# Patient Record
Sex: Female | Born: 1985
Health system: Southern US, Community
[De-identification: ages and names within clinical notes are randomized; demographics above are authoritative.]

## PROBLEM LIST (undated history)

## (undated) DIAGNOSIS — I10 Essential (primary) hypertension: Secondary | ICD-10-CM

## (undated) DIAGNOSIS — R7303 Prediabetes: Secondary | ICD-10-CM

## (undated) HISTORY — PX: NO PAST SURGERIES: SHX2092

## (undated) HISTORY — DX: Morbid (severe) obesity due to excess calories: E66.01

---
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
## 2012-06-05 ENCOUNTER — Ambulatory Visit (INDEPENDENT_AMBULATORY_CARE_PROVIDER_SITE_OTHER): Payer: BC Managed Care – HMO | Admitting: Physician Assistant

## 2012-06-05 VITALS — BP 148/97 | HR 128 | Temp 102.7°F | Resp 16 | Ht 68.0 in | Wt 262.0 lb

## 2012-06-05 DIAGNOSIS — R509 Fever, unspecified: Secondary | ICD-10-CM

## 2012-06-05 DIAGNOSIS — J029 Acute pharyngitis, unspecified: Secondary | ICD-10-CM

## 2012-06-05 DIAGNOSIS — R030 Elevated blood-pressure reading, without diagnosis of hypertension: Secondary | ICD-10-CM

## 2012-06-05 LAB — POCT CBC
HCT, POC: 39.9 % (ref 37.7–47.9)
Lymph, poc: 2.7 (ref 0.6–3.4)
MCH, POC: 26 pg — AB (ref 27–31.2)
MCHC: 30.6 g/dL — AB (ref 31.8–35.4)
MCV: 84.9 fL (ref 80–97)
POC Granulocyte: 20.4 — AB (ref 2–6.9)
POC LYMPH PERCENT: 11.1 %L (ref 10–50)
RDW, POC: 17.3 %
WBC: 24.3 10*3/uL — AB (ref 4.6–10.2)

## 2012-06-05 LAB — POCT INFLUENZA A/B
Influenza A, POC: NEGATIVE
Influenza B, POC: NEGATIVE

## 2012-06-05 MED ORDER — IBUPROFEN 200 MG PO TABS: 400.0000 mg | ORAL_TABLET | Freq: Once | ORAL | Status: DC

## 2012-06-05 MED ORDER — AMOXICILLIN 875 MG PO TABS: 875.0000 mg | ORAL_TABLET | Freq: Two times a day (BID) | ORAL | Status: DC

## 2012-06-05 NOTE — Progress Notes (Signed)
   [address], [place] [address]   Phone [phone]  Subjective:    Patient ID: Dana Allison, female    DOB: [birth year], 27 y.o.   MRN: [id number]  HPI  Pt presents to clinic with 2 day h/o severe sore throat with fever and no congestion or cough.  She has myalgias and slight nausea. She has used no OTC medications, she did have 2 left pills of antibiotic but does not know what they were.  She has not had a flu vaccine or exposure to anyone with the flu or strep that she knows of.  She is drinking a little today but has been unable to eat due to pain.  Review of Systems  Constitutional: Positive for fever and chills.  HENT: Positive for sore throat. Negative for congestion and rhinorrhea.   Respiratory: Negative for cough.   Musculoskeletal: Positive for myalgias.       Objective:   Physical Exam  Vitals reviewed. Constitutional: She appears well-developed and well-nourished.  HENT:  Head: Normocephalic and atraumatic.  Right Ear: Hearing, tympanic membrane, external ear and ear canal normal.  Left Ear: Hearing, tympanic membrane, external ear and ear canal normal.  Nose: Nose normal. No mucosal edema.  Mouth/Throat: Oropharyngeal exudate, posterior oropharyngeal edema and posterior oropharyngeal erythema present. No tonsillar abscesses.  Eyes: Conjunctivae normal are normal.  Cardiovascular: Normal rate, regular rhythm and normal heart sounds.   No murmur heard. Pulmonary/Chest: Effort normal and breath sounds normal.  Lymphadenopathy:    She has cervical adenopathy (AC enlarged and TTP).   Results for orders placed in visit on 06/05/12  POCT RAPID STREP A (OFFICE)      Component Value Range   Rapid Strep A Screen Negative  Negative  POCT CBC      Component Value Range   WBC 24.3 (*) 4.6 - 10.2 K/uL   Lymph, poc 2.7  0.6 - 3.4   POC LYMPH PERCENT 11.1  10 - 50 %L   MID (cbc) 1.2 (*) 0 - 0.9   POC MID % 5.1  0 - 12 %M   POC Granulocyte 20.4 (*) 2 - 6.9   Granulocyte percent 83.8 (*) 37 - 80 %G   RBC 4.70  4.04 - 5.48 M/uL   Hemoglobin 12.2  12.2 - 16.2 g/dL   HCT, POC 84.1  32.4 - 47.9 %   MCV 84.9  80 - 97 fL   MCH, POC 26.0 (*) 27 - 31.2 pg   MCHC 30.6 (*) 31.8 - 35.4 g/dL   RDW, POC 40.1     Platelet Count, POC 285  142 - 424 K/uL   MPV 8.5  0 - 99.8 fL       Assessment & Plan:   1. Acute pharyngitis  POCT rapid strep A, POCT Influenza A/B, amoxicillin (AMOXIL) 875 MG tablet  2. Fever, unspecified  POCT rapid strep A, POCT CBC, ibuprofen (ADVIL,MOTRIN) tablet 400 mg   I suspect that patient has strep throat and will treat accordingly due to exam and elevated white count with granulocytic shift.  D/w pt symptomatic treatment.  Gave note for work for tomorrow.  Pt must try to push fluids.  If she is unable to drink she must RTC of IV hydration.  Answered patient's questions.  Pt to monitor her BP - it is elevated tonight but probably because of her feeling poorly.

## 2012-06-08 ENCOUNTER — Emergency Department (HOSPITAL_COMMUNITY)
Admission: EM | Admit: 2012-06-08 | Discharge: 2012-06-09 | Disposition: A | Discharge status: Home / Self Care | Payer: BC Managed Care – PPO | Attending: Emergency Medicine | Admitting: Emergency Medicine

## 2012-06-08 DIAGNOSIS — Z792 Long term (current) use of antibiotics: Secondary | ICD-10-CM | POA: Insufficient documentation

## 2012-06-08 DIAGNOSIS — F17200 Nicotine dependence, unspecified, uncomplicated: Secondary | ICD-10-CM | POA: Insufficient documentation

## 2012-06-08 DIAGNOSIS — H109 Unspecified conjunctivitis: Secondary | ICD-10-CM

## 2012-06-08 DIAGNOSIS — H5789 Other specified disorders of eye and adnexa: Secondary | ICD-10-CM | POA: Insufficient documentation

## 2012-06-08 MED ORDER — POLYMYXIN B-TRIMETHOPRIM 10000-0.1 UNIT/ML-% OP SOLN: 1.0000 [drp] | OPHTHALMIC | Status: DC

## 2012-06-08 NOTE — ED Provider Notes (Signed)
History  This chart was scribed for non-physician practitioner working with Dana Smitty Cords, MD by Erskine Emery, ED Scribe. This patient was seen in room WTR5/WTR5 and the patient's care was started at 23:37.   CSN: [id number]  Arrival date & time 06/08/12  2238   First MD Initiated Contact with Patient 06/08/12 2337      Chief Complaint  Patient presents with  . Eye Problem    (Consider location/radiation/quality/duration/timing/severity/associated sxs/prior Treatment) Dana Allison is a 27 y.o. female who presents to the Emergency Department complaining of gradually worsening left eye drainage, erythema, and irritation for the past 2 days.  Patient is a 27 y.o. female presenting with eye problem. The history is provided by the patient. No language interpreter was used.  Eye Problem  This is a new problem. The current episode started 2 days ago. The problem occurs constantly. The problem has been gradually worsening. The left eye is affected.There was no injury mechanism. The pain is mild. There is no history of trauma to the eye. There is no known exposure to pink eye. She does not wear contacts. Associated symptoms include discharge and eye redness. Pertinent negatives include no blurred vision, no decreased vision, no double vision, no foreign body sensation, no photophobia, no nausea, no vomiting and no weakness. Treatments tried: Visine eye drops. The treatment provided no relief.  Pt denies any associated eye pain, foreign body in the eye, and blurred or double vision. She reports she has recently been taking amoxicillin for a cold and possible strep throat. She also reports using visine eye drops for her eye. Pt knows of no exposure to pink eye.  No past medical history on file.  No past surgical history on file.  Family History  Problem Relation Age of Onset  . Hypertension Mother     History  Substance Use Topics  . Smoking status: Current Every Day Smoker -- 0.3  packs/day for 2 years  . Smokeless tobacco: Not on file  . Alcohol Use: No    OB History    Grav Para Term Preterm Abortions TAB SAB Ect Mult Living                  Review of Systems  Constitutional: Negative for fever and chills.  HENT: Negative for neck pain.   Eyes: Positive for discharge and redness. Negative for blurred vision, double vision, photophobia, pain and visual disturbance.  Respiratory: Negative for shortness of breath.   Gastrointestinal: Negative for nausea and vomiting.  Neurological: Negative for weakness.    Allergies  Review of patient's allergies indicates no known allergies.  Home Medications   Current Outpatient Rx  Name  Route  Sig  Dispense  Refill  . ACETAMINOPHEN 500 MG PO TABS   Oral   Take 1,000 mg by mouth every 6 (six) hours as needed.         . AMOXICILLIN 875 MG PO TABS   Oral   Take 875 mg by mouth 2 (two) times daily.           Triage Vitals: BP 143/92  Pulse 119  Temp 98 F (36.7 C) (Oral)  Resp 18  SpO2 98%  LMP 06/04/2012  Physical Exam  Nursing note and vitals reviewed. Constitutional: She is oriented to person, place, and time. She appears well-developed and well-nourished. No distress.  HENT:  Head: Normocephalic and atraumatic.  Eyes: EOM are normal. Pupils are equal, round, and reactive to light.  No periorbital swelling or erythema of either eye. Sclera on left eye appears to be diffusely injected. Clear drainage and crusting of lateral edge of left eye. Right eye is clear. No pain with extraocular movements.  Neck: Neck supple. No tracheal deviation present.  Cardiovascular: Normal rate, regular rhythm and normal heart sounds.   Pulmonary/Chest: Effort normal and breath sounds normal. No respiratory distress. She has no wheezes.  Abdominal: Soft. She exhibits no distension.  Musculoskeletal: Normal range of motion. She exhibits no edema.  Neurological: She is alert and oriented to person, place, and  time.  Skin: Skin is warm and dry.  Psychiatric: She has a normal mood and affect.    ED Course  Procedures (including critical care time) DIAGNOSTIC STUDIES: Oxygen Saturation is 98% on room air, normal by my interpretation.    COORDINATION OF CARE: 23:42--I evaluated the patient and we discussed a treatment plan including eye drops to which the pt agreed.  I told the pt that she should look out for swelling or redness around her eye, vision changes, and pain upon moving her eye.  23: 54--I rechecked the pt and inspected her eye for any foreign bodies. There are no foreign bodies present. Discussed performing a Flurosein stain to check for corneal abrasions.  Patient states that she is not having pain and does not feel that there was a foreign body.    Labs Reviewed - No data to display No results found.   No diagnosis found.    MDM  Patient presenting with drainage and erythema of the left eye.  Patient does not have any periorbital erythema or edema.  Patient denies pain of her eye or foreign body sensation.  Normal visual acuity.  Appearance of the eye and discharge most consistent with Acute Bacterial Conjunctivitis.  Patient given prescription for antibiotic eye drops.  Return precautions discussed with the patient.    I personally performed the services described in this documentation, which was scribed in my presence. The recorded information has been reviewed and is accurate.      Pascal Lux [place], PA-C 06/09/12 1130

## 2012-06-08 NOTE — ED Notes (Signed)
Pt c/o L eye pain and drainage for 2 days. Pt states pain and drainage getting worse. Pt has redness to L eye. Pt reports that she is taking amoxicillin which she started on Thursday.

## 2012-06-11 ENCOUNTER — Telehealth: Payer: Self-pay

## 2012-06-11 ENCOUNTER — Emergency Department (HOSPITAL_COMMUNITY)
Admission: EM | Admit: 2012-06-11 | Discharge: 2012-06-11 | Disposition: A | Discharge status: Home / Self Care | Payer: BC Managed Care – PPO | Attending: Emergency Medicine | Admitting: Emergency Medicine

## 2012-06-11 ENCOUNTER — Encounter (HOSPITAL_COMMUNITY): Payer: Self-pay | Admitting: *Deleted

## 2012-06-11 DIAGNOSIS — F17200 Nicotine dependence, unspecified, uncomplicated: Secondary | ICD-10-CM | POA: Insufficient documentation

## 2012-06-11 DIAGNOSIS — Z792 Long term (current) use of antibiotics: Secondary | ICD-10-CM | POA: Insufficient documentation

## 2012-06-11 DIAGNOSIS — H109 Unspecified conjunctivitis: Secondary | ICD-10-CM | POA: Insufficient documentation

## 2012-06-11 DIAGNOSIS — I10 Essential (primary) hypertension: Secondary | ICD-10-CM | POA: Insufficient documentation

## 2012-06-11 NOTE — ED Provider Notes (Signed)
History  Scribed for Johnnette Gourd, PA-C/ Flint Melter, MD, the patient was seen in room WTR6/WTR6. This chart was scribed by Candelaria Stagers. The patient's care started at 9:28 PM   CSN: [id number]  Arrival date & time 06/11/12  1945   First MD Initiated Contact with Patient 06/11/12 2122      Chief Complaint  Patient presents with  . Eye Drainage  . Eye Problem     The history is provided by the patient. No language interpreter was used.   Dana Allison is a 27 y.o. female who presents to the Emergency Department complaining of redness and drainage from the right eye that started yesterday.  Pt was seen two days ago and diagnosed with conjunctivitis of the left eye and was given antibiotic drops, which she has used.  She reports the left eye has improved.  Pt denies fever or chills.  She has washed her pillow cases everyday.  She has applied warm compresses.  Pt reports she has eye drops left.  She needs a note for work.      History reviewed. No pertinent past medical history.  History reviewed. No pertinent past surgical history.  Family History  Problem Relation Age of Onset  . Hypertension Mother     History  Substance Use Topics  . Smoking status: Current Every Day Smoker -- 0.3 packs/day for 2 years  . Smokeless tobacco: Not on file  . Alcohol Use: Yes     Comment: occasionally    OB History    Grav Para Term Preterm Abortions TAB SAB Ect Mult Living                  Review of Systems  Constitutional: Negative for fever and chills.  Eyes: Positive for discharge (right eye drainage) and redness (right eye redness).  Respiratory: Negative for shortness of breath.   Gastrointestinal: Negative for nausea and vomiting.  Neurological: Negative for weakness.  All other systems reviewed and are negative.    Allergies  Review of patient's allergies indicates no known allergies.  Home Medications   Current Outpatient Rx  Name  Route  Sig  Dispense  Refill    . ACETAMINOPHEN 500 MG PO TABS   Oral   Take 1,000 mg by mouth every 6 (six) hours as needed. For pain.         Marland Kitchen AMOXICILLIN 875 MG PO TABS   Oral   Take 875 mg by mouth 2 (two) times daily.         Marland Kitchen POLYMYXIN B-TRIMETHOPRIM 10000-0.1 UNIT/ML-% OP SOLN   Left Eye   Place 1 drop into the left eye every 4 (four) hours.   10 mL   0     Use for seven days     BP 136/97  Pulse 96  Temp 98.6 F (37 C) (Oral)  Resp 16  SpO2 99%  LMP 06/04/2012  Physical Exam  Nursing note and vitals reviewed. Constitutional: She is oriented to person, place, and time. She appears well-developed and well-nourished. No distress.  HENT:  Head: Normocephalic and atraumatic.  Eyes: EOM are normal. Right eye exhibits discharge. Left eye exhibits no discharge. Right conjunctiva is injected. Left conjunctiva is not injected.          Neck: Neck supple. No tracheal deviation present.  Cardiovascular: Normal rate and regular rhythm.   Pulmonary/Chest: Effort normal. No respiratory distress. She has no wheezes. She has no rales.  Musculoskeletal: Normal range  of motion.  Neurological: She is alert and oriented to person, place, and time.  Skin: Skin is warm and dry.  Psychiatric: She has a normal mood and affect. Her behavior is normal.    ED Course  Procedures   DIAGNOSTIC STUDIES: Oxygen Saturation is 99% on room air, normal by my interpretation.    COORDINATION OF CARE:     Labs Reviewed - No data to display No results found.   1. Bacterial conjunctivitis       MDM  27 y/o female with bacterial conjunctivitis. She has almost a whole bottle of polytrim opth solution prescribed the other day for her other eye. Advised her to wash pillow case daily. Infection care and precautions discussed. Patient came in for work note. Return precautions discussed. Patient states understanding of plan and is agreeable.   I personally performed the services described in this documentation,  which was scribed in my presence. The recorded information has been reviewed and is accurate.         Trevor Mace, PA-C 06/11/12 2137

## 2012-06-11 NOTE — ED Provider Notes (Signed)
Medical screening examination/treatment/procedure(s) were performed by non-physician practitioner and as supervising physician I was immediately available for consultation/collaboration.   Flint Melter, MD 06/11/12 [phone]

## 2012-06-11 NOTE — Telephone Encounter (Signed)
PT STATES SHE STILL HAVE THE PINK EYE AND WOULD LIKE TO HAVE AN OOW NOTE FOR TODAY AND TOMORROW PLEASE CALL [phone]

## 2012-06-11 NOTE — ED Notes (Signed)
Pt reports being seen here x2 days ago for pink eye in left eye, pt given antibiotics eye drops to use. Pt presents tonight w/ redness and drainage from rt eye.

## 2012-06-11 NOTE — Telephone Encounter (Signed)
Was here on 06/05/12, if she needs work note for today and tomorrow she will need to be seen. She will come in today.

## 2012-06-12 NOTE — ED Provider Notes (Signed)
Medical screening examination/treatment/procedure(s) were performed by non-physician practitioner and as supervising physician I was immediately available for consultation/collaboration.  Mickell Birdwell K Nanna Ertle-Rasch, MD 06/12/12 2335 

## 2012-06-19 DIAGNOSIS — Z0271 Encounter for disability determination: Secondary | ICD-10-CM

## 2012-08-27 ENCOUNTER — Emergency Department (HOSPITAL_COMMUNITY)
Admission: EM | Admit: 2012-08-27 | Discharge: 2012-08-27 | Disposition: A | Discharge status: Home / Self Care | Payer: BC Managed Care – PPO | Attending: Emergency Medicine | Admitting: Emergency Medicine

## 2012-08-27 ENCOUNTER — Encounter (HOSPITAL_COMMUNITY): Payer: Self-pay | Admitting: Emergency Medicine

## 2012-08-27 ENCOUNTER — Emergency Department (HOSPITAL_COMMUNITY): Payer: BC Managed Care – PPO

## 2012-08-27 DIAGNOSIS — R112 Nausea with vomiting, unspecified: Secondary | ICD-10-CM

## 2012-08-27 DIAGNOSIS — E876 Hypokalemia: Secondary | ICD-10-CM | POA: Insufficient documentation

## 2012-08-27 DIAGNOSIS — R509 Fever, unspecified: Secondary | ICD-10-CM | POA: Insufficient documentation

## 2012-08-27 DIAGNOSIS — R1013 Epigastric pain: Secondary | ICD-10-CM | POA: Insufficient documentation

## 2012-08-27 DIAGNOSIS — R52 Pain, unspecified: Secondary | ICD-10-CM | POA: Insufficient documentation

## 2012-08-27 DIAGNOSIS — Z3202 Encounter for pregnancy test, result negative: Secondary | ICD-10-CM | POA: Insufficient documentation

## 2012-08-27 DIAGNOSIS — IMO0001 Reserved for inherently not codable concepts without codable children: Secondary | ICD-10-CM | POA: Insufficient documentation

## 2012-08-27 DIAGNOSIS — R197 Diarrhea, unspecified: Secondary | ICD-10-CM

## 2012-08-27 LAB — POCT PREGNANCY, URINE: Preg Test, Ur: NEGATIVE

## 2012-08-27 LAB — CBC WITH DIFFERENTIAL/PLATELET
Basophils Absolute: 0 10*3/uL (ref 0.0–0.1)
Eosinophils Absolute: 0 10*3/uL (ref 0.0–0.7)
Eosinophils Relative: 0 % (ref 0–5)
HCT: 35.6 % — ABNORMAL LOW (ref 36.0–46.0)
MCH: 26.1 pg (ref 26.0–34.0)
MCHC: 32.3 g/dL (ref 30.0–36.0)
MCV: 80.7 fL (ref 78.0–100.0)
Monocytes Absolute: 0.7 10*3/uL (ref 0.1–1.0)
Platelets: 270 10*3/uL (ref 150–400)
RDW: 16.3 % — ABNORMAL HIGH (ref 11.5–15.5)

## 2012-08-27 LAB — COMPREHENSIVE METABOLIC PANEL
ALT: 20 U/L (ref 0–35)
AST: 17 U/L (ref 0–37)
Calcium: 9.2 mg/dL (ref 8.4–10.5)
Creatinine, Ser: 0.71 mg/dL (ref 0.50–1.10)
GFR calc Af Amer: >90 mL/min (ref >90)
GFR calc non Af Amer: >90 mL/min (ref >90)
Sodium: 135 mEq/L (ref 135–145)
Total Protein: 7.9 g/dL (ref 6.0–8.3)

## 2012-08-27 MED ORDER — GI COCKTAIL ~~LOC~~
30.0000 mL | Freq: Once | ORAL | Status: AC
  Administered 2012-08-27: 30 mL via ORAL
  Filled 2012-08-27: qty 30

## 2012-08-27 MED ORDER — ONDANSETRON HCL 4 MG/2ML IJ SOLN
4.0000 mg | Freq: Once | INTRAMUSCULAR | Status: AC
  Administered 2012-08-27: 4 mg via INTRAVENOUS
  Filled 2012-08-27: qty 2

## 2012-08-27 MED ORDER — SODIUM CHLORIDE 0.9 % IV BOLUS (SEPSIS)
1000.0000 mL | Freq: Once | INTRAVENOUS | Status: AC
  Administered 2012-08-27: 1000 mL via INTRAVENOUS

## 2012-08-27 MED ORDER — POTASSIUM CHLORIDE CRYS ER 20 MEQ PO TBCR
40.0000 meq | EXTENDED_RELEASE_TABLET | Freq: Once | ORAL | Status: AC
  Administered 2012-08-27: 40 meq via ORAL
  Filled 2012-08-27: qty 2

## 2012-08-27 MED ORDER — METOCLOPRAMIDE HCL 10 MG PO TABS: 10.0000 mg | ORAL_TABLET | Freq: Four times a day (QID) | ORAL | Status: DC | PRN

## 2012-08-27 NOTE — ED Notes (Signed)
Pt c/o of generalized body aches, n/v x 1 day, started about 0400 that morning.

## 2012-08-27 NOTE — Progress Notes (Signed)
[facility] ED CM noted pt with coverage but no pcp listed [facility] ED CM spoke with pt on how to obtain an in network pcp with insurance coverage via the customer service number or web site   

## 2012-08-27 NOTE — ED Provider Notes (Signed)
History     CSN: [id number]  Arrival date & time 08/27/12  0912   First MD Initiated Contact with Patient 08/27/12 0915      Chief Complaint  Patient presents with  . Generalized Body Aches  . Nausea  . Emesis    (Consider location/radiation/quality/duration/timing/severity/associated sxs/prior treatment) Patient is a 27 y.o. female presenting with vomiting. The history is provided by the patient.  Emesis Severity:  Moderate Duration:  1 day Timing:  Constant Number of daily episodes:  1 Quality:  Unable to specify Progression:  Unchanged Chronicity:  New Recent urination:  Normal Context: not post-tussive and not self-induced   Relieved by:  Nothing Worsened by:  Nothing tried Ineffective treatments:  None tried Associated symptoms: abdominal pain, chills, diarrhea, fever and myalgias   Abdominal pain:    Location:  Epigastric   Quality:  Aching   Severity:  Severe   Onset quality:  Gradual   Duration:  1 day   Timing:  Constant   Progression:  Worsening   Chronicity:  New Diarrhea:    Quality:  Watery   Number of occurrences:  10   Severity:  Moderate   Duration:  1 day   Timing:  Constant   Progression:  Unchanged Fever:    Duration:  1 day   Timing:  Intermittent   Temp source:  Subjective   Progression:  Waxing and waning Myalgias:    Location:  Generalized   Quality:  Aching   Severity:  Moderate   Onset quality:  Gradual   Duration:  1 day   Timing:  Intermittent Risk factors: no alcohol use, no diabetes, not pregnant now, no prior abdominal surgery, no sick contacts, no suspect food intake and no travel to endemic areas     History reviewed. No pertinent past medical history.  History reviewed. No pertinent past surgical history.  No family history on file.  History  Substance Use Topics  . Smoking status: Never Smoker   . Smokeless tobacco: Not on file  . Alcohol Use: No    OB History   Grav Para Term Preterm Abortions TAB SAB Ect  Mult Living                  Review of Systems  Constitutional: Positive for chills.  Respiratory: Positive for cough and chest tightness. Negative for shortness of breath.   Cardiovascular: Positive for chest pain.  Gastrointestinal: Positive for vomiting, abdominal pain and diarrhea.  Endocrine: Negative for polyuria.  Genitourinary: Negative for dysuria and difficulty urinating.  Musculoskeletal: Positive for myalgias.  Neurological: Negative for syncope and weakness.    Allergies  Review of patient's allergies indicates no known allergies.  Home Medications  No current outpatient prescriptions on file.  BP 162/108  Pulse 107  Temp(Src) 98.8 F (37.1 C) (Oral)  Resp 16  SpO2 100%  LMP 06/17/2012  Physical Exam  Nursing note and vitals reviewed. Constitutional: She is oriented to person, place, and time. She appears well-developed and well-nourished. No distress.  HENT:  Head: Normocephalic and atraumatic.  Eyes: Conjunctivae and EOM are normal.  Cardiovascular: Normal rate and regular rhythm.   Pulmonary/Chest: Effort normal and breath sounds normal. No stridor. No respiratory distress.  Abdominal: She exhibits no distension. There is no hepatosplenomegaly. There is tenderness in the epigastric area. There is no rigidity, no rebound, no guarding and no CVA tenderness.  Musculoskeletal: She exhibits no edema.  Neurological: She is alert and oriented to person, place,  and time. No cranial nerve deficit.  Skin: Skin is warm and dry.  Psychiatric: She has a normal mood and affect.    ED Course  Procedures (including critical care time)  Labs Reviewed  CBC WITH DIFFERENTIAL  COMPREHENSIVE METABOLIC PANEL  LIPASE, BLOOD   No results found.    Date: 08/27/2012  Rate: 92  Rhythm: normal sinus rhythm  QRS Axis: normal  Intervals: normal  ST/T Wave abnormalities: normal  Conduction Disutrbances: none  Narrative Interpretation: unremarkable     No diagnosis  found.  11:26 AM Patient calm, drinking soda.    MDM  This young female presents with one day of generalized discomfort, nausea, vomiting, diarrhea.  On initial exam the patient is uncomfortable, mildly tachycardic, but in no distress.  Patient's soft abdomen with tenderness only in the epigastrium suggestive of GI etiology.  Absent fever, lower abdominal pain there is low suspicion for appendicitis, though this was discussed with the patient, including return precautions.  The patient improved while here, and her evaluation was largely unremarkable, including in unremarkable ECG, which coupled with the patient's low cardiac risk profile suggests no ongoing coronary ischemia.  The patient discharged in stable condition with antiemetics, dietary instructions.       Gerhard Munch, MD 08/27/12 1128

## 2012-08-27 NOTE — ED Notes (Signed)
MD Jeraldine Loots stated patient does not need to be on cardiac monitor (Patient's K+ 3.1)

## 2013-07-22 ENCOUNTER — Emergency Department (HOSPITAL_COMMUNITY): Payer: BC Managed Care – PPO

## 2013-07-22 ENCOUNTER — Encounter (HOSPITAL_COMMUNITY): Payer: Self-pay | Admitting: Emergency Medicine

## 2013-07-22 ENCOUNTER — Emergency Department (HOSPITAL_COMMUNITY)
Admission: EM | Admit: 2013-07-22 | Discharge: 2013-07-22 | Disposition: A | Discharge status: Home / Self Care | Payer: BC Managed Care – PPO | Attending: Emergency Medicine | Admitting: Emergency Medicine

## 2013-07-22 DIAGNOSIS — R1013 Epigastric pain: Secondary | ICD-10-CM | POA: Insufficient documentation

## 2013-07-22 DIAGNOSIS — E669 Obesity, unspecified: Secondary | ICD-10-CM | POA: Insufficient documentation

## 2013-07-22 DIAGNOSIS — R197 Diarrhea, unspecified: Secondary | ICD-10-CM | POA: Insufficient documentation

## 2013-07-22 DIAGNOSIS — R1011 Right upper quadrant pain: Secondary | ICD-10-CM | POA: Insufficient documentation

## 2013-07-22 DIAGNOSIS — Z3202 Encounter for pregnancy test, result negative: Secondary | ICD-10-CM | POA: Insufficient documentation

## 2013-07-22 DIAGNOSIS — R319 Hematuria, unspecified: Secondary | ICD-10-CM | POA: Insufficient documentation

## 2013-07-22 DIAGNOSIS — R109 Unspecified abdominal pain: Secondary | ICD-10-CM

## 2013-07-22 LAB — CBC WITH DIFFERENTIAL/PLATELET
BASOS PCT: 0 % (ref 0–1)
Basophils Absolute: 0 10*3/uL (ref 0.0–0.1)
EOS ABS: 0.1 10*3/uL (ref 0.0–0.7)
Eosinophils Relative: 1 % (ref 0–5)
HCT: 33.4 % — ABNORMAL LOW (ref 36.0–46.0)
HEMOGLOBIN: 10.9 g/dL — AB (ref 12.0–15.0)
Lymphocytes Relative: 24 % (ref 12–46)
Lymphs Abs: 2.6 10*3/uL (ref 0.7–4.0)
MCH: 28 pg (ref 26.0–34.0)
MCHC: 32.6 g/dL (ref 30.0–36.0)
MCV: 85.9 fL (ref 78.0–100.0)
MONO ABS: 0.8 10*3/uL (ref 0.1–1.0)
MONOS PCT: 7 % (ref 3–12)
NEUTROS PCT: 69 % (ref 43–77)
Neutro Abs: 7.6 10*3/uL (ref 1.7–7.7)
Platelets: 274 10*3/uL (ref 150–400)
RBC: 3.89 MIL/uL (ref 3.87–5.11)
RDW: 15.7 % — ABNORMAL HIGH (ref 11.5–15.5)
WBC: 11.1 10*3/uL — ABNORMAL HIGH (ref 4.0–10.5)

## 2013-07-22 LAB — URINE MICROSCOPIC-ADD ON

## 2013-07-22 LAB — URINALYSIS, ROUTINE W REFLEX MICROSCOPIC
Bilirubin Urine: NEGATIVE
GLUCOSE, UA: NEGATIVE mg/dL
Ketones, ur: NEGATIVE mg/dL
LEUKOCYTES UA: NEGATIVE
Nitrite: NEGATIVE
PH: 6.5 (ref 5.0–8.0)
Protein, ur: NEGATIVE mg/dL
Specific Gravity, Urine: 1.027 (ref 1.005–1.030)
Urobilinogen, UA: 1 mg/dL (ref 0.0–1.0)

## 2013-07-22 LAB — COMPREHENSIVE METABOLIC PANEL
ALBUMIN: 3.7 g/dL (ref 3.5–5.2)
ALT: 44 U/L — ABNORMAL HIGH (ref 0–35)
AST: 31 U/L (ref 0–37)
Alkaline Phosphatase: 75 U/L (ref 39–117)
BUN: 7 mg/dL (ref 6–23)
CALCIUM: 9.2 mg/dL (ref 8.4–10.5)
CO2: 25 mEq/L (ref 19–32)
CREATININE: 0.68 mg/dL (ref 0.50–1.10)
Chloride: 103 mEq/L (ref 96–112)
GFR calc Af Amer: >90 mL/min (ref >90)
GFR calc non Af Amer: >90 mL/min (ref >90)
Glucose, Bld: 95 mg/dL (ref 70–99)
POTASSIUM: 3.5 meq/L — AB (ref 3.7–5.3)
Sodium: 141 mEq/L (ref 137–147)
TOTAL PROTEIN: 8 g/dL (ref 6.0–8.3)
Total Bilirubin: <0.2 mg/dL — ABNORMAL LOW (ref 0.3–1.2)

## 2013-07-22 LAB — LIPASE, BLOOD: Lipase: 24 U/L (ref 11–59)

## 2013-07-22 LAB — POC URINE PREG, ED: PREG TEST UR: NEGATIVE

## 2013-07-22 MED ORDER — SODIUM CHLORIDE 0.9 % IV BOLUS (SEPSIS)
1000.0000 mL | Freq: Once | INTRAVENOUS | Status: AC
  Administered 2013-07-22: 1000 mL via INTRAVENOUS

## 2013-07-22 MED ORDER — HYDROMORPHONE HCL PF 1 MG/ML IJ SOLN
1.0000 mg | Freq: Once | INTRAMUSCULAR | Status: AC
  Administered 2013-07-22: 1 mg via INTRAVENOUS
  Filled 2013-07-22: qty 1

## 2013-07-22 NOTE — Progress Notes (Signed)
   CARE MANAGEMENT ED NOTE 07/22/2013  Patient:  Dana Allison,Dana Allison   Account Number:  [id number]  Date Initiated:  07/22/2013  Documentation initiated by:  Radford PaxFERRERO,Chandlar Staebell  Subjective/Objective Assessment:   Patient presents to Ed with abdominal pain     Subjective/Objective Assessment Detail:     Action/Plan:   Action/Plan Detail:   Anticipated DC Date:  07/22/2013     Status Recommendation to Physician:   Result of Recommendation:      DC Planning Services  Other  PCP issues    Choice offered to / List presented to:            Status of service:  Completed, signed off  ED Comments:   ED Comments Detail:  Patient confirmed she does not have a pcp.  EDCM instructedpatient to call the phone number on the back of her insurance card or go to insurance company website to help her find a physician who is close to her and within network.  EDCM explained importance of finding a pcp. Patient verbalized understanding.  No further EDCM needs at this time.

## 2013-07-22 NOTE — ED Provider Notes (Signed)
CSN: [id number]     Arrival date & time 07/22/13  1750 History   First MD Initiated Contact with Patient 07/22/13 1912     Chief Complaint  Patient presents with  . Abdominal Pain     (Consider location/radiation/quality/duration/timing/severity/associated sxs/prior Treatment) HPI Comments: 28 year old female presents with abdominal pain intermittently throughout the day. She states she woke up and had diarrhea for about 30 minutes with loose watery stools. Since then she's been having intermittent right upper quadrant and epigastric abdominal pain. She states this pain comes and goes. When last and last about 5 minutes. She is a hard time describing what feels like. No radiation to her back and no back pain. No nausea, vomiting. Has not tried to eat much and she is unsure food makes it worse. She describes the pain at this very moment as a 5/10 when it comes on worse at 8/10. She is not on her period is not having any vaginal discharge or abnormal vaginal bleeding. She states she did notice hematuria the most recent time she urinated. No dysuria.   History reviewed. No pertinent past medical history. History reviewed. No pertinent past surgical history. No family history on file. History  Substance Use Topics  . Smoking status: Never Smoker   . Smokeless tobacco: Not on file  . Alcohol Use: No   OB History   Grav Para Term Preterm Abortions TAB SAB Ect Mult Living                 Review of Systems  Constitutional: Negative for fever.  Respiratory: Negative for cough.   Gastrointestinal: Positive for abdominal pain and diarrhea. Negative for nausea and vomiting.  Genitourinary: Positive for hematuria. Negative for dysuria, vaginal bleeding, vaginal discharge and vaginal pain.  All other systems reviewed and are negative.      Allergies  Review of patient's allergies indicates no known allergies.  Home Medications  No current outpatient prescriptions on file. BP 146/105   Pulse 93  Temp(Src) 97.9 F (36.6 C) (Oral)  Resp 20  SpO2 100%  LMP 06/23/2013 Physical Exam  Vitals reviewed. Constitutional: She is oriented to person, place, and time. She appears well-developed and well-nourished. No distress.  Obese  HENT:  Head: Normocephalic and atraumatic.  Right Ear: External ear normal.  Left Ear: External ear normal.  Nose: Nose normal.  Eyes: Right eye exhibits no discharge. Left eye exhibits no discharge.  Cardiovascular: Normal rate, regular rhythm and normal heart sounds.   Pulmonary/Chest: Effort normal and breath sounds normal.  Abdominal: Soft. There is tenderness in the right upper quadrant. There is no CVA tenderness.  No RLQ tenderness  Neurological: She is alert and oriented to person, place, and time.  Skin: Skin is warm and dry.    ED Course  Procedures (including critical care time) Labs Review Labs Reviewed  CBC WITH DIFFERENTIAL - Abnormal; Notable for the following:    WBC 11.1 (*)    Hemoglobin 10.9 (*)    HCT 33.4 (*)    RDW 15.7 (*)    All other components within normal limits  COMPREHENSIVE METABOLIC PANEL - Abnormal; Notable for the following:    Potassium 3.5 (*)    ALT 44 (*)    Total Bilirubin <0.2 (*)    All other components within normal limits  URINALYSIS, ROUTINE W REFLEX MICROSCOPIC - Abnormal; Notable for the following:    APPearance CLOUDY (*)    Hgb urine dipstick LARGE (*)    All  other components within normal limits  URINE MICROSCOPIC-ADD ON - Abnormal; Notable for the following:    Squamous Epithelial / LPF FEW (*)    All other components within normal limits  LIPASE, BLOOD  POC URINE PREG, ED   Imaging Review No results found.  EKG Interpretation   None       MDM   Final diagnoses:  Abdominal pain  Hematuria    Patient with benign exam. Given her right upper quadrant colicky symptoms abdominal ultrasound was obtained and shows no gallstones or hydronephrosis. She did have an enteritis  with her diarrhea she had this morning. However at this time she has no vomiting, significant pain, or vital sign abnormality that would indicate need for further workup and/or admission. I feel she is stable for discharge and will followup with her PCP. As for her hematuria there is no obvious cause. She's not on her period. There is no sign of UTI. There is no back tenderness to suggest a kidney origin. We'll get her a followup with her PCP for a recheck of her urine.    Audree CamelScott T Lynnlee Revels, MD 07/22/13 [phone]

## 2013-07-22 NOTE — ED Notes (Signed)
Pt c/o abd pain off and on today, had little diarrhea this am but none since then. Denies n/v

## 2013-07-22 NOTE — Discharge Instructions (Signed)
Abdominal Pain, Women °Abdominal (stomach, pelvic, or belly) pain can be caused by many things. It is important to tell your doctor: °· The location of the pain. °· Does it come and go or is it present all the time? °· Are there things that start the pain (eating certain foods, exercise)? °· Are there other symptoms associated with the pain (fever, nausea, vomiting, diarrhea)? °All of this is helpful to know when trying to find the cause of the pain. °CAUSES  °· Stomach: virus or bacteria infection, or ulcer. °· Intestine: appendicitis (inflamed appendix), regional ileitis (Crohn's disease), ulcerative colitis (inflamed colon), irritable bowel syndrome, diverticulitis (inflamed diverticulum of the colon), or cancer of the stomach or intestine. °· Gallbladder disease or stones in the gallbladder. °· Kidney disease, kidney stones, or infection. °· Pancreas infection or cancer. °· Fibromyalgia (pain disorder). °· Diseases of the female organs: °· Uterus: fibroid (non-cancerous) tumors or infection. °· Fallopian tubes: infection or tubal pregnancy. °· Ovary: cysts or tumors. °· Pelvic adhesions (scar tissue). °· Endometriosis (uterus lining tissue growing in the pelvis and on the pelvic organs). °· Pelvic congestion syndrome (female organs filling up with blood just before the menstrual period). °· Pain with the menstrual period. °· Pain with ovulation (producing an egg). °· Pain with an IUD (intrauterine device, birth control) in the uterus. °· Cancer of the female organs. °· Functional pain (pain not caused by a disease, may improve without treatment). °· Psychological pain. °· Depression. °DIAGNOSIS  °Your doctor will decide the seriousness of your pain by doing an examination. °· Blood tests. °· X-rays. °· Ultrasound. °· CT scan (computed tomography, special type of X-ray). °· MRI (magnetic resonance imaging). °· Cultures, for infection. °· Barium enema (dye inserted in the large intestine, to better view it with  X-rays). °· Colonoscopy (looking in intestine with a lighted tube). °· Laparoscopy (minor surgery, looking in abdomen with a lighted tube). °· Major abdominal exploratory surgery (looking in abdomen with a large incision). °TREATMENT  °The treatment will depend on the cause of the pain.  °· Many cases can be observed and treated at home. °· Over-the-counter medicines recommended by your caregiver. °· Prescription medicine. °· Antibiotics, for infection. °· Birth control pills, for painful periods or for ovulation pain. °· Hormone treatment, for endometriosis. °· Nerve blocking injections. °· Physical therapy. °· Antidepressants. °· Counseling with a psychologist or psychiatrist. °· Minor or major surgery. °HOME CARE INSTRUCTIONS  °· Do not take laxatives, unless directed by your caregiver. °· Take over-the-counter pain medicine only if ordered by your caregiver. Do not take aspirin because it can cause an upset stomach or bleeding. °· Try a clear liquid diet (broth or water) as ordered by your caregiver. Slowly move to a bland diet, as tolerated, if the pain is related to the stomach or intestine. °· Have a thermometer and take your temperature several times a day, and record it. °· Bed rest and sleep, if it helps the pain. °· Avoid sexual intercourse, if it causes pain. °· Avoid stressful situations. °· Keep your follow-up appointments and tests, as your caregiver orders. °· If the pain does not go away with medicine or surgery, you may try: °· Acupuncture. °· Relaxation exercises (yoga, meditation). °· Group therapy. °· Counseling. °SEEK MEDICAL CARE IF:  °· You notice certain foods cause stomach pain. °· Your home care treatment is not helping your pain. °· You need stronger pain medicine. °· You want your IUD removed. °· You feel faint or   lightheaded. °· You develop nausea and vomiting. °· You develop a rash. °· You are having side effects or an allergy to your medicine. °SEEK IMMEDIATE MEDICAL CARE IF:  °· Your  pain does not go away or gets worse. °· You have a fever. °· Your pain is felt only in portions of the abdomen. The right side could possibly be appendicitis. The left lower portion of the abdomen could be colitis or diverticulitis. °· You are passing blood in your stools (bright red or black tarry stools, with or without vomiting). °· You have blood in your urine. °· You develop chills, with or without a fever. °· You pass out. °MAKE SURE YOU:  °· Understand these instructions. °· Will watch your condition. °· Will get help right away if you are not doing well or get worse. °Document Released: 03/12/2007 Document Revised: 08/07/2011 Document Reviewed: 04/01/2009 °[employer]® Patient Information ©2014 [employer]. ° °

## 2013-09-20 ENCOUNTER — Encounter (HOSPITAL_COMMUNITY): Payer: Self-pay | Admitting: Emergency Medicine

## 2013-09-20 ENCOUNTER — Emergency Department (HOSPITAL_COMMUNITY)
Admission: EM | Admit: 2013-09-20 | Discharge: 2013-09-20 | Disposition: A | Discharge status: Home / Self Care | Payer: BC Managed Care – PPO | Attending: Emergency Medicine | Admitting: Emergency Medicine

## 2013-09-20 DIAGNOSIS — H748X9 Other specified disorders of middle ear and mastoid, unspecified ear: Secondary | ICD-10-CM | POA: Insufficient documentation

## 2013-09-20 DIAGNOSIS — H9209 Otalgia, unspecified ear: Secondary | ICD-10-CM

## 2013-09-20 DIAGNOSIS — F17200 Nicotine dependence, unspecified, uncomplicated: Secondary | ICD-10-CM | POA: Insufficient documentation

## 2013-09-20 MED ORDER — IBUPROFEN 600 MG PO TABS: 600.0000 mg | ORAL_TABLET | Freq: Three times a day (TID) | ORAL | Status: DC | PRN

## 2013-09-20 MED ORDER — IBUPROFEN 200 MG PO TABS
600.0000 mg | ORAL_TABLET | Freq: Once | ORAL | Status: AC
  Administered 2013-09-20: 600 mg via ORAL
  Filled 2013-09-20: qty 3

## 2013-09-20 MED ORDER — ANTIPYRINE-BENZOCAINE 5.4-1.4 % OT SOLN
3.0000 [drp] | Freq: Once | OTIC | Status: AC
  Administered 2013-09-20: 4 [drp] via OTIC
  Filled 2013-09-20: qty 10

## 2013-09-20 NOTE — ED Notes (Signed)
Pt from c/o R ear pain x2 day. Pt denies other s/sx. Pt is A&O and in NAD

## 2013-09-20 NOTE — ED Provider Notes (Signed)
CSN: [id number]     Arrival date & time 09/20/13  0908 History   First MD Initiated Contact with Patient 09/20/13 [phone]     Chief Complaint  Patient presents with  . Otalgia     HPI Patient presents to the emergency department for increasing right ear pain over the past 2 days.  She's had no significant upper respiratory symptoms.  She has not tried any pain medication at home for her pain.  She does smoke cigarettes.  No sore throat.  No dental pain.  Pain is mild to moderate in severity.  No headache.   History reviewed. No pertinent past medical history. History reviewed. No pertinent past surgical history. Family History  Problem Relation Age of Onset  . Hypertension Mother    History  Substance Use Topics  . Smoking status: Current Every Day Smoker -- 0.30 packs/day for 2 years  . Smokeless tobacco: Not on file  . Alcohol Use: Yes     Comment: occasionally   OB History   Grav Para Term Preterm Abortions TAB SAB Ect Mult Living                 Review of Systems  All other systems reviewed and are negative.     Allergies  Review of patient's allergies indicates no known allergies.  Home Medications   Prior to Admission medications   Medication Sig Start Date End Date Taking? Authorizing Provider  ibuprofen (ADVIL,MOTRIN) 200 MG tablet Take 600 mg by mouth every 6 (six) hours as needed for moderate pain.   Yes Historical Provider, MD  ibuprofen (ADVIL,MOTRIN) 600 MG tablet Take 1 tablet (600 mg total) by mouth every 8 (eight) hours as needed. 09/20/13   Lyanne CoKevin M Seaira Byus, MD   BP 151/103  Pulse 96  Temp(Src) 98.1 F (36.7 C) (Oral)  Resp 20  SpO2 100%  LMP 09/06/2013 Physical Exam  Nursing note and vitals reviewed. Constitutional: She is oriented to person, place, and time. She appears well-developed and well-nourished.  HENT:  Head: Normocephalic.  Right Ear: Hearing and external ear normal. No foreign bodies. Tympanic membrane is not injected, not perforated  and not erythematous. A middle ear effusion is present.  Left Ear: Hearing and external ear normal. No foreign bodies. Tympanic membrane is not injected, not perforated and not erythematous. A middle ear effusion is present.  Nose: Nose normal.  Mouth/Throat: Uvula is midline, oropharynx is clear and moist and mucous membranes are normal. Normal dentition. No dental caries.  Eyes: EOM are normal.  Neck: Normal range of motion.  Pulmonary/Chest: Effort normal.  Abdominal: She exhibits no distension.  Musculoskeletal: Normal range of motion.  Neurological: She is alert and oriented to person, place, and time.  Psychiatric: She has a normal mood and affect.    ED Course  Procedures (including critical care time) Labs Review Labs Reviewed - No data to display  Imaging Review No results found.   EKG Interpretation None      MDM   Final diagnoses:  Otalgia        Lyanne CoKevin M Robyne Matar, MD 09/20/13 [phone]

## 2013-09-20 NOTE — ED Notes (Signed)
Bed: WTR5 Expected date:  Expected time:  Means of arrival:  Comments: EMS-boil on [place]

## 2013-09-20 NOTE — Discharge Instructions (Signed)
Otalgia °The most common reason for this in children is an infection of the middle ear. Pain from the middle ear is usually caused by a build-up of fluid and pressure behind the eardrum. Pain from an earache can be sharp, dull, or burning. The pain may be temporary or constant. The middle ear is connected to the nasal passages by a short narrow tube called the Eustachian tube. The Eustachian tube allows fluid to drain out of the middle ear, and helps keep the pressure in your ear equalized. °CAUSES  °A cold or allergy can block the Eustachian tube with inflammation and the build-up of secretions. This is especially likely in small children, because their Eustachian tube is shorter and more horizontal. When the Eustachian tube closes, the normal flow of fluid from the middle ear is stopped. Fluid can accumulate and cause stuffiness, pain, hearing loss, and an ear infection if germs start growing in this area. °SYMPTOMS  °The symptoms of an ear infection may include fever, ear pain, fussiness, increased crying, and irritability. Many children will have temporary and minor hearing loss during and right after an ear infection. Permanent hearing loss is rare, but the risk increases the more infections a child has. Other causes of ear pain include retained water in the outer ear canal from swimming and bathing. °Ear pain in adults is less likely to be from an ear infection. Ear pain may be referred from other locations. Referred pain may be from the joint between your jaw and the skull. It may also come from a tooth problem or problems in the neck. Other causes of ear pain include: °· A foreign body in the ear. °· Outer ear infection. °· Sinus infections. °· Impacted ear wax. °· Ear injury. °· Arthritis of the jaw or TMJ problems. °· Middle ear infection. °· Tooth infections. °· Sore throat with pain to the ears. °DIAGNOSIS  °Your caregiver can usually make the diagnosis by examining you. Sometimes other special studies,  including x-rays and lab work may be necessary. °TREATMENT  °· If antibiotics were prescribed, use them as directed and finish them even if you or your child's symptoms seem to be improved. °· Sometimes PE tubes are needed in children. These are little plastic tubes which are put into the eardrum during a simple surgical procedure. They allow fluid to drain easier and allow the pressure in the middle ear to equalize. This helps relieve the ear pain caused by pressure changes. °HOME CARE INSTRUCTIONS  °· Only take over-the-counter or prescription medicines for pain, discomfort, or fever as directed by your caregiver. DO NOT GIVE CHILDREN ASPIRIN because of the association of Reye's Syndrome in children taking aspirin. °· Use a cold pack applied to the outer ear for 15-20 minutes, 03-04 times per day or as needed may reduce pain. Do not apply ice directly to the skin. You may cause frost bite. °· Over-the-counter ear drops used as directed may be effective. Your caregiver may sometimes prescribe ear drops. °· Resting in an upright position may help reduce pressure in the middle ear and relieve pain. °· Ear pain caused by rapidly descending from high altitudes can be relieved by swallowing or chewing gum. Allowing infants to suck on a bottle during airplane travel can help. °· Do not smoke in the house or near children. If you are unable to quit smoking, smoke outside. °· Control allergies. °SEEK IMMEDIATE MEDICAL CARE IF:  °· You or your child are becoming sicker. °· Pain or fever   relief is not obtained with medicine. °· You or your child's symptoms (pain, fever, or irritability) do not improve within 24 to 48 hours or as instructed. °· Severe pain suddenly stops hurting. This may indicate a ruptured eardrum. °· You or your children develop new problems such as severe headaches, stiff neck, difficulty swallowing, or swelling of the face or around the ear. °Document Released: 12/31/2003 Document Revised: 08/07/2011  Document Reviewed: 05/06/2008 °[employer]® Patient Information ©2014 [employer]. ° °

## 2014-11-23 ENCOUNTER — Emergency Department (HOSPITAL_COMMUNITY)
Admission: EM | Admit: 2014-11-23 | Discharge: 2014-11-23 | Disposition: A | Discharge status: Home / Self Care | Payer: Self-pay | Attending: Emergency Medicine | Admitting: Emergency Medicine

## 2014-11-23 ENCOUNTER — Encounter (HOSPITAL_COMMUNITY): Payer: Self-pay | Admitting: Family Medicine

## 2014-11-23 DIAGNOSIS — R1013 Epigastric pain: Secondary | ICD-10-CM

## 2014-11-23 DIAGNOSIS — I10 Essential (primary) hypertension: Secondary | ICD-10-CM | POA: Insufficient documentation

## 2014-11-23 DIAGNOSIS — R110 Nausea: Secondary | ICD-10-CM | POA: Insufficient documentation

## 2014-11-23 DIAGNOSIS — Z202 Contact with and (suspected) exposure to infections with a predominantly sexual mode of transmission: Secondary | ICD-10-CM

## 2014-11-23 DIAGNOSIS — R197 Diarrhea, unspecified: Secondary | ICD-10-CM

## 2014-11-23 DIAGNOSIS — K529 Noninfective gastroenteritis and colitis, unspecified: Secondary | ICD-10-CM | POA: Insufficient documentation

## 2014-11-23 DIAGNOSIS — Z3202 Encounter for pregnancy test, result negative: Secondary | ICD-10-CM | POA: Insufficient documentation

## 2014-11-23 DIAGNOSIS — A5901 Trichomonal vulvovaginitis: Secondary | ICD-10-CM | POA: Insufficient documentation

## 2014-11-23 LAB — WET PREP, GENITAL: YEAST WET PREP: NONE SEEN

## 2014-11-23 LAB — CBC WITH DIFFERENTIAL/PLATELET
BASOS ABS: 0 10*3/uL (ref 0.0–0.1)
Basophils Relative: 0 % (ref 0–1)
EOS ABS: 0.1 10*3/uL (ref 0.0–0.7)
EOS PCT: 1 % (ref 0–5)
HCT: 32.9 % — ABNORMAL LOW (ref 36.0–46.0)
Hemoglobin: 10.4 g/dL — ABNORMAL LOW (ref 12.0–15.0)
LYMPHS ABS: 2.2 10*3/uL (ref 0.7–4.0)
LYMPHS PCT: 18 % (ref 12–46)
MCH: 25.3 pg — AB (ref 26.0–34.0)
MCHC: 31.6 g/dL (ref 30.0–36.0)
MCV: 80 fL (ref 78.0–100.0)
Monocytes Absolute: 0.7 10*3/uL (ref 0.1–1.0)
Monocytes Relative: 6 % (ref 3–12)
NEUTROS PCT: 75 % (ref 43–77)
Neutro Abs: 9 10*3/uL — ABNORMAL HIGH (ref 1.7–7.7)
Platelets: 315 10*3/uL (ref 150–400)
RBC: 4.11 MIL/uL (ref 3.87–5.11)
RDW: 16.9 % — ABNORMAL HIGH (ref 11.5–15.5)
WBC: 11.9 10*3/uL — AB (ref 4.0–10.5)

## 2014-11-23 LAB — COMPREHENSIVE METABOLIC PANEL
ALK PHOS: 73 U/L (ref 38–126)
ALT: 24 U/L (ref 14–54)
AST: 20 U/L (ref 15–41)
Albumin: 3.6 g/dL (ref 3.5–5.0)
Anion gap: 6 (ref 5–15)
BUN: 7 mg/dL (ref 6–20)
CALCIUM: 9.7 mg/dL (ref 8.9–10.3)
CO2: 27 mmol/L (ref 22–32)
Chloride: 105 mmol/L (ref 101–111)
Creatinine, Ser: 0.83 mg/dL (ref 0.44–1.00)
GFR calc Af Amer: >60 mL/min (ref >60)
GFR calc non Af Amer: >60 mL/min (ref >60)
Glucose, Bld: 99 mg/dL (ref 65–99)
POTASSIUM: 4.1 mmol/L (ref 3.5–5.1)
SODIUM: 138 mmol/L (ref 135–145)
TOTAL PROTEIN: 7.6 g/dL (ref 6.5–8.1)
Total Bilirubin: 0.2 mg/dL — ABNORMAL LOW (ref 0.3–1.2)

## 2014-11-23 LAB — URINALYSIS, ROUTINE W REFLEX MICROSCOPIC
BILIRUBIN URINE: NEGATIVE
Glucose, UA: NEGATIVE mg/dL
Hgb urine dipstick: NEGATIVE
KETONES UR: NEGATIVE mg/dL
LEUKOCYTES UA: NEGATIVE
Nitrite: NEGATIVE
PH: 6.5 (ref 5.0–8.0)
Protein, ur: NEGATIVE mg/dL
Specific Gravity, Urine: 1.023 (ref 1.005–1.030)
Urobilinogen, UA: 0.2 mg/dL (ref 0.0–1.0)

## 2014-11-23 LAB — LIPASE, BLOOD: Lipase: 17 U/L — ABNORMAL LOW (ref 22–51)

## 2014-11-23 LAB — POC URINE PREG, ED: PREG TEST UR: NEGATIVE

## 2014-11-23 MED ORDER — STERILE WATER FOR INJECTION IJ SOLN
1.5000 mL | Freq: Once | INTRAMUSCULAR | Status: AC
  Administered 2014-11-23: 1.5 mL via INTRAMUSCULAR

## 2014-11-23 MED ORDER — ONDANSETRON HCL 8 MG PO TABS: 8.0000 mg | ORAL_TABLET | Freq: Three times a day (TID) | ORAL | Status: DC | PRN

## 2014-11-23 MED ORDER — METRONIDAZOLE 500 MG PO TABS
2000.0000 mg | ORAL_TABLET | Freq: Once | ORAL | Status: AC
  Administered 2014-11-23: 2000 mg via ORAL
  Filled 2014-11-23: qty 4

## 2014-11-23 MED ORDER — FERROUS SULFATE 325 (65 FE) MG PO TABS: 325.0000 mg | ORAL_TABLET | Freq: Every day | ORAL | Status: DC

## 2014-11-23 MED ORDER — CEFTRIAXONE SODIUM 250 MG IJ SOLR
250.0000 mg | Freq: Once | INTRAMUSCULAR | Status: AC
  Administered 2014-11-23: 250 mg via INTRAMUSCULAR
  Filled 2014-11-23: qty 250

## 2014-11-23 MED ORDER — HYDROCODONE-ACETAMINOPHEN 5-325 MG PO TABS: 1.0000 | ORAL_TABLET | Freq: Four times a day (QID) | ORAL | Status: DC | PRN

## 2014-11-23 MED ORDER — ONDANSETRON 4 MG PO TBDP
8.0000 mg | ORAL_TABLET | Freq: Once | ORAL | Status: AC
  Administered 2014-11-23: 8 mg via ORAL
  Filled 2014-11-23: qty 2

## 2014-11-23 MED ORDER — AZITHROMYCIN 250 MG PO TABS
1000.0000 mg | ORAL_TABLET | Freq: Once | ORAL | Status: AC
  Administered 2014-11-23: 1000 mg via ORAL
  Filled 2014-11-23: qty 4

## 2014-11-23 MED ORDER — GI COCKTAIL ~~LOC~~
30.0000 mL | Freq: Once | ORAL | Status: AC
  Administered 2014-11-23: 30 mL via ORAL
  Filled 2014-11-23: qty 30

## 2014-11-23 NOTE — ED Notes (Signed)
Pelvic Cart at bedside 

## 2014-11-23 NOTE — ED Provider Notes (Addendum)
CSN: [id number]     Arrival date & time 11/23/14  1238 History   This chart was scribed for non-physician practitioner, Allen Derry, PA-C working with Benjiman Core, MD, by Abel Presto, ED Scribe. This patient was seen in room TR03C/TR03C and the patient's care was started at 1:25 PM.    Chief Complaint  Patient presents with  . Exposure to STD     Patient is a 29 y.o. female presenting with STD exposure and abdominal pain. The history is provided by the patient. No language interpreter was used.  Exposure to STD This is a new problem. The current episode started more than 1 week ago. The problem occurs rarely. The problem has not changed since onset.Associated symptoms include abdominal pain. Pertinent negatives include no chest pain and no shortness of breath. Nothing aggravates the symptoms. Nothing relieves the symptoms. She has tried nothing for the symptoms. The treatment provided no relief.  Abdominal Pain Pain location:  Epigastric Pain radiates to:  Does not radiate Pain severity:  Moderate Onset quality:  Sudden Duration:  9 hours Timing:  Constant Chronicity:  New Context: awakening from sleep and recent sexual activity   Context: not diet changes   Relieved by:  None tried Worsened by:  Nothing tried Ineffective treatments:  None tried Associated symptoms: diarrhea, flatus and nausea   Associated symptoms: no chest pain, no chills, no constipation, no dysuria, no fever, no hematochezia, no hematuria, no melena, no shortness of breath, no vaginal bleeding, no vaginal discharge and no vomiting   Diarrhea:    Quality:  Unable to specify   Number of occurrences:  7   Severity:  Moderate   Duration:  9 hours   Timing:  Intermittent   Progression:  Unchanged Nausea:    Severity:  Moderate   Onset quality:  Sudden   Duration:  9 hours   Timing:  Constant   Progression:  Unchanged   HPI Comments: Dana Allison is a 29 y.o. female with PMHx of HTN not  currently on medications, who presents to the Emergency Department complaining of 7/10 constant generalized abd pain with onset at 4 AM. Pt states the pain woke her from rest. She denies pain radiation. She denies any aggravating or alleviating factors. Pt notes associated loose diarrhea x 7 and nausea. Pt reports she is concerned that she has been exposed to an STD as a recent partner believes they have been infected. Pt is sexually active with two partners in last year, one of which was not protected. She denies suspicious foods, travel outside of the country, and recent sick contacts.  Pt denies fever, chills, chest pain, SOB, vomiting, constipation, melena, hematochezia, obstipation, vaginal bleeding, vaginal discharge, numbness, tingling, weakness, difficulty urinating, hematuria, or dysuria.   History reviewed. No pertinent past medical history. History reviewed. No pertinent past surgical history. History reviewed. No pertinent family history. History  Substance Use Topics  . Smoking status: Never Smoker   . Smokeless tobacco: Not on file  . Alcohol Use: No   OB History    No data available     Review of Systems  Constitutional: Negative for fever and chills.  Respiratory: Negative for shortness of breath.   Cardiovascular: Negative for chest pain.  Gastrointestinal: Positive for nausea, abdominal pain, diarrhea and flatus. Negative for vomiting, constipation, blood in stool, melena and hematochezia.  Genitourinary: Negative for dysuria, hematuria, vaginal bleeding, vaginal discharge and difficulty urinating.  Musculoskeletal: Negative for arthralgias and neck pain.  Skin: Negative  for color change.  Allergic/Immunologic: Negative for immunocompromised state.  Neurological: Negative for weakness and numbness.  Psychiatric/Behavioral: Negative for confusion.   10 Systems reviewed and are negative for acute change except as noted in the HPI.    Allergies  Review of patient's  allergies indicates no known allergies.  Home Medications   Prior to Admission medications   Not on File   BP 166/114 mmHg  Pulse 99  Temp(Src) 98.9 F (37.2 C) (Oral)  Resp 20  Ht  (1.727 m)  Wt 263 lb (119.296 kg)  BMI 40.00 kg/m2  SpO2 98%  LMP 10/12/2014 Physical Exam  Constitutional: She is oriented to person, place, and time. Vital signs are normal. She appears well-developed and well-nourished.  Non-toxic appearance. No distress.  Afebrile, nontoxic, NAD  HENT:  Head: Normocephalic and atraumatic.  Mouth/Throat: Oropharynx is clear and moist and mucous membranes are normal.  Eyes: Conjunctivae and EOM are normal. Right eye exhibits no discharge. Left eye exhibits no discharge.  Neck: Normal range of motion. Neck supple.  Cardiovascular: Normal rate, regular rhythm, normal heart sounds and intact distal pulses.  Exam reveals no gallop and no friction rub.   No murmur heard. Pulmonary/Chest: Effort normal and breath sounds normal. No respiratory distress. She has no decreased breath sounds. She has no wheezes. She has no rhonchi. She has no rales.  Abdominal: Soft. Normal appearance and bowel sounds are normal. She exhibits no distension. There is tenderness in the epigastric area. There is no rigidity, no rebound, no guarding, no CVA tenderness, no tenderness at McBurney's point and negative Murphy's sign.    Soft, nondistended, +BS throughout, with very mild epigastric tenderness, no r/g/r, neg murphy's, neg mcburney's, no CVA TTP   Genitourinary: Uterus normal. Pelvic exam was performed with patient supine. There is no rash, tenderness or lesion on the right labia. There is no rash, tenderness or lesion on the left labia. Cervix exhibits discharge and friability. Cervix exhibits no motion tenderness. Right adnexum displays no mass, no tenderness and no fullness. Left adnexum displays no mass, no tenderness and no fullness. No erythema or bleeding in the vagina. Vaginal  discharge found.  Chaperone present for exam. No rashes, lesions, or tenderness to external genitalia. No erythema, injury, or tenderness to vaginal mucosa. Yellowish vaginal discharge without bleeding within vaginal vault. No adnexal masses, tenderness, or fullness. No CMT, but some cervical friability and yellowish discharge from cervical os. Uterus non-deviated, mobile, nonTTP, and without enlargement.    Musculoskeletal: Normal range of motion.  Neurological: She is alert and oriented to person, place, and time. She has normal strength. No sensory deficit.  Skin: Skin is warm, dry and intact. No rash noted.  Psychiatric: She has a normal mood and affect.  Nursing note and vitals reviewed.   ED Course  Procedures (including critical care time) DIAGNOSTIC STUDIES: Oxygen Saturation is 98% on room air, normal by my interpretation.    COORDINATION OF CARE: 1:34 PM Discussed treatment plan with patient at beside, the patient agrees with the plan and has no further questions at this time.   Labs Review Labs Reviewed  WET PREP, GENITAL - Abnormal; Notable for the following:    Trich, Wet Prep FEW (*)    Clue Cells Wet Prep HPF POC FEW (*)    WBC, Wet Prep HPF POC FEW (*)    All other components within normal limits  CBC WITH DIFFERENTIAL/PLATELET - Abnormal; Notable for the following:    WBC 11.9 (*)  Hemoglobin 10.4 (*)    HCT 32.9 (*)    MCH 25.3 (*)    RDW 16.9 (*)    Neutro Abs 9.0 (*)    All other components within normal limits  LIPASE, BLOOD - Abnormal; Notable for the following:    Lipase 17 (*)    All other components within normal limits  COMPREHENSIVE METABOLIC PANEL - Abnormal; Notable for the following:    Total Bilirubin 0.2 (*)    All other components within normal limits  URINALYSIS, ROUTINE W REFLEX MICROSCOPIC (NOT AT [facility])  RPR  HIV ANTIBODY (ROUTINE TESTING)  POC URINE PREG, ED  GC/CHLAMYDIA PROBE AMP ([place]) NOT AT [facility]    Imaging Review No  results found.   EKG Interpretation None      MDM   Final diagnoses:  STD exposure  Trichomonas vaginalis (TV) infection  Nausea  Diarrhea  Epigastric pain  Gastroenteritis  HTN (hypertension), benign    29 y.o. female here with generalized abd pain and diarrhea onset today, some nausea without vomiting. Also here for STD check. Abd exam revealing mildly tender epigastrum, likely gastritis vs gastroenteritis. Will obtain labs, give zofran and GI cocktail, and reassess after pelvic exam. Of note, she has HTN here, stopped taking her meds a while ago, no symptoms today therefore doubt need for further work up but will need to have her f/up with [facility] to re-establish care.  4:01 PM No pelvic tenderness or CMT, doubt need for pelvic imaging. CBC with diff showing mildly elevated WBC c/w prior labs. Also shows anemia, c/w previous. Will start on iron supplementation. Lipase WNL, CMP WNL. Upreg neg. U/A clear. Wet prep revealed trichomonas, which we treated for here. Also showed few clue cells but question whether this is truly BV vs just from the trichomonas. Empirically treated for GC/CT, those labs are pending along with HIV/RPR. Discussed using protection and getting partners tested and treated. Discussed f/up with [facility] regarding her HTN. Pt feeling somewhat better although metronidazole is not helping her symptoms. She's tolerating PO now, eating apple sauce. Will send home with nausea meds and pain meds. I explained the diagnosis and have given explicit precautions to return to the ER including for any other new or worsening symptoms. The patient understands and accepts the medical plan as it's been dictated and I have answered their questions. Discharge instructions concerning home care and prescriptions have been given. The patient is STABLE and is discharged to home in good condition.    I personally performed the services described in this documentation, which was scribed in my presence.  The recorded information has been reviewed and is accurate.   BP 151/97 mmHg  Pulse 73  Temp(Src) 98.9 F (37.2 C) (Oral)  Resp 18  Ht 5\' 8"  (1.727 m)  Wt 263 lb (119.296 kg)  BMI 40.00 kg/m2  SpO2 100%  LMP 10/12/2014  Meds ordered this encounter  Medications  . gi cocktail (Maalox,Lidocaine,Donnatal)    Sig:   . ondansetron (ZOFRAN-ODT) disintegrating tablet 8 mg    Sig:   . azithromycin (ZITHROMAX) tablet 1,000 mg    Sig:    And  . cefTRIAXone (ROCEPHIN) injection 250 mg    Sig:     Order Specific Question:  Antibiotic Indication:    Answer:  STD  . sterile water (preservative free) injection 1.5 mL    Sig:   . metroNIDAZOLE (FLAGYL) tablet 2,000 mg    Sig:   . HYDROcodone-acetaminophen (NORCO) 5-325 MG per  tablet    Sig: Take 1 tablet by mouth every 6 (six) hours as needed for severe pain.    Dispense:  6 tablet    Refill:  0    Order Specific Question:  Supervising Provider    Answer:  MILLER, BRIAN [[id number]]  . ondansetron (ZOFRAN) 8 MG tablet    Sig: Take 1 tablet (8 mg total) by mouth every 8 (eight) hours as needed for nausea or vomiting.    Dispense:  10 tablet    Refill:  0    Order Specific Question:  Supervising Provider    Answer:  Hyacinth Meeker, BRIAN [[id number]]  . ferrous sulfate 325 (65 FE) MG tablet    Sig: Take 1 tablet (325 mg total) by mouth daily.    Dispense:  30 tablet    Refill:  0    Order Specific Question:  Supervising Provider    Answer:  Eber Hong [[id number]]     Laronda Lisby Camprubi-Soms, PA-C 11/23/14 1609  Benjiman Core, MD 11/23/14 [address] PA-C 11/23/14 1628  Benjiman Core, MD 11/25/14 2204

## 2014-11-23 NOTE — ED Notes (Signed)
Pt reports she stopped taking B/P meds because she felt bad. Pt did not follow up with MD.

## 2014-11-23 NOTE — ED Notes (Signed)
Pt here for STD check sts someone she had been with called her and told her they had been infected,

## 2014-11-23 NOTE — Discharge Instructions (Signed)
Use zofran as prescribed, as needed for nausea. Stay well hydrated with small sips of fluids throughout the day. Follow a BRAT (banana-rice-applesauce-toast) diet as described below for the next 24-48 hours. The 'BRAT' diet is suggested, then progress to diet as tolerated as symptoms abate. Call your doctor if bloody stools, persistent diarrhea, vomiting, fever or abdominal pain. Use ibuprofen or norco as needed for pain but don't drive or operate machinery while taking this medication. Start taking iron supplements as you were found to be anemic. Your vaginal swab showed trichomonas, which you were treated for but your partners must be tested and treated. Follow up with [facility] for future STD concerns or screenings. This is the recommendation by the CDC for people with multiple sexual partners or hx of STDs. You have been treated for gonorrhea and chlamydia in the ER but the hospital will call you if lab is positive. You were tested for HIV and Syphilis, and the hospital will call you if the lab is positive. Your blood pressure was high here, you need to follow up with [place] [facility] for ongoing management. Return to ER for changing or worsening of symptoms.  Food Choices to Help Relieve Diarrhea When you have diarrhea, the foods you eat and your eating habits are very important. Choosing the right foods and drinks can help relieve diarrhea. Also, because diarrhea can last up to 7 days, you need to replace lost fluids and electrolytes (such as sodium, potassium, and chloride) in order to help prevent dehydration.  WHAT GENERAL GUIDELINES DO I NEED TO FOLLOW?  Slowly drink 1 cup (8 oz) of fluid for each episode of diarrhea. If you are getting enough fluid, your urine will be clear or pale yellow.  Eat starchy foods. Some good choices include white rice, white toast, pasta, low-fiber cereal, baked potatoes (without the skin), saltine crackers, and  bagels.  Avoid large servings of any cooked vegetables.  Limit fruit to two servings per day. A serving is  cup or 1 small piece.  Choose foods with less than 2 g of fiber per serving.  Limit fats to less than 8 tsp (38 g) per day.  Avoid fried foods.  Eat foods that have probiotics in them. Probiotics can be found in certain dairy products.  Avoid foods and beverages that may increase the speed at which food moves through the stomach and intestines (gastrointestinal tract). Things to avoid include:  High-fiber foods, such as dried fruit, raw fruits and vegetables, nuts, seeds, and whole grain foods.  Spicy foods and high-fat foods.  Foods and beverages sweetened with high-fructose corn syrup, honey, or sugar alcohols such as xylitol, sorbitol, and mannitol. WHAT FOODS ARE RECOMMENDED? Grains White rice. White, [place], or pita breads (fresh or toasted), including plain rolls, buns, or bagels. White pasta. Saltine, soda, or graham crackers. Pretzels. Low-fiber cereal. Cooked cereals made with water (such as cornmeal, farina, or cream cereals). Plain muffins. Matzo. Melba toast. Zwieback.  Vegetables Potatoes (without the skin). Strained tomato and vegetable juices. Most well-cooked and canned vegetables without seeds. Tender lettuce. Fruits Cooked or canned applesauce, apricots, cherries, fruit cocktail, grapefruit, peaches, pears, or plums. Fresh bananas, apples without skin, cherries, grapes, cantaloupe, grapefruit, peaches, oranges, or plums.  Meat and Other Protein Products Baked or boiled chicken. Eggs. Tofu. Fish. Seafood. Smooth peanut butter. Ground or well-cooked tender beef, ham, veal, lamb, pork, or poultry.  Dairy Plain yogurt, kefir, and unsweetened liquid yogurt. Lactose-free milk, buttermilk,  or soy milk. Plain hard cheese. Beverages Sport drinks. Clear broths. Diluted fruit juices (except prune). Regular, caffeine-free sodas such as ginger ale. Water. Decaffeinated  teas. Oral rehydration solutions. Sugar-free beverages not sweetened with sugar alcohols. Other Bouillon, broth, or soups made from recommended foods.  The items listed above may not be a complete list of recommended foods or beverages. Contact your dietitian for more options. WHAT FOODS ARE NOT RECOMMENDED? Grains Whole grain, whole wheat, bran, or rye breads, rolls, pastas, crackers, and cereals. Wild or brown rice. Cereals that contain more than 2 g of fiber per serving. Corn tortillas or taco shells. Cooked or dry oatmeal. Granola. Popcorn. Vegetables Raw vegetables. Cabbage, broccoli, Brussels sprouts, artichokes, baked beans, beet greens, corn, kale, legumes, peas, sweet potatoes, and yams. Potato skins. Cooked spinach and cabbage. Fruits Dried fruit, including raisins and dates. Raw fruits. Stewed or dried prunes. Fresh apples with skin, apricots, mangoes, pears, raspberries, and strawberries.  Meat and Other Protein Products Chunky peanut butter. Nuts and seeds. Beans and lentils. Tomasa Blase.  Dairy High-fat cheeses. Milk, chocolate milk, and beverages made with milk, such as milk shakes. Cream. Ice cream. Sweets and Desserts Sweet rolls, doughnuts, and sweet breads. Pancakes and waffles. Fats and Oils Butter. Cream sauces. Margarine. Salad oils. Plain salad dressings. Olives. Avocados.  Beverages Caffeinated beverages (such as coffee, tea, soda, or energy drinks). Alcoholic beverages. Fruit juices with pulp. Prune juice. Soft drinks sweetened with high-fructose corn syrup or sugar alcohols. Other Coconut. Hot sauce. Chili powder. Mayonnaise. Gravy. Cream-based or milk-based soups.  The items listed above may not be a complete list of foods and beverages to avoid. Contact your dietitian for more information. WHAT SHOULD I DO IF I BECOME DEHYDRATED? Diarrhea can sometimes lead to dehydration. Signs of dehydration include dark urine and dry mouth and skin. If you think you are dehydrated,  you should rehydrate with an oral rehydration solution. These solutions can be purchased at pharmacies, retail stores, or online.  Drink -1 cup (120-240 mL) of oral rehydration solution each time you have an episode of diarrhea. If drinking this amount makes your diarrhea worse, try drinking smaller amounts more often. For example, drink 1-3 tsp (5-15 mL) every 5-10 minutes.  A general rule for staying hydrated is to drink 1-2 L of fluid per day. Talk to your health care provider about the specific amount you should be drinking each day. Drink enough fluids to keep your urine clear or pale yellow. Document Released: 08/05/2003 Document Revised: 05/20/2013 Document Reviewed: 04/07/2013 [facility] Patient Information 2015 [place], [place]. This information is not intended to replace advice given to you by your health care provider. Make sure you discuss any questions you have with your health care provider.    Abdominal Pain Many things can cause belly (abdominal) pain. Most times, the belly pain is not dangerous. Many cases of belly pain can be watched and treated at home. HOME CARE   Do not take medicines that help you go poop (laxatives) unless told to by your doctor.  Only take medicine as told by your doctor.  Eat or drink as told by your doctor. Your doctor will tell you if you should be on a special diet. GET HELP IF:  You do not know what is causing your belly pain.  You have belly pain while you are sick to your stomach (nauseous) or have runny poop (diarrhea).  You have pain while you pee or poop.  Your belly pain wakes you up at  night.  You have belly pain that gets worse or better when you eat.  You have belly pain that gets worse when you eat fatty foods.  You have a fever. GET HELP RIGHT AWAY IF:   The pain does not go away within 2 hours.  You keep throwing up (vomiting).  The pain changes and is only in the right or left part of the belly.  You have bloody or tarry  looking poop. MAKE SURE YOU:   Understand these instructions.  Will watch your condition.  Will get help right away if you are not doing well or get worse. Document Released: 11/01/2007 Document Revised: 05/20/2013 Document Reviewed: 01/22/2013 [facility] Patient Information 2015 [place], [place]. This information is not intended to replace advice given to you by your health care provider. Make sure you discuss any questions you have with your health care provider.  Diarrhea Diarrhea is watery poop (stool). It can make you feel weak, tired, thirsty, or give you a dry mouth (signs of dehydration). Watery poop is a sign of another problem, most often an infection. It often lasts 2-3 days. It can last longer if it is a sign of something serious. Take care of yourself as told by your doctor. HOME CARE   Drink 1 cup (8 ounces) of fluid each time you have watery poop.  Do not drink the following fluids:  Those that contain simple sugars (fructose, glucose, galactose, lactose, sucrose, maltose).  Sports drinks.  Fruit juices.  Whole milk products.  Sodas.  Drinks with caffeine (coffee, tea, soda) or alcohol.  Oral rehydration solution may be used if the doctor says it is okay. You may make your own solution. Follow this recipe:   - teaspoon table salt.   teaspoon baking soda.   teaspoon salt substitute containing potassium chloride.  1 tablespoons sugar.  1 liter (34 ounces) of water.  Avoid the following foods:  High fiber foods, such as raw fruits and vegetables.  Nuts, seeds, and whole grain breads and cereals.   Those that are sweetened with sugar alcohols (xylitol, sorbitol, mannitol).  Try eating the following foods:  Starchy foods, such as rice, toast, pasta, low-sugar cereal, oatmeal, baked potatoes, crackers, and bagels.  Bananas.  Applesauce.  Eat probiotic-rich foods, such as yogurt and milk products that are fermented.  Wash your hands well after each  time you have watery poop.  Only take medicine as told by your doctor.  Take a warm bath to help lessen burning or pain from having watery poop. GET HELP RIGHT AWAY IF:   You cannot drink fluids without throwing up (vomiting).  You keep throwing up.  You have blood in your poop, or your poop looks black and tarry.  You do not pee (urinate) in 6-8 hours, or there is only a small amount of very dark pee.  You have belly (abdominal) pain that gets worse or stays in the same spot (localizes).  You are weak, dizzy, confused, or light-headed.  You have a very bad headache.  Your watery poop gets worse or does not get better.  You have a fever or lasting symptoms for more than 2-3 days.  You have a fever and your symptoms suddenly get worse. MAKE SURE YOU:   Understand these instructions.  Will watch your condition.  Will get help right away if you are not doing well or get worse. Document Released: 11/01/2007 Document Revised: 09/29/2013 Document Reviewed: 01/21/2012 [facility] Patient Information 2015 [place], [place]. This information  is not intended to replace advice given to you by your health care provider. Make sure you discuss any questions you have with your health care provider.  Nausea, Adult Nausea means you feel sick to your stomach or need to throw up (vomit). It may be a sign of a more serious problem. If nausea gets worse, you may throw up. If you throw up a lot, you may lose too much body fluid (dehydration). HOME CARE   Get plenty of rest.  Ask your doctor how to replace body fluid losses (rehydrate).  Eat small amounts of food. Sip liquids more often.  Take all medicines as told by your doctor. GET HELP RIGHT AWAY IF:  You have a fever.  You pass out (faint).  You keep throwing up or have blood in your throw up.  You are very weak, have dry lips or a dry mouth, or you are very thirsty (dehydrated).  You have dark or bloody poop (stool).  You have  very bad chest or belly (abdominal) pain.  You do not get better after 2 days, or you get worse.  You have a headache. MAKE SURE YOU:  Understand these instructions.  Will watch your condition.  Will get help right away if you are not doing well or get worse. Document Released: 05/04/2011 Document Revised: 08/07/2011 Document Reviewed: 05/04/2011 [facility] Patient Information 2015 [place], [place]. This information is not intended to replace advice given to you by your health care provider. Make sure you discuss any questions you have with your health care provider.  Trichomoniasis Trichomoniasis is an infection caused by an organism called Trichomonas. The infection can affect both women and men. In women, the outer female genitalia and the vagina are affected. In men, the penis is mainly affected, but the prostate and other reproductive organs can also be involved. Trichomoniasis is a sexually transmitted infection (STI) and is most often passed to another person through sexual contact.  RISK FACTORS  Having unprotected sexual intercourse.  Having sexual intercourse with an infected partner. SIGNS AND SYMPTOMS  Symptoms of trichomoniasis in women include:  Abnormal gray-green frothy vaginal discharge.  Itching and irritation of the vagina.  Itching and irritation of the area outside the vagina. Symptoms of trichomoniasis in men include:   Penile discharge with or without pain.  Pain during urination. This results from inflammation of the urethra. DIAGNOSIS  Trichomoniasis may be found during a Pap test or physical exam. Your health care provider may use one of the following methods to help diagnose this infection:  Examining vaginal discharge under a microscope. For men, urethral discharge would be examined.  Testing the pH of the vagina with a test tape.  Using a vaginal swab test that checks for the Trichomonas organism. A test is available that provides results within a  few minutes.  Doing a culture test for the organism. This is not usually needed. TREATMENT   You may be given medicine to fight the infection. Women should inform their health care provider if they could be or are pregnant. Some medicines used to treat the infection should not be taken during pregnancy.  Your health care provider may recommend over-the-counter medicines or creams to decrease itching or irritation.  Your sexual partner will need to be treated if infected. HOME CARE INSTRUCTIONS   Take medicines only as directed by your health care provider.  Take over-the-counter medicine for itching or irritation as directed by your health care provider.  Do not have sexual  intercourse while you have the infection.  Women should not douche or wear tampons while they have the infection.  Discuss your infection with your partner. Your partner may have gotten the infection from you, or you may have gotten it from your partner.  Have your sex partner get examined and treated if necessary.  Practice safe, informed, and protected sex.  See your health care provider for other STI testing. SEEK MEDICAL CARE IF:   You still have symptoms after you finish your medicine.  You develop abdominal pain.  You have pain when you urinate.  You have bleeding after sexual intercourse.  You develop a rash.  Your medicine makes you sick or makes you throw up (vomit). MAKE SURE YOU:  Understand these instructions.  Will watch your condition.  Will get help right away if you are not doing well or get worse. Document Released: 11/08/2000 Document Revised: 09/29/2013 Document Reviewed: 02/24/2013 [facility] Patient Information 2015 [place]. This information is not intended to replace advice given to you by your health care provider. Make sure you discuss any questions you have with your health care provider.  Sexually Transmitted Disease A sexually transmitted disease (STD) is a  disease or infection often passed to another person during sex. However, STDs can be passed through nonsexual ways. An STD can be passed through:  Spit (saliva).  Semen.  Blood.  Mucus from the vagina.  Pee (urine). HOW CAN I LESSEN MY CHANCES OF GETTING AN STD?  Use:  Latex condoms.  Water-soluble lubricants with condoms. Do not use petroleum jelly or oils.  Dental dams. These are small pieces of latex that are used as a barrier during oral sex.  Avoid having more than one sex partner.  Do not have sex with someone who has other sex partners.  Do not have sex with anyone you do not know or who is at high risk for an STD.  Avoid risky sex that can break your skin.  Do not have sex if you have open sores on your mouth or skin.  Avoid drinking too much alcohol or taking illegal drugs. Alcohol and drugs can affect your good judgment.  Avoid oral and anal sex acts.  Get shots (vaccines) for HPV and hepatitis.  If you are at risk of being infected with HIV, it is advised that you take a certain medicine daily to prevent HIV infection. This is called pre-exposure prophylaxis (PrEP). You may be at risk if:  You are a man who has sex with other men (MSM).  You are attracted to the opposite sex (heterosexual) and are having sex with more than one partner.  You take drugs with a needle.  You have sex with someone who has HIV.  Talk with your doctor about if you are at high risk of being infected with HIV. If you begin to take PrEP, get tested for HIV first. Get tested every 3 months for as long as you are taking PrEP. WHAT SHOULD I DO IF I THINK I HAVE AN STD?  See your doctor.  Tell your sex partner(s) that you have an STD. They should be tested and treated.  Do not have sex until your doctor says it is okay. WHEN SHOULD I GET HELP? Get help right away if:  You have bad belly (abdominal) pain.  You are a man and have puffiness (swelling) or pain in your  testicles.  You are a woman and have puffiness in your vagina. Document Released: 06/22/2004  Document Revised: 05/20/2013 Document Reviewed: 11/08/2012 [facility] Patient Information 2015 [place], [place]. This information is not intended to replace advice given to you by your health care provider. Make sure you discuss any questions you have with your health care provider.  Hypertension Hypertension is another name for high blood pressure. High blood pressure forces your heart to work harder to pump blood. A blood pressure reading has two numbers, which includes a higher number over a lower number (example: 110/72). HOME CARE   Have your blood pressure rechecked by your doctor.  Only take medicine as told by your doctor. Follow the directions carefully. The medicine does not work as well if you skip doses. Skipping doses also puts you at risk for problems.  Do not smoke.  Monitor your blood pressure at home as told by your doctor. GET HELP IF:  You think you are having a reaction to the medicine you are taking.  You have repeat headaches or feel dizzy.  You have puffiness (swelling) in your ankles.  You have trouble with your vision. GET HELP RIGHT AWAY IF:   You get a very bad headache and are confused.  You feel weak, numb, or faint.  You get chest or belly (abdominal) pain.  You throw up (vomit).  You cannot breathe very well. MAKE SURE YOU:   Understand these instructions.  Will watch your condition.  Will get help right away if you are not doing well or get worse. Document Released: 11/01/2007 Document Revised: 05/20/2013 Document Reviewed: 03/07/2013 [facility] Patient Information 2015 [place], [place]. This information is not intended to replace advice given to you by your health care provider. Make sure you discuss any questions you have with your health care provider.

## 2014-11-24 LAB — HIV ANTIBODY (ROUTINE TESTING W REFLEX): HIV Screen 4th Generation wRfx: NONREACTIVE

## 2014-11-24 LAB — GC/CHLAMYDIA PROBE AMP (~~LOC~~) NOT AT ARMC
Chlamydia: NEGATIVE
Neisseria Gonorrhea: NEGATIVE

## 2014-11-24 LAB — RPR: RPR Ser Ql: NONREACTIVE

## 2014-12-22 DIAGNOSIS — Y9389 Activity, other specified: Secondary | ICD-10-CM | POA: Insufficient documentation

## 2014-12-22 DIAGNOSIS — Y9289 Other specified places as the place of occurrence of the external cause: Secondary | ICD-10-CM | POA: Insufficient documentation

## 2014-12-22 DIAGNOSIS — R55 Syncope and collapse: Secondary | ICD-10-CM | POA: Diagnosis not present

## 2014-12-22 DIAGNOSIS — S3992XA Unspecified injury of lower back, initial encounter: Secondary | ICD-10-CM | POA: Diagnosis not present

## 2014-12-22 DIAGNOSIS — S0990XA Unspecified injury of head, initial encounter: Secondary | ICD-10-CM | POA: Insufficient documentation

## 2014-12-22 DIAGNOSIS — S0181XA Laceration without foreign body of other part of head, initial encounter: Secondary | ICD-10-CM | POA: Diagnosis present

## 2014-12-22 DIAGNOSIS — Y990 Civilian activity done for income or pay: Secondary | ICD-10-CM | POA: Diagnosis not present

## 2014-12-22 DIAGNOSIS — Z79899 Other long term (current) drug therapy: Secondary | ICD-10-CM | POA: Diagnosis not present

## 2014-12-23 ENCOUNTER — Emergency Department (HOSPITAL_COMMUNITY): Payer: No Typology Code available for payment source

## 2014-12-23 ENCOUNTER — Encounter (HOSPITAL_COMMUNITY): Payer: Self-pay | Admitting: Emergency Medicine

## 2014-12-23 ENCOUNTER — Emergency Department (HOSPITAL_COMMUNITY)
Admission: EM | Admit: 2014-12-23 | Discharge: 2014-12-23 | Disposition: A | Discharge status: Home / Self Care | Payer: No Typology Code available for payment source | Attending: Emergency Medicine | Admitting: Emergency Medicine

## 2014-12-23 DIAGNOSIS — S0181XA Laceration without foreign body of other part of head, initial encounter: Secondary | ICD-10-CM

## 2014-12-23 DIAGNOSIS — S0990XA Unspecified injury of head, initial encounter: Secondary | ICD-10-CM

## 2014-12-23 DIAGNOSIS — M545 Low back pain: Secondary | ICD-10-CM

## 2014-12-23 MED ORDER — LIDOCAINE-EPINEPHRINE (PF) 2 %-1:200000 IJ SOLN
10.0000 mL | Freq: Once | INTRAMUSCULAR | Status: AC
  Administered 2014-12-23: 10 mL via INTRADERMAL
  Filled 2014-12-23: qty 20

## 2014-12-23 MED ORDER — OXYCODONE-ACETAMINOPHEN 5-325 MG PO TABS
2.0000 | ORAL_TABLET | Freq: Once | ORAL | Status: AC
  Administered 2014-12-23: 2 via ORAL
  Filled 2014-12-23: qty 2

## 2014-12-23 MED ORDER — OXYCODONE-ACETAMINOPHEN 5-325 MG PO TABS: 1.0000 | ORAL_TABLET | ORAL | Status: DC | PRN

## 2014-12-23 MED ORDER — ONDANSETRON 4 MG PO TBDP
4.0000 mg | ORAL_TABLET | Freq: Once | ORAL | Status: AC
  Administered 2014-12-23: 4 mg via ORAL
  Filled 2014-12-23: qty 1

## 2014-12-23 MED ORDER — IBUPROFEN 800 MG PO TABS: 800.0000 mg | ORAL_TABLET | Freq: Three times a day (TID) | ORAL | Status: DC | PRN

## 2014-12-23 NOTE — ED Notes (Signed)
[employer] officer speaking with pt. at this time .  
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
## 2014-12-23 NOTE — ED Notes (Signed)
Dr. Elesa Massed suturing wound at bedside.

## 2014-12-23 NOTE — Discharge Instructions (Signed)
Back Pain, Adult Low back pain is very common. About 1 in 5 people have back pain.The cause of low back pain is rarely dangerous. The pain often gets better over time.About half of people with a sudden onset of back pain feel better in just 2 weeks. About 8 in 10 people feel better by 6 weeks.  CAUSES Some common causes of back pain include:  Strain of the muscles or ligaments supporting the spine.  Wear and tear (degeneration) of the spinal discs.  Arthritis.  Direct injury to the back. DIAGNOSIS Most of the time, the direct cause of low back pain is not known.However, back pain can be treated effectively even when the exact cause of the pain is unknown.Answering your caregiver's questions about your overall health and symptoms is one of the most accurate ways to make sure the cause of your pain is not dangerous. If your caregiver needs more information, he or she may order lab work or imaging tests (X-rays or MRIs).However, even if imaging tests show changes in your back, this usually does not require surgery. HOME CARE INSTRUCTIONS For many people, back pain returns.Since low back pain is rarely dangerous, it is often a condition that people can learn to [facility] their own.   Remain active. It is stressful on the back to sit or stand in one place. Do not sit, drive, or stand in one place for more than 30 minutes at a time. Take short walks on level surfaces as soon as pain allows.Try to increase the length of time you walk each day.  Do not stay in bed.Resting more than 1 or 2 days can delay your recovery.  Do not avoid exercise or work.Your body is made to move.It is not dangerous to be active, even though your back may hurt.Your back will likely heal faster if you return to being active before your pain is gone.  Pay attention to your body when you bend and lift. Many people have less discomfortwhen lifting if they bend their knees, keep the load close to their bodies,and  avoid twisting. Often, the most comfortable positions are those that put less stress on your recovering back.  Find a comfortable position to sleep. Use a firm mattress and lie on your side with your knees slightly bent. If you lie on your back, put a pillow under your knees.  Only take over-the-counter or prescription medicines as directed by your caregiver. Over-the-counter medicines to reduce pain and inflammation are often the most helpful.Your caregiver may prescribe muscle relaxant drugs.These medicines help dull your pain so you can more quickly return to your normal activities and healthy exercise.  Put ice on the injured area.  Put ice in a plastic bag.  Place a towel between your skin and the bag.  Leave the ice on for 15-20 minutes, 03-04 times a day for the first 2 to 3 days. After that, ice and heat may be alternated to reduce pain and spasms.  Ask your caregiver about trying back exercises and gentle massage. This may be of some benefit.  Avoid feeling anxious or stressed.Stress increases muscle tension and can worsen back pain.It is important to recognize when you are anxious or stressed and learn ways to manage it.Exercise is a great option. SEEK MEDICAL CARE IF:  You have pain that is not relieved with rest or medicine.  You have pain that does not improve in 1 week.  You have new symptoms.  You are generally not feeling well. SEEK  IMMEDIATE MEDICAL CARE IF:   You have pain that radiates from your back into your legs.  You develop new bowel or bladder control problems.  You have unusual weakness or numbness in your arms or legs.  You develop nausea or vomiting.  You develop abdominal pain.  You feel faint. Document Released: 05/15/2005 Document Revised: 11/14/2011 Document Reviewed: 09/16/2013 [facility] Patient Information 2015 [place], [place]. This information is not intended to replace advice given to you by your health care provider. Make sure you  discuss any questions you have with your health care provider.  Head Injury You have received a head injury. It does not appear serious at this time. Headaches and vomiting are common following head injury. It should be easy to awaken from sleeping. Sometimes it is necessary for you to stay in the emergency department for a while for observation. Sometimes admission to the hospital may be needed. After injuries such as yours, most problems occur within the first 24 hours, but side effects may occur up to 7-10 days after the injury. It is important for you to carefully monitor your condition and contact your health care provider or seek immediate medical care if there is a change in your condition. WHAT ARE THE TYPES OF HEAD INJURIES? Head injuries can be as minor as a bump. Some head injuries can be more severe. More severe head injuries include:  A jarring injury to the brain (concussion).  A bruise of the brain (contusion). This mean there is bleeding in the brain that can cause swelling.  A cracked skull (skull fracture).  Bleeding in the brain that collects, clots, and forms a bump (hematoma). WHAT CAUSES A HEAD INJURY? A serious head injury is most likely to happen to someone who is in a car wreck and is not wearing a seat belt. Other causes of major head injuries include bicycle or motorcycle accidents, sports injuries, and falls. HOW ARE HEAD INJURIES DIAGNOSED? A complete history of the event leading to the injury and your current symptoms will be helpful in diagnosing head injuries. Many times, pictures of the brain, such as CT or MRI are needed to see the extent of the injury. Often, an overnight hospital stay is necessary for observation.  WHEN SHOULD I SEEK IMMEDIATE MEDICAL CARE?  You should get help right away if:  You have confusion or drowsiness.  You feel sick to your stomach (nauseous) or have continued, forceful vomiting.  You have dizziness or unsteadiness that is getting  worse.  You have severe, continued headaches not relieved by medicine. Only take over-the-counter or prescription medicines for pain, fever, or discomfort as directed by your health care provider.  You do not have normal function of the arms or legs or are unable to walk.  You notice changes in the black spots in the center of the colored part of your eye (pupil).  You have a clear or bloody fluid coming from your nose or ears.  You have a loss of vision. During the next 24 hours after the injury, you must stay with someone who can watch you for the warning signs. This person should contact local emergency services (911 in the [place] if you have seizures, you become unconscious, or you are unable to wake up. HOW CAN I PREVENT A HEAD INJURY IN THE FUTURE? The most important factor for preventing major head injuries is avoiding motor vehicle accidents. To minimize the potential for damage to your head, it is crucial to wear seat belts  while riding in motor vehicles. Wearing helmets while bike riding and playing collision sports (like football) is also helpful. Also, avoiding dangerous activities around the house will further help reduce your risk of head injury.  WHEN CAN I RETURN TO NORMAL ACTIVITIES AND ATHLETICS? You should be reevaluated by your health care provider before returning to these activities. If you have any of the following symptoms, you should not return to activities or contact sports until 1 week after the symptoms have stopped:  Persistent headache.  Dizziness or vertigo.  Poor attention and concentration.  Confusion.  Memory problems.  Nausea or vomiting.  Fatigue or tire easily.  Irritability.  Intolerant of bright lights or loud noises.  Anxiety or depression.  Disturbed sleep. MAKE SURE YOU:   Understand these instructions.  Will watch your condition.  Will get help right away if you are not doing well or get worse. Document Released: 05/15/2005  Document Revised: 05/20/2013 Document Reviewed: 01/20/2013 [facility] Patient Information 2015 [place], [place]. This information is not intended to replace advice given to you by your health care provider. Make sure you discuss any questions you have with your health care provider.  Laceration Care, Adult A laceration is a cut or lesion that goes through all layers of the skin and into the tissue just beneath the skin. TREATMENT  Some lacerations may not require closure. Some lacerations may not be able to be closed due to an increased risk of infection. It is important to see your caregiver as soon as possible after an injury to minimize the risk of infection and maximize the opportunity for successful closure. If closure is appropriate, pain medicines may be given, if needed. The wound will be cleaned to help prevent infection. Your caregiver will use stitches (sutures), staples, wound glue (adhesive), or skin adhesive strips to repair the laceration. These tools bring the skin edges together to allow for faster healing and a better cosmetic outcome. However, all wounds will heal with a scar. Once the wound has healed, scarring can be minimized by covering the wound with sunscreen during the day for 1 full year. HOME CARE INSTRUCTIONS  For sutures or staples:  Keep the wound clean and dry.  If you were given a bandage (dressing), you should change it at least once a day. Also, change the dressing if it becomes wet or dirty, or as directed by your caregiver.  Wash the wound with soap and water 2 times a day. Rinse the wound off with water to remove all soap. Pat the wound dry with a clean towel.  After cleaning, apply a thin layer of the antibiotic ointment as recommended by your caregiver. This will help prevent infection and keep the dressing from sticking.  You may shower as usual after the first 24 hours. Do not soak the wound in water until the sutures are removed.  Only take  over-the-counter or prescription medicines for pain, discomfort, or fever as directed by your caregiver.  Get your sutures or staples removed as directed by your caregiver. For skin adhesive strips:  Keep the wound clean and dry.  Do not get the skin adhesive strips wet. You may bathe carefully, using caution to keep the wound dry.  If the wound gets wet, pat it dry with a clean towel.  Skin adhesive strips will fall off on their own. You may trim the strips as the wound heals. Do not remove skin adhesive strips that are still stuck to the wound. They will fall  off in time. For wound adhesive:  You may briefly wet your wound in the shower or bath. Do not soak or scrub the wound. Do not swim. Avoid periods of heavy perspiration until the skin adhesive has fallen off on its own. After showering or bathing, gently pat the wound dry with a clean towel.  Do not apply liquid medicine, cream medicine, or ointment medicine to your wound while the skin adhesive is in place. This may loosen the film before your wound is healed.  If a dressing is placed over the wound, be careful not to apply tape directly over the skin adhesive. This may cause the adhesive to be pulled off before the wound is healed.  Avoid prolonged exposure to sunlight or tanning lamps while the skin adhesive is in place. Exposure to ultraviolet light in the first year will darken the scar.  The skin adhesive will usually remain in place for 5 to 10 days, then naturally fall off the skin. Do not pick at the adhesive film. You may need a tetanus shot if:  You cannot remember when you had your last tetanus shot.  You have never had a tetanus shot. If you get a tetanus shot, your arm may swell, get red, and feel warm to the touch. This is common and not a problem. If you need a tetanus shot and you choose not to have one, there is a rare chance of getting tetanus. Sickness from tetanus can be serious. SEEK MEDICAL CARE IF:   You  have redness, swelling, or increasing pain in the wound.  You see a red line that goes away from the wound.  You have yellowish-white fluid (pus) coming from the wound.  You have a fever.  You notice a bad smell coming from the wound or dressing.  Your wound breaks open before or after sutures have been removed.  You notice something coming out of the wound such as wood or glass.  Your wound is on your hand or foot and you cannot move a finger or toe. SEEK IMMEDIATE MEDICAL CARE IF:   Your pain is not controlled with prescribed medicine.  You have severe swelling around the wound causing pain and numbness or a change in color in your arm, hand, leg, or foot.  Your wound splits open and starts bleeding.  You have worsening numbness, weakness, or loss of function of any joint around or beyond the wound.  You develop painful lumps near the wound or on the skin anywhere on your body. MAKE SURE YOU:   Understand these instructions.  Will watch your condition.  Will get help right away if you are not doing well or get worse. Document Released: 05/15/2005 Document Revised: 08/07/2011 Document Reviewed: 11/08/2010 [facility] Patient Information 2015 [place], [place]. This information is not intended to replace advice given to you by your health care provider. Make sure you discuss any questions you have with your health care provider.

## 2014-12-23 NOTE — ED Notes (Signed)
Patient not in room - has been transported to xray. Will administer PO medications when patient returns.
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
## 2014-12-23 NOTE — ED Notes (Signed)
EDP updated on pt.'s presenting injuries , no order given at this time .

## 2014-12-30 ENCOUNTER — Encounter (HOSPITAL_COMMUNITY): Payer: Self-pay | Admitting: Emergency Medicine

## 2014-12-30 ENCOUNTER — Emergency Department (INDEPENDENT_AMBULATORY_CARE_PROVIDER_SITE_OTHER)
Admission: EM | Admit: 2014-12-30 | Discharge: 2014-12-30 | Disposition: A | Discharge status: Home / Self Care | Payer: Self-pay | Source: Home / Self Care | Attending: Family Medicine | Admitting: Family Medicine

## 2014-12-30 DIAGNOSIS — Z4802 Encounter for removal of sutures: Secondary | ICD-10-CM

## 2014-12-30 NOTE — ED Notes (Signed)
Here for suture removal of stitches on forehead Went to ER on 12/23/14 States forehead is still painful 7 stitches located

## 2014-12-30 NOTE — ED Provider Notes (Signed)
CSN: [id number]     Arrival date & time 12/30/14  1300 History   First MD Initiated Contact with Patient 12/30/14 1310     Chief Complaint  Patient presents with  . Suture / Staple Removal   (Consider location/radiation/quality/duration/timing/severity/associated sxs/prior Treatment) Patient is a 29 y.o. female presenting with suture removal. The history is provided by the patient.  Suture / Staple Removal This is a new problem. The current episode started more than 1 week ago (no problems from forehead lac.). The problem has not changed since onset.   History reviewed. No pertinent past medical history. History reviewed. No pertinent past surgical history. History reviewed. No pertinent family history. History  Substance Use Topics  . Smoking status: Never Smoker   . Smokeless tobacco: Not on file  . Alcohol Use: No   OB History    No data available     Review of Systems  Constitutional: Negative.   Skin: Positive for wound.    Allergies  Review of patient's allergies indicates no known allergies.  Home Medications   Prior to Admission medications   Medication Sig Start Date End Date Taking? Authorizing Provider  ferrous sulfate 325 (65 FE) MG tablet Take 1 tablet (325 mg total) by mouth daily. 11/23/14   Mercedes Camprubi-Soms, PA-C  ibuprofen (ADVIL,MOTRIN) 800 MG tablet Take 1 tablet (800 mg total) by mouth every 8 (eight) hours as needed for mild pain. 12/23/14   Kristen N Ward, DO  oxyCODONE-acetaminophen (PERCOCET/ROXICET) 5-325 MG per tablet Take 1 tablet by mouth every 4 (four) hours as needed. 12/23/14   Kristen N Ward, DO   BP 141/87 mmHg  Pulse 108  Temp(Src) 98.3 F (36.8 C) (Oral)  Resp 16  SpO2 99%  LMP 12/23/2014 Physical Exam  Constitutional: She is oriented to person, place, and time. She appears well-developed and well-nourished. No distress.  Neurological: She is alert and oriented to person, place, and time.  Skin: Skin is warm and dry.  Midline  forehead lac, 7 stitches removed. Well healed.  Nursing note and vitals reviewed.   ED Course  Procedures (including critical care time) Labs Review Labs Reviewed - No data to display  Imaging Review No results found.   MDM   1. Visit for suture removal        Linna Hoff, MD 01/04/15 2047

## 2015-06-07 ENCOUNTER — Encounter (HOSPITAL_COMMUNITY): Payer: Self-pay | Admitting: *Deleted

## 2015-06-07 ENCOUNTER — Emergency Department (HOSPITAL_COMMUNITY)
Admission: EM | Admit: 2015-06-07 | Discharge: 2015-06-07 | Disposition: A | Discharge status: Home / Self Care | Payer: Self-pay | Attending: Emergency Medicine | Admitting: Emergency Medicine

## 2015-06-07 DIAGNOSIS — R6883 Chills (without fever): Secondary | ICD-10-CM | POA: Insufficient documentation

## 2015-06-07 DIAGNOSIS — R197 Diarrhea, unspecified: Secondary | ICD-10-CM | POA: Insufficient documentation

## 2015-06-07 DIAGNOSIS — R109 Unspecified abdominal pain: Secondary | ICD-10-CM

## 2015-06-07 DIAGNOSIS — Z3202 Encounter for pregnancy test, result negative: Secondary | ICD-10-CM | POA: Insufficient documentation

## 2015-06-07 DIAGNOSIS — Z79899 Other long term (current) drug therapy: Secondary | ICD-10-CM | POA: Insufficient documentation

## 2015-06-07 DIAGNOSIS — R110 Nausea: Secondary | ICD-10-CM | POA: Insufficient documentation

## 2015-06-07 DIAGNOSIS — R1084 Generalized abdominal pain: Secondary | ICD-10-CM | POA: Insufficient documentation

## 2015-06-07 LAB — URINALYSIS, ROUTINE W REFLEX MICROSCOPIC
Bilirubin Urine: NEGATIVE
Glucose, UA: NEGATIVE mg/dL
Hgb urine dipstick: NEGATIVE
Ketones, ur: NEGATIVE mg/dL
NITRITE: NEGATIVE
PH: 8 (ref 5.0–8.0)
Protein, ur: NEGATIVE mg/dL
SPECIFIC GRAVITY, URINE: 1.019 (ref 1.005–1.030)

## 2015-06-07 LAB — CBC
HEMATOCRIT: 28.8 % — AB (ref 36.0–46.0)
HEMOGLOBIN: 8.4 g/dL — AB (ref 12.0–15.0)
MCH: 21.5 pg — ABNORMAL LOW (ref 26.0–34.0)
MCHC: 29.2 g/dL — ABNORMAL LOW (ref 30.0–36.0)
MCV: 73.8 fL — ABNORMAL LOW (ref 78.0–100.0)
Platelets: 360 10*3/uL (ref 150–400)
RBC: 3.9 MIL/uL (ref 3.87–5.11)
RDW: 18.4 % — ABNORMAL HIGH (ref 11.5–15.5)
WBC: 12.6 10*3/uL — AB (ref 4.0–10.5)

## 2015-06-07 LAB — URINE MICROSCOPIC-ADD ON

## 2015-06-07 LAB — COMPREHENSIVE METABOLIC PANEL
ALBUMIN: 3.6 g/dL (ref 3.5–5.0)
ALT: 39 U/L (ref 14–54)
ANION GAP: 9 (ref 5–15)
AST: 32 U/L (ref 15–41)
Alkaline Phosphatase: 72 U/L (ref 38–126)
BUN: <5 mg/dL — ABNORMAL LOW (ref 6–20)
CO2: 26 mmol/L (ref 22–32)
Calcium: 9.3 mg/dL (ref 8.9–10.3)
Chloride: 104 mmol/L (ref 101–111)
Creatinine, Ser: 0.83 mg/dL (ref 0.44–1.00)
GFR calc Af Amer: >60 mL/min (ref >60)
GLUCOSE: 156 mg/dL — AB (ref 65–99)
POTASSIUM: 3.4 mmol/L — AB (ref 3.5–5.1)
Sodium: 139 mmol/L (ref 135–145)
TOTAL PROTEIN: 7.9 g/dL (ref 6.5–8.1)
Total Bilirubin: 0.5 mg/dL (ref 0.3–1.2)

## 2015-06-07 LAB — I-STAT BETA HCG BLOOD, ED (MC, WL, AP ONLY): I-stat hCG, quantitative: <5 m[IU]/mL (ref <5)

## 2015-06-07 LAB — LIPASE, BLOOD: LIPASE: 37 U/L (ref 11–51)

## 2015-06-07 LAB — POC OCCULT BLOOD, ED: Fecal Occult Bld: NEGATIVE

## 2015-06-07 MED ORDER — OMEPRAZOLE 20 MG PO CPDR: 20.0000 mg | DELAYED_RELEASE_CAPSULE | Freq: Every day | ORAL | Status: DC

## 2015-06-07 MED ORDER — MORPHINE SULFATE (PF) 4 MG/ML IV SOLN
4.0000 mg | Freq: Once | INTRAVENOUS | Status: AC
  Administered 2015-06-07: 4 mg via INTRAVENOUS
  Filled 2015-06-07: qty 1

## 2015-06-07 MED ORDER — POTASSIUM CHLORIDE CRYS ER 20 MEQ PO TBCR
40.0000 meq | EXTENDED_RELEASE_TABLET | Freq: Once | ORAL | Status: AC
  Administered 2015-06-07: 40 meq via ORAL
  Filled 2015-06-07: qty 2

## 2015-06-07 MED ORDER — FERROUS SULFATE 325 (65 FE) MG PO TABS: 325.0000 mg | ORAL_TABLET | Freq: Every day | ORAL | Status: DC

## 2015-06-07 MED ORDER — SODIUM CHLORIDE 0.9 % IV BOLUS (SEPSIS)
1000.0000 mL | Freq: Once | INTRAVENOUS | Status: AC
  Administered 2015-06-07: 1000 mL via INTRAVENOUS

## 2015-06-07 MED ORDER — GI COCKTAIL ~~LOC~~
30.0000 mL | Freq: Once | ORAL | Status: AC
  Administered 2015-06-07: 30 mL via ORAL
  Filled 2015-06-07: qty 30

## 2015-06-07 MED ORDER — SUCRALFATE 1 G PO TABS: 1.0000 g | ORAL_TABLET | Freq: Three times a day (TID) | ORAL | Status: DC

## 2015-06-07 NOTE — ED Provider Notes (Signed)
CSN: [id number]     Arrival date & time 06/07/15  1233 History   First MD Initiated Contact with Patient 06/07/15 1655     Chief Complaint  Patient presents with  . Abdominal Pain    HPI   Dana Allison is a 30 y.o. female with no pertinent PMH who presents to the ED with generalized abdominal pain x 1 week. She reports her pain is intermittent. She states eating exacerbates her pain. She has not tried anything for symptom relief. She reports associated chills, nausea, and diarrhea. She denies fever, vomiting, hematochezia, melena, dysuria, urgency, frequency.    History reviewed. No pertinent past medical history. History reviewed. No pertinent past surgical history. History reviewed. No pertinent family history. Social History  Substance Use Topics  . Smoking status: Never Smoker   . Smokeless tobacco: None  . Alcohol Use: No   OB History    No data available      Review of Systems  Constitutional: Positive for chills. Negative for fever.  Eyes: Negative for visual disturbance.  Cardiovascular: Negative for chest pain.  Gastrointestinal: Positive for nausea, abdominal pain and diarrhea. Negative for vomiting, constipation and blood in stool.  Genitourinary: Negative for dysuria, urgency and frequency.  Neurological: Negative for dizziness, light-headedness and headaches.  All other systems reviewed and are negative.     Allergies  Review of patient's allergies indicates no known allergies.  Home Medications   Prior to Admission medications   Medication Sig Start Date End Date Taking? Authorizing Provider  ferrous sulfate 325 (65 FE) MG tablet Take 1 tablet (325 mg total) by mouth daily. 11/23/14   Mercedes Camprubi-Soms, PA-C  ibuprofen (ADVIL,MOTRIN) 800 MG tablet Take 1 tablet (800 mg total) by mouth every 8 (eight) hours as needed for mild pain. 12/23/14   Kristen N Ward, DO  oxyCODONE-acetaminophen (PERCOCET/ROXICET) 5-325 MG per tablet Take 1 tablet by mouth every 4  (four) hours as needed. 12/23/14   Kristen N Ward, DO    BP 184/107 mmHg  Pulse 95  Temp(Src) 98.2 F (36.8 C) (Oral)  Resp 18  SpO2 100%  LMP 05/07/2015 Physical Exam  Constitutional: She is oriented to person, place, and time. She appears well-developed and well-nourished. No distress.  HENT:  Head: Normocephalic and atraumatic.  Right Ear: External ear normal.  Left Ear: External ear normal.  Nose: Nose normal.  Mouth/Throat: Uvula is midline, oropharynx is clear and moist and mucous membranes are normal.  Eyes: Conjunctivae, EOM and lids are normal. Pupils are equal, round, and reactive to light. Right eye exhibits no discharge. Left eye exhibits no discharge. No scleral icterus.  Neck: Normal range of motion. Neck supple.  Cardiovascular: Normal rate, regular rhythm, normal heart sounds, intact distal pulses and normal pulses.   Pulmonary/Chest: Effort normal and breath sounds normal. No respiratory distress. She has no wheezes. She has no rales.  Abdominal: Soft. Normal appearance and bowel sounds are normal. She exhibits no distension and no mass. There is tenderness. There is no rigidity, no rebound and no guarding.  Diffuse TT of abdomen, worse in epigastrium. No rebound, guarding, or masses.  Musculoskeletal: Normal range of motion. She exhibits no edema or tenderness.  Neurological: She is alert and oriented to person, place, and time. She has normal strength. No sensory deficit.  Skin: Skin is warm, dry and intact. No rash noted. She is not diaphoretic. No erythema. No pallor.  Psychiatric: She has a normal mood and affect. Her speech is normal  and behavior is normal.  Nursing note and vitals reviewed.   ED Course  Procedures (including critical care time)  Labs Review Labs Reviewed  COMPREHENSIVE METABOLIC PANEL - Abnormal; Notable for the following:    Potassium 3.4 (*)    Glucose, Bld 156 (*)    BUN <5 (*)    All other components within normal limits  CBC -  Abnormal; Notable for the following:    WBC 12.6 (*)    Hemoglobin 8.4 (*)    HCT 28.8 (*)    MCV 73.8 (*)    MCH 21.5 (*)    MCHC 29.2 (*)    RDW 18.4 (*)    All other components within normal limits  URINALYSIS, ROUTINE W REFLEX MICROSCOPIC (NOT AT [facility]) - Abnormal; Notable for the following:    APPearance CLOUDY (*)    Leukocytes, UA SMALL (*)    All other components within normal limits  URINE MICROSCOPIC-ADD ON - Abnormal; Notable for the following:    Squamous Epithelial / LPF 0-5 (*)    Bacteria, UA RARE (*)    All other components within normal limits  LIPASE, BLOOD  I-STAT BETA HCG BLOOD, ED (MC, WL, AP ONLY)  POC OCCULT BLOOD, ED    Imaging Review No results found.   I have personally reviewed and evaluated these lab results as part of my medical decision-making.   EKG Interpretation None      MDM   Final diagnoses:  Abdominal pain, unspecified abdominal location  Diarrhea, unspecified type  Nausea    30 year old female presents with abdominal pain x 1 week. Reports associated chills, nausea, and diarrhea. Denies fever, vomiting, hematochezia, melena, dysuria, urgency, frequency.  Patient is afebrile. Initially tachycardic to 110s. Heart regular rhythm. Lungs clear to auscultation bilaterally. Abdomen soft, non-distended, with mild diffuse TTP, worse in epigastrium. No rebound, guarding, or masses.  CBC remarkable for leukocytosis of 12.6, hemoglobin 8.4 (patient states she has heavy periods and just finished her menstrual cycle yesterday). CMP remarkable for potassium 3.4, repleted in the ED. Lipase within normal limits. Beta hCG negative. Hemoccult negative. UA small leukocytes, 0-5 WBC, rare bacteria.   Patient given fluids and pain medication for symptoms. Will give GI cocktail, as patient reports persistent epigastric pain. On repeat abdominal exam, patient has mild TTP in epigastric region.   Prior to discharge, BP 147/118, HR 98 (with previous  recording, patient's BP cuff was too small, reading likely inaccurate). Per patient, she used to take medication for HTN, however has not taken anything in several years. No signs of hypertensive urgency or emergency in the ED. Patient denies headache, lightheadedness, dizziness, vision changes.  Patient to follow-up with PCP regarding management of HTN.   Patient reports improvement in abdominal pain s/p GI cocktail.  Patient is non-toxic and well-appearing, feel she is stable for discharge at this time.  Will give iron for anemia - advised patient to have hemoglobin rechecked by PCP. Will give prilosec and carafate for abdominal pain. Return precautions discussed at length. Patient verbalizes her understanding and is in agreement with plan.       Mady Gemmalizabeth C Westfall, PA-C 06/08/15 [id number]  Pricilla LovelessScott Goldston, MD 06/08/15 [phone]

## 2015-06-07 NOTE — ED Notes (Signed)
Pt reports onset of generalized abd pain one week ago. Having lack of appetite, no vomiting or diarrhea.

## 2015-06-07 NOTE — Discharge Instructions (Signed)
1. Medications: prilosec, carafate, iron, usual home medications 2. Treatment: rest, drink plenty of fluids 3. Follow Up: please followup with your primary doctor for discussion of your diagnoses and further evaluation after today's visit (and for recheck of hemoglobin and blood pressure management); if you do not have a primary care doctor use the resource guide provided to find one; please return to the ER for severe abdominal pain, persistent vomiting, new or worsening symptoms   Abdominal Pain, Adult Many things can cause abdominal pain. Usually, abdominal pain is not caused by a disease and will improve without treatment. It can often be observed and treated at home. Your health care provider will do a physical exam and possibly order blood tests and X-rays to help determine the seriousness of your pain. However, in many cases, more time must pass before a clear cause of the pain can be found. Before that point, your health care provider may not know if you need more testing or further treatment. HOME CARE INSTRUCTIONS Monitor your abdominal pain for any changes. The following actions may help to alleviate any discomfort you are experiencing:  Only take over-the-counter or prescription medicines as directed by your health care provider.  Do not take laxatives unless directed to do so by your health care provider.  Try a clear liquid diet (broth, tea, or water) as directed by your health care provider. Slowly move to a bland diet as tolerated. SEEK MEDICAL CARE IF:  You have unexplained abdominal pain.  You have abdominal pain associated with nausea or diarrhea.  You have pain when you urinate or have a bowel movement.  You experience abdominal pain that wakes you in the night.  You have abdominal pain that is worsened or improved by eating food.  You have abdominal pain that is worsened with eating fatty foods.  You have a fever. SEEK IMMEDIATE MEDICAL CARE IF:  Your pain does  not go away within 2 hours.  You keep throwing up (vomiting).  Your pain is felt only in portions of the abdomen, such as the right side or the left lower portion of the abdomen.  You pass bloody or black tarry stools. MAKE SURE YOU:  Understand these instructions.  Will watch your condition.  Will get help right away if you are not doing well or get worse.   This information is not intended to replace advice given to you by your health care provider. Make sure you discuss any questions you have with your health care provider.   Document Released: 02/22/2005 Document Revised: 02/03/2015 Document Reviewed: 01/22/2013 Elsevier Interactive Patient Education 2016 [employer].   Emergency Department Resource Guide 1) Find a Doctor and Pay Out of Pocket Although you won't have to find out who is covered by your insurance plan, it is a good idea to ask around and get recommendations. You will then need to call the office and see if the doctor you have chosen will accept you as a new patient and what types of options they offer for patients who are self-pay. Some doctors offer discounts or will set up payment plans for their patients who do not have insurance, but you will need to ask so you aren't surprised when you get to your appointment.  2) Contact Your Local Health Department Not all health departments have doctors that can see patients for sick visits, but many do, so it is worth a call to see if yours does. If you don't know where your local health  department is, you can check in your phone book. The CDC also has a tool to help you locate your state's health department, and many state websites also have listings of all of their local health departments.  3) Find a Walk-in Clinic If your illness is not likely to be very severe or complicated, you may want to try a walk in clinic. These are popping up all over the country in pharmacies, drugstores, and shopping centers. They're usually  staffed by nurse practitioners or physician assistants that have been trained to treat common illnesses and complaints. They're usually fairly quick and inexpensive. However, if you have serious medical issues or chronic medical problems, these are probably not your best option.  No Primary Care Doctor: - Call Health Connect at  [phone] - they can help you locate a primary care doctor that  accepts your insurance, provides certain services, etc. - Physician Referral Service- [phone]  Chronic Pain Problems: Organization         Address  Phone   Notes  [facility]  [phone]) [phone] Patients need to be referred by their primary care doctor.   Medication Assistance: Organization         Address  Phone   Notes  [facility] [address] [place], [place] [address] [phone] --Must be a resident of [place] -- Must have NO insurance coverage whatsoever (no Medicaid/ Medicare, etc.) -- The pt. MUST have a primary care doctor that directs their care regularly and follows them in the community   MedAssist  [phone]   [employer]  [phone]    Agencies that provide inexpensive medical care: Organization         Address  Phone   Notes  [facility]  [phone]   [facility]    [phone]) [phone]   [facility] [address] [place], [place] [address] [phone]) [phone]   [facility] [address], [place] [phone]   [facility]anned Parenthood    [phone](336) [phone]   [facility]    [phone](336) [phone]   [facility]  [address], [place] Phone:  [phone], Fax:  [phone]) [phone] Hours of Operation:  9 am - 6 pm, M-F.  Also accepts Medicaid/Medicare and self-pay.  [facility]  [address], [place] Phone: [phone], Fax: [phone]) [phone]. Hours of  Operation:  8:30 am - 5:30 pm, M-F.  Also accepts Medicaid and self-pay.  [facility] [place] [address], [place] Phone: [phone](336) [phone]   [facility] [address], [place], [place] [phone], Ext. [phone] Mondays & Thursdays: 7-9 AM.  First 15 patients are seen on a first come, first serve basis.    Medicaid-accepting [facility] Providers:  Organization         Address  Phone   Notes  [facility] [address], [place] [phone]) [phone] Also accepts self-pay patients.  [facility] [address], [place]  [phone]) [phone]   [facility] [address], [place] [phone]) [phone]   [facility] [address] [place] [address], [place] [phone]) [phone]   Renaye RakersVeita Bland [address], [place]   [phone] Only accepts [place] Access [place] patients after they have  their name applied to their card.   Self-Pay (no insurance) in [facility]:  Organization         Address  Phone   Notes  Sickle Cell Patients, [facility] [address] [place], [place] [phone]   [facility] [address] [place], [place] [phone]   [facility] [place]  [address], [place] [phone]   [facility]/Dr. Osei-Bonsu  [address] [place] or [address], [place] [phone] Phone number for both [place] and [place] locations is the same.  [facility] [address], [place] [phone]   [facility] [address] [place] or [address] [phone]   [facility] [address] [place] [phone], phone; [phone], fax Sees patients 1st and 3rd Saturday of every month.  Must not qualify for public or private insurance (i.e. Medicaid, Medicare,  [place] Health Choice, Veterans' Benefits)  Household income should be no more than 200% of the poverty level The clinic cannot treat you if you are pregnant or think you are pregnant  Sexually transmitted diseases are not treated at the clinic.    Dental Care: Organization         Address  Phone  Notes  [facility] [address] [place], [place] [phone] Accepts children up to age 70 who are enrolled in [place] or [place] Health Choice; pregnant women with a Medicaid card; and children who have applied for Medicaid or [place] Health Choice, but were declined, whose parents can pay a reduced fee at time of service.  [facility]  [address], [place] [phone] Accepts children up to age 42 who are enrolled in [place] or [place] Health Choice; pregnant women with a Medicaid card; and children who have applied for Medicaid or  Health Choice, but were declined, whose parents can pay a reduced fee at time of service.  [facility]  [address] [place], [place] [phone] Patients are seen by appointment only. Walk-ins are not accepted. [facility] will see patients 50 years of age and older. Monday - Tuesday (8am-5pm) Most Wednesdays (8:30-5pm) $30 per visit, cash only  [facility] Access [facility]  [address], [facility] [phone] Patients are seen by appointment only. Walk-ins are not accepted. [facility] will see patients 89 years of age and older. One Wednesday Evening (Monthly: Volunteer Based).  $30 per visit, cash only  [employer]  [phone] for adults; Children under age 81, call Graduate Pediatric Dentistry at [phone]. Children aged 102-14, please call [phone] to request a pediatric application.  Dental services are provided in all areas of dental care including fillings, crowns and bridges,  complete and partial dentures, implants, gum treatment, root canals, and extractions. Preventive care is also provided. Treatment is provided to both adults and children. Patients are selected via a lottery and there is often a waiting list.   [facility] [address] [place]  [phone] [email or web address]   [facility] [address] [place], [place] [phone], Ext. [phone] Second and Fourth Thursday of each month, opens at 6:30 AM; Clinic ends at 9 AM.  Patients are seen on  a [employer]	[place] basis, and a limited number are seen during each clinic.   [facility]  [address] Ether [address]ins [place], [place] [phone]   Eligibility Requirements You must have lived in [place], [place], or [place] counties for at least the last three months.   You cannot be eligible for state or federal sponsored [place], including [employer], [place], or [employer].   You generally cannot be eligible for healthcare insurance through your employer.    How to apply: Eligibility screenings are held every Tuesday and Wednesday afternoon from 1:00 pm until 4:00 pm. You do not need an appointment for the interview!  [facility] [address], [place], [place] [phone]   [facility] Health Department  [phone]   [facility] Health Department  [phone]   [facility] Health Department  [phone]    Behavioral Health Resources in the Community: Intensive Outpatient Programs Organization         Address  Phone  Notes  [facility] [address] [place], [place] [phone]   [facility]patient [address] [place], [place] [phone]   [facility]: [facility] [address] [place], [place]  [phone]   [facility] Mental Health [address]  [place], [place] [phone] or [phone]   Substance Abuse Resources Organization          Address  Phone  Notes  [facility] Drug Services  [phone]   [facility]  [phone]   [facility] [place]  [phone]   Floydene Flock  [phone]   Residential & Outpatient Substance Abuse Program  [phone]   Psychological Services Organization         Address  Phone  Notes  [facility]  [phone]   [facility]  [phone]   [facility] [address] [place] [phone] or [phone]    Mobile Crisis Teams Organization         Address  Phone  Notes  [facility], Mobile Crisis Care Unit  [phone]   [facility]  [address] [place], [place] [phone]   Doristine Locks [address] [place] [phone]    Self-Help/Support Groups Organization         Address  Phone             Notes  [facility] [place] - variety of support groups  [phone]- I7437963 Call for more information  Narcotics Anonymous (NA), [facility] [address], [employer] [place]  2 meetings at this location   Statistician         Address  Phone  Notes  [facility] [address],    [place]  [phone]   [facility]  [address], [place] [address], [place], [place] [phone]   [facility] [address] [place], [place] [phone] Admissions: 8am-3pm M-F  [facility] [address],    [place], [place] [phone]   [facility] [address] [place], [place] [phone]   [facility] [address]  [place], [place] [phone]   [facility] - Intensive Outpatient [address], [place], [place] [phone]   [facility] ([facility] [address] [place]  [place], [place] [phone] or [phone]   [facility] ([facility]) [address] [place], [place]  [place] Medicaid  Fellowship [address]  [place] [phone] Substance Abuse/Addiction Treatment   [facility] Organization         Address  Phone  Notes  [facility]  [phone]   Domenic Schwab, PhD [address] [place] [place], [place]   [phone] or [phone]   [place], [place] [phone]   [address], [place], [place] [phone] Insurance/Medicaid/sponsorship through [facility] and Families [address] [place]                                    [place], [place] [phone] [place] [address], [place] [phone]    Dr. Adele Schilder  [phone]   [facility] Dept. 1) [address] [place] [address], [place] 2) West 3)  [address], [place] [phone]  [phone]   [place] [phone] or [phone] (After Hours)

## 2015-07-27 ENCOUNTER — Emergency Department (HOSPITAL_COMMUNITY)
Admission: EM | Admit: 2015-07-27 | Discharge: 2015-07-28 | Disposition: A | Discharge status: Home / Self Care | Payer: Self-pay | Attending: Emergency Medicine | Admitting: Emergency Medicine

## 2015-07-27 ENCOUNTER — Encounter (HOSPITAL_COMMUNITY): Payer: Self-pay | Admitting: *Deleted

## 2015-07-27 DIAGNOSIS — R1110 Vomiting, unspecified: Secondary | ICD-10-CM

## 2015-07-27 DIAGNOSIS — R112 Nausea with vomiting, unspecified: Secondary | ICD-10-CM | POA: Insufficient documentation

## 2015-07-27 DIAGNOSIS — R109 Unspecified abdominal pain: Secondary | ICD-10-CM | POA: Insufficient documentation

## 2015-07-27 DIAGNOSIS — Z3202 Encounter for pregnancy test, result negative: Secondary | ICD-10-CM | POA: Insufficient documentation

## 2015-07-27 DIAGNOSIS — Z79899 Other long term (current) drug therapy: Secondary | ICD-10-CM | POA: Insufficient documentation

## 2015-07-27 DIAGNOSIS — R630 Anorexia: Secondary | ICD-10-CM | POA: Insufficient documentation

## 2015-07-27 DIAGNOSIS — R5383 Other fatigue: Secondary | ICD-10-CM | POA: Insufficient documentation

## 2015-07-27 DIAGNOSIS — R197 Diarrhea, unspecified: Secondary | ICD-10-CM | POA: Insufficient documentation

## 2015-07-27 DIAGNOSIS — M791 Myalgia: Secondary | ICD-10-CM | POA: Insufficient documentation

## 2015-07-27 LAB — COMPREHENSIVE METABOLIC PANEL
ALT: 42 U/L (ref 14–54)
AST: 36 U/L (ref 15–41)
Albumin: 4 g/dL (ref 3.5–5.0)
Alkaline Phosphatase: 71 U/L (ref 38–126)
Anion gap: 12 (ref 5–15)
BUN: 9 mg/dL (ref 6–20)
CHLORIDE: 107 mmol/L (ref 101–111)
CO2: 23 mmol/L (ref 22–32)
CREATININE: 0.76 mg/dL (ref 0.44–1.00)
Calcium: 9.5 mg/dL (ref 8.9–10.3)
Glucose, Bld: 87 mg/dL (ref 65–99)
Potassium: 3.5 mmol/L (ref 3.5–5.1)
Sodium: 142 mmol/L (ref 135–145)
Total Bilirubin: 0.5 mg/dL (ref 0.3–1.2)
Total Protein: 8.1 g/dL (ref 6.5–8.1)

## 2015-07-27 LAB — CBC
HCT: 31.5 % — ABNORMAL LOW (ref 36.0–46.0)
Hemoglobin: 9.4 g/dL — ABNORMAL LOW (ref 12.0–15.0)
MCH: 22 pg — ABNORMAL LOW (ref 26.0–34.0)
MCHC: 29.8 g/dL — ABNORMAL LOW (ref 30.0–36.0)
MCV: 73.8 fL — AB (ref 78.0–100.0)
PLATELETS: 408 10*3/uL — AB (ref 150–400)
RBC: 4.27 MIL/uL (ref 3.87–5.11)
RDW: 18 % — ABNORMAL HIGH (ref 11.5–15.5)
WBC: 15.8 10*3/uL — AB (ref 4.0–10.5)

## 2015-07-27 LAB — LIPASE, BLOOD: LIPASE: 22 U/L (ref 11–51)

## 2015-07-27 LAB — URINE MICROSCOPIC-ADD ON

## 2015-07-27 LAB — URINALYSIS, ROUTINE W REFLEX MICROSCOPIC
Bilirubin Urine: NEGATIVE
Glucose, UA: NEGATIVE mg/dL
KETONES UR: NEGATIVE mg/dL
NITRITE: NEGATIVE
PROTEIN: 30 mg/dL — AB
Specific Gravity, Urine: 1.023 (ref 1.005–1.030)
pH: 5 (ref 5.0–8.0)

## 2015-07-27 LAB — PREGNANCY, URINE: Preg Test, Ur: NEGATIVE

## 2015-07-27 LAB — I-STAT CG4 LACTIC ACID, ED: Lactic Acid, Venous: 1.74 mmol/L (ref 0.5–2.0)

## 2015-07-27 MED ORDER — ACETAMINOPHEN 325 MG PO TABS
650.0000 mg | ORAL_TABLET | Freq: Once | ORAL | Status: AC
  Administered 2015-07-27: 650 mg via ORAL

## 2015-07-27 MED ORDER — ACETAMINOPHEN 325 MG PO TABS
ORAL_TABLET | ORAL | Status: AC
  Administered 2015-07-27: 650 mg via ORAL
  Filled 2015-07-27: qty 2

## 2015-07-27 MED ORDER — ONDANSETRON 4 MG PO TBDP
4.0000 mg | ORAL_TABLET | Freq: Once | ORAL | Status: AC
  Administered 2015-07-27: 4 mg via ORAL
  Filled 2015-07-27: qty 1

## 2015-07-27 MED ORDER — ONDANSETRON 4 MG PO TBDP
ORAL_TABLET | ORAL | Status: AC
  Filled 2015-07-27: qty 1

## 2015-07-27 MED ORDER — ONDANSETRON 4 MG PO TBDP
4.0000 mg | ORAL_TABLET | Freq: Once | ORAL | Status: AC | PRN
  Administered 2015-07-27: 4 mg via ORAL

## 2015-07-27 NOTE — ED Notes (Signed)
Pt in c/o abd pain, body aches, fever, cold chills, also n/v, no distress noted

## 2015-07-27 NOTE — ED Provider Notes (Signed)
CSN: [id number]     Arrival date & time 07/27/15  1932 History  By signing my name below, I, Dana Allison, attest that this documentation has been prepared under the direction and in the presence of Dana Rhine, MD.   Electronically Signed: [name], ED Scribe. 07/27/2015. 12:34 AM      Chief Complaint  Patient presents with  . Generalized Body Aches  . Abdominal Pain     Patient is a 30 y.o. female presenting with abdominal pain. The history is provided by the patient. No language interpreter was used.  Abdominal Pain Pain location:  Generalized Pain quality: cramping   Pain radiates to:  Does not radiate Pain severity:  Moderate Onset quality:  Gradual Duration:  1 day Timing:  Constant Progression:  Unable to specify Chronicity:  Chronic Context: not recent travel   Ineffective treatments:  None tried Associated symptoms: diarrhea, fatigue, nausea and vomiting   Associated symptoms: no dysuria and no hematuria   Diarrhea:    Quality:  Unable to specify   Severity:  Unable to specify   Duration:  1 day   Timing:  Unable to specify Fatigue:    Severity:  Mild   Duration:  7 days   Timing:  Constant   Progression:  Unable to specify Nausea:    Severity:  Moderate   Onset quality:  Gradual   Timing:  Constant   Progression:  Unable to specify Risk factors: not elderly     HPI Comments: Dana Allison is a 30 y.o. female who presents to the Emergency Department complaining of gradual onset, diarrhea, onset earlier today. Pt also reports painful bowel movements, abdominal pain, nausea vomiting, loss of appetite, and fatigue. No worsening or alleviating factors noted. Pt denies dysuria, hematochezia, hematemesis, or any other pertinent symptoms.   PMH - none Social History  Substance Use Topics  . Smoking status: Never Smoker   . Smokeless tobacco: None  . Alcohol Use: No   OB History    No data available     Review of Systems  Constitutional:  Positive for appetite change and fatigue.  Gastrointestinal: Positive for nausea, vomiting, abdominal pain and diarrhea. Negative for blood in stool.  Genitourinary: Negative for dysuria and hematuria.  All other systems reviewed and are negative.     Allergies  Review of patient's allergies indicates no known allergies.  Home Medications   Prior to Admission medications   Medication Sig Start Date End Date Taking? Authorizing Provider  ferrous sulfate 325 (65 FE) MG tablet Take 1 tablet (325 mg total) by mouth daily. 06/07/15  Yes Mady Gemma, PA-C  ibuprofen (ADVIL,MOTRIN) 800 MG tablet Take 1 tablet (800 mg total) by mouth every 8 (eight) hours as needed for mild pain. Patient not taking: Reported on 07/27/2015 12/23/14   Dana Maw Ward, DO  omeprazole (PRILOSEC) 20 MG capsule Take 1 capsule (20 mg total) by mouth daily. Patient not taking: Reported on 07/27/2015 06/07/15   Mady Gemma, PA-C  oxyCODONE-acetaminophen (PERCOCET/ROXICET) 5-325 MG per tablet Take 1 tablet by mouth every 4 (four) hours as needed. Patient not taking: Reported on 07/27/2015 12/23/14   Dana N Ward, DO  sucralfate (CARAFATE) 1 g tablet Take 1 tablet (1 g total) by mouth 4 (four) times daily -  with meals and at bedtime. Patient not taking: Reported on 07/27/2015 06/07/15   Mady Gemma, PA-C   BP 154/95 mmHg  Pulse 110  Temp(Src) 99 F (37.2 C) (Oral)  Resp 20  Ht  (1.727 m)  Wt 234 lb (106.142 kg)  BMI 35.59 kg/m2  SpO2 100%  LMP 07/20/2015 Physical Exam CONSTITUTIONAL: Well developed/well nourished HEAD: Normocephalic/atraumatic EYES: EOMI/PERRL, no icterus ENMT: Mucous membranes dry NECK: supple no meningeal signs SPINE/BACK:entire spine nontender CV: S1/S2 noted, no murmurs/rubs/gallops noted LUNGS: Lungs are clear to auscultation bilaterally, no apparent distress ABDOMEN: soft, nontender, no rebound or guarding, bowel sounds noted throughout abdomen GU:no cva  tenderness NEURO: Pt is awake/alert/appropriate, moves all extremitiesx4.  No facial droop.   EXTREMITIES: pulses normal/equal, full ROM SKIN: warm, color normal PSYCH: no abnormalities of mood noted, alert and oriented to situation  ED Course  Procedures   Medications  ondansetron (ZOFRAN-ODT) disintegrating tablet 4 mg (4 mg Oral Given 07/27/15 1947)  acetaminophen (TYLENOL) tablet 650 mg (650 mg Oral Given 07/27/15 1947)  ondansetron (ZOFRAN-ODT) disintegrating tablet 4 mg (4 mg Oral Given 07/27/15 2331)  sodium chloride 0.9 % bolus 1,000 mL (1,000 mLs Intravenous New Bag/Given 07/28/15 0042)  ketorolac (TORADOL) 30 MG/ML injection 30 mg (30 mg Intravenous Given 07/28/15 0104)    DIAGNOSTIC STUDIES: Oxygen Saturation is 100% on RA, normal by my interpretation.    COORDINATION OF CARE: 11:17 PM-Discussed treatment plan which includes urinalysis, CMP, lipase, Zofran, and tylenol with pt at bedside and pt agreed to plan.   1:45 AM Pt was tachycardic upon standing She was given IV fluids She is improved She is not vomiting She did not have significant abdominal findings on exam Since she had both vomiting and diarrhea, suspect viral illness She is appropriate for d/c home   Labs Review Labs Reviewed  CBC - Abnormal; Notable for the following:    WBC 15.8 (*)    Hemoglobin 9.4 (*)    HCT 31.5 (*)    MCV 73.8 (*)    MCH 22.0 (*)    MCHC 29.8 (*)    RDW 18.0 (*)    Platelets 408 (*)    All other components within normal limits  URINALYSIS, ROUTINE W REFLEX MICROSCOPIC (NOT AT [facility]) - Abnormal; Notable for the following:    Color, Urine AMBER (*)    APPearance CLOUDY (*)    Hgb urine dipstick LARGE (*)    Protein, ur 30 (*)    Leukocytes, UA SMALL (*)    All other components within normal limits  URINE MICROSCOPIC-ADD ON - Abnormal; Notable for the following:    Squamous Epithelial / LPF 0-5 (*)    Bacteria, UA MANY (*)    Casts HYALINE CASTS (*)    All other components  within normal limits  LIPASE, BLOOD  COMPREHENSIVE METABOLIC PANEL  PREGNANCY, URINE  I-STAT CG4 LACTIC ACID, ED   I have personally reviewed and evaluated these lab results as part of my medical decision-making.   MDM   Final diagnoses:  Vomiting and diarrhea  Abdominal pain, unspecified abdominal location   Nursing notes including past medical history and social history reviewed and considered in documentation Labs/vital reviewed myself and considered during evaluation   I personally performed the services described in this documentation, which was scribed in my presence. The recorded information has been reviewed and is accurate.      Dana Rhine, MD 07/28/15 [phone]

## 2015-07-28 MED ORDER — KETOROLAC TROMETHAMINE 30 MG/ML IJ SOLN
30.0000 mg | Freq: Once | INTRAMUSCULAR | Status: AC
  Administered 2015-07-28: 30 mg via INTRAVENOUS
  Filled 2015-07-28: qty 1

## 2015-07-28 MED ORDER — SODIUM CHLORIDE 0.9 % IV BOLUS (SEPSIS)
1000.0000 mL | Freq: Once | INTRAVENOUS | Status: AC
  Administered 2015-07-28: 1000 mL via INTRAVENOUS

## 2015-07-28 MED ORDER — ONDANSETRON 8 MG PO TBDP: ORAL_TABLET | ORAL | Status: DC

## 2015-07-28 NOTE — Discharge Instructions (Signed)

## 2015-12-02 ENCOUNTER — Encounter (HOSPITAL_COMMUNITY): Payer: Self-pay | Admitting: Emergency Medicine

## 2017-01-20 ENCOUNTER — Encounter (HOSPITAL_COMMUNITY): Payer: Self-pay

## 2017-01-20 ENCOUNTER — Inpatient Hospital Stay (HOSPITAL_COMMUNITY)
Admission: AD | Admit: 2017-01-20 | Discharge: 2017-01-20 | Disposition: A | Discharge status: Home / Self Care | Payer: Self-pay | Source: Ambulatory Visit | Attending: Obstetrics and Gynecology | Admitting: Obstetrics and Gynecology

## 2017-01-20 DIAGNOSIS — N926 Irregular menstruation, unspecified: Secondary | ICD-10-CM | POA: Insufficient documentation

## 2017-01-20 DIAGNOSIS — Z833 Family history of diabetes mellitus: Secondary | ICD-10-CM | POA: Insufficient documentation

## 2017-01-20 DIAGNOSIS — B9689 Other specified bacterial agents as the cause of diseases classified elsewhere: Secondary | ICD-10-CM | POA: Insufficient documentation

## 2017-01-20 DIAGNOSIS — Z8249 Family history of ischemic heart disease and other diseases of the circulatory system: Secondary | ICD-10-CM | POA: Insufficient documentation

## 2017-01-20 DIAGNOSIS — N760 Acute vaginitis: Secondary | ICD-10-CM | POA: Insufficient documentation

## 2017-01-20 DIAGNOSIS — Z79899 Other long term (current) drug therapy: Secondary | ICD-10-CM | POA: Insufficient documentation

## 2017-01-20 DIAGNOSIS — I10 Essential (primary) hypertension: Secondary | ICD-10-CM | POA: Insufficient documentation

## 2017-01-20 HISTORY — DX: Essential (primary) hypertension: I10

## 2017-01-20 LAB — URINALYSIS, ROUTINE W REFLEX MICROSCOPIC
Bilirubin Urine: NEGATIVE
GLUCOSE, UA: NEGATIVE mg/dL
Hgb urine dipstick: NEGATIVE
Ketones, ur: NEGATIVE mg/dL
NITRITE: NEGATIVE
Protein, ur: NEGATIVE mg/dL
Specific Gravity, Urine: 1.021 (ref 1.005–1.030)
pH: 5 (ref 5.0–8.0)

## 2017-01-20 LAB — WET PREP, GENITAL
Sperm: NONE SEEN
Trich, Wet Prep: NONE SEEN
Yeast Wet Prep HPF POC: NONE SEEN

## 2017-01-20 LAB — POCT PREGNANCY, URINE: Preg Test, Ur: NEGATIVE

## 2017-01-20 MED ORDER — METRONIDAZOLE 500 MG PO TABS: 500.0000 mg | ORAL_TABLET | Freq: Two times a day (BID) | ORAL | 0 refills | Status: DC

## 2017-01-20 NOTE — MAU Provider Note (Signed)
History     CSN: [id number]  Arrival date and time: 01/20/17 4098   First Provider Initiated Contact with Patient 01/20/17 [phone]      Chief Complaint  Patient presents with  . Vaginal Pain  . Amenorrhea   HPI  Ms. Dana Allison is a 31 y.o. who presents to MAU today with complaint of vaginal pain and amenorrhea. The patient states that she usually has regular cycles, but hasn't had a period since 11/08/16. She had negative HPT recently. She is sexually active and uses condoms sometimes. She states that she has been having discomfort and itching vaginally. She has also had a brownish discharge. She denies bleeding or fever today. She states that she noted burning with urination once after intercourse ~ 2 weeks ago and has not had intercourse since then. She denies UTI symptoms.   OB History    Gravida Para Term Preterm AB Living   0 0 0 0 0     SAB TAB Ectopic Multiple Live Births   0 0 0          Past Medical History:  Diagnosis Date  . Hypertension     Past Surgical History:  Procedure Laterality Date  . NO PAST SURGERIES      Family History  Problem Relation Age of Onset  . Hypertension Mother   . Diabetes Mother   . Diabetes Father     Social History  Substance Use Topics  . Smoking status: Never Smoker  . Smokeless tobacco: Never Used  . Alcohol use Yes     Comment: occasionally    Allergies: No Known Allergies  Prescriptions Prior to Admission  Medication Sig Dispense Refill Last Dose  . ibuprofen (ADVIL,MOTRIN) 200 MG tablet Take 400 mg by mouth every 6 (six) hours as needed for mild pain or moderate pain.    Past Month at Unknown time    Review of Systems  Constitutional: Negative for fever.  Gastrointestinal: Positive for nausea. Negative for abdominal pain, constipation, diarrhea and vomiting.  Genitourinary: Positive for vaginal discharge and vaginal pain. Negative for dysuria, frequency, urgency and vaginal bleeding.   Physical Exam   Blood  pressure (!) 159/104, pulse 91, temperature 98.2 F (36.8 C), temperature source Oral, resp. rate 18, height 5\' 8"  (1.727 m), weight 265 lb (120.2 kg), last menstrual period 11/08/2016.  Physical Exam  Nursing note and vitals reviewed. Constitutional: She is oriented to person, place, and time. She appears well-developed and well-nourished. No distress.  HENT:  Head: Normocephalic and atraumatic.  + hirsutism   Cardiovascular: Normal rate.   Respiratory: Effort normal.  GI: Soft. She exhibits no distension and no mass. There is no tenderness. There is no rebound and no guarding.  Genitourinary: Uterus is not enlarged and not tender. Cervix exhibits no motion tenderness, no discharge and no friability. Right adnexum displays no mass and no tenderness. Left adnexum displays no mass and no tenderness. No bleeding in the vagina. Vaginal discharge (moderate thin, white discharget with odor) found.  Neurological: She is alert and oriented to person, place, and time.  Skin: Skin is warm and dry. No erythema.  Psychiatric: She has a normal mood and affect.    Results for orders placed or performed during the hospital encounter of 01/20/17 (from the past 24 hour(s))  Urinalysis, Routine w reflex microscopic     Status: Abnormal   Collection Time: 01/20/17  9:30 AM  Result Value Ref Range   Color, Urine YELLOW YELLOW  APPearance HAZY (A) CLEAR   Specific Gravity, Urine 1.021 1.005 - 1.030   pH 5.0 5.0 - 8.0   Glucose, UA NEGATIVE NEGATIVE mg/dL   Hgb urine dipstick NEGATIVE NEGATIVE   Bilirubin Urine NEGATIVE NEGATIVE   Ketones, ur NEGATIVE NEGATIVE mg/dL   Protein, ur NEGATIVE NEGATIVE mg/dL   Nitrite NEGATIVE NEGATIVE   Leukocytes, UA MODERATE (A) NEGATIVE   RBC / HPF 6-30 0 - 5 RBC/hpf   WBC, UA 6-30 0 - 5 WBC/hpf   Bacteria, UA RARE (A) NONE SEEN   Squamous Epithelial / LPF 0-5 (A) NONE SEEN   Mucus PRESENT   Pregnancy, urine POC     Status: None   Collection Time: 01/20/17  9:35 AM   Result Value Ref Range   Preg Test, Ur NEGATIVE NEGATIVE  Wet prep, genital     Status: Abnormal   Collection Time: 01/20/17 10:04 AM  Result Value Ref Range   Yeast Wet Prep HPF POC NONE SEEN NONE SEEN   Trich, Wet Prep NONE SEEN NONE SEEN   Clue Cells Wet Prep HPF POC FEW (A) NONE SEEN   WBC, Wet Prep HPF POC FEW (A) NONE SEEN   Sperm NONE SEEN     MAU Course  Procedures None  MDM UPT - negative UA, wet prep, GC/Chlamydia, HIV and RPR today  Discussed with Dr. Vergie Living. Since patient is asymptomatic, ok to refer to [facility] for management and advised [facility] for warning signs/symptoms  Assessment and Plan  A: Irregular menses, possible PCOS Bacterial vaginosis  CHTN, uncontrolled  P: Discharge home Rx for Flagyl given to patient  Warning signs for worsening condition discussed Patient advised to follow-up with [facility] on Monday for HTN and [facility] if periods remain irregular Warning signs for worsening HTN discussed Patient may return to MAU as needed or if her condition were to change or worsen   Vonzella Nipple, PA-C 01/20/2017, 10:36 AM

## 2017-01-20 NOTE — MAU Note (Signed)
Reports vaginal pain for a couple of weeks, thought it was a UTI but it has been longer then 10 days. Increased fluid intake. Reports 2 weeks ago she had intercourse and then used the bathroom after and noticed some burning. Reports no burning currently and occasional increased fequency and urgency. Reports having periods every month but it has been 2 months since her last period. UPT at home 6 weeks ago was negative. No vaginal bleeding.

## 2017-01-20 NOTE — Discharge Instructions (Signed)
Bacterial Vaginosis °Bacterial vaginosis is an infection of the vagina. It happens when too many germs (bacteria) grow in the vagina. This infection puts you at risk for infections from sex (STIs). Treating this infection can lower your risk for some STIs. You should also treat this if you are pregnant. It can cause your baby to be born early. °Follow these instructions at home: °Medicines °· Take over-the-counter and prescription medicines only as told by your doctor. °· Take or use your antibiotic medicine as told by your doctor. Do not stop taking or using it even if you start to feel better. °General instructions °· If you your sexual partner is a woman, tell her that you have this infection. She needs to get treatment if she has symptoms. If you have a female partner, he does not need to be treated. °· During treatment: °? Avoid sex. °? Do not douche. °? Avoid alcohol as told. °? Avoid breastfeeding as told. °· Drink enough fluid to keep your pee (urine) clear or pale yellow. °· Keep your vagina and butt (rectum) clean. °? Wash the area with warm water every day. °? Wipe from front to back after you use the toilet. °· Keep all follow-up visits as told by your doctor. This is important. °Preventing this condition °· Do not douche. °· Use only warm water to wash around your vagina. °· Use protection when you have sex. This includes: °? Latex condoms. °? Dental dams. °· Limit how many people you have sex with. It is best to only have sex with the same person (be monogamous). °· Get tested for STIs. Have your partner get tested. °· Wear underwear that is cotton or lined with cotton. °· Avoid tight pants and pantyhose. This is most important in summer. °· Do not use any products that have nicotine or tobacco in them. These include cigarettes and e-cigarettes. If you need help quitting, ask your doctor. °· Do not use illegal drugs. °· Limit how much alcohol you drink. °Contact a doctor if: °· Your symptoms do not get  better, even after you are treated. °· You have more discharge or pain when you pee (urinate). °· You have a fever. °· You have pain in your belly (abdomen). °· You have pain with sex. °· Your bleed from your vagina between periods. °Summary °· This infection happens when too many germs (bacteria) grow in the vagina. °· Treating this condition can lower your risk for some infections from sex (STIs). °· You should also treat this if you are pregnant. It can cause early (premature) birth. °· Do not stop taking or using your antibiotic medicine even if you start to feel better. °This information is not intended to replace advice given to you by your health care provider. Make sure you discuss any questions you have with your health care provider. °Document Released: 02/22/2008 Document Revised: 01/29/2016 Document Reviewed: 01/29/2016 °Elsevier Interactive Patient Education © 2017 [employer] ° ° °Abnormal Uterine Bleeding °Abnormal uterine bleeding means bleeding more than usual from your uterus. It can include: °· Bleeding between periods. °· Bleeding after sex. °· Bleeding that is heavier than normal. °· Periods that last longer than usual. °· Bleeding after you have stopped having your period (menopause). ° °There are many problems that may cause this. You should see a doctor for any kind of bleeding that is not normal. Treatment depends on the cause of the bleeding. °Follow these instructions at home: °· Watch your condition for any changes. °·   Do not use tampons, douche, or have sex, if your doctor tells you not to. °· Change your pads often. °· Get regular well-woman exams. Make sure they include a pelvic exam and cervical cancer screening. °· Keep all follow-up visits as told by your doctor. This is important. °Contact a doctor if: °· The bleeding lasts more than one week. °· You feel dizzy at times. °· You feel like you are going to throw up (nauseous). °· You throw up. °Get help right away if: °· You pass  out. °· You have to change pads every hour. °· You have belly (abdominal) pain. °· You have a fever. °· You get sweaty. °· You get weak. °· You passing large blood clots from your vagina. °Summary °· Abnormal uterine bleeding means bleeding more than usual from your uterus. °· There are many problems that may cause this. You should see a doctor for any kind of bleeding that is not normal. °· Treatment depends on the cause of the bleeding. °This information is not intended to replace advice given to you by your health care provider. Make sure you discuss any questions you have with your health care provider. °Document Released: 03/12/2009 Document Revised: 05/09/2016 Document Reviewed: 05/09/2016 °Elsevier Interactive Patient Education © 2017 [employer] ° °

## 2017-01-21 LAB — RPR: RPR Ser Ql: NONREACTIVE

## 2017-01-21 LAB — HIV ANTIBODY (ROUTINE TESTING W REFLEX): HIV SCREEN 4TH GENERATION: NONREACTIVE

## 2017-01-22 LAB — GC/CHLAMYDIA PROBE AMP (~~LOC~~) NOT AT ARMC
Chlamydia: NEGATIVE
NEISSERIA GONORRHEA: NEGATIVE

## 2017-10-25 ENCOUNTER — Ambulatory Visit (HOSPITAL_COMMUNITY)
Admission: EM | Admit: 2017-10-25 | Discharge: 2017-10-25 | Disposition: A | Discharge status: Home / Self Care | Payer: Self-pay | Attending: Physician Assistant | Admitting: Physician Assistant

## 2017-10-25 ENCOUNTER — Other Ambulatory Visit: Payer: Self-pay

## 2017-10-25 ENCOUNTER — Encounter (HOSPITAL_COMMUNITY): Payer: Self-pay | Admitting: Emergency Medicine

## 2017-10-25 DIAGNOSIS — H66001 Acute suppurative otitis media without spontaneous rupture of ear drum, right ear: Secondary | ICD-10-CM

## 2017-10-25 DIAGNOSIS — Z3202 Encounter for pregnancy test, result negative: Secondary | ICD-10-CM

## 2017-10-25 DIAGNOSIS — I10 Essential (primary) hypertension: Secondary | ICD-10-CM

## 2017-10-25 DIAGNOSIS — N912 Amenorrhea, unspecified: Secondary | ICD-10-CM

## 2017-10-25 LAB — POCT PREGNANCY, URINE: Preg Test, Ur: NEGATIVE

## 2017-10-25 MED ORDER — AMOXICILLIN 500 MG PO CAPS: 1000.0000 mg | ORAL_CAPSULE | Freq: Two times a day (BID) | ORAL | 0 refills | Status: DC

## 2017-10-25 MED ORDER — LABETALOL HCL 100 MG PO TABS: 100.0000 mg | ORAL_TABLET | Freq: Two times a day (BID) | ORAL | 1 refills | Status: DC

## 2017-10-25 MED FILL — AMOXICILLIN 500 MG CAPSULE: 500 | 10 days supply | Qty: 40 | Fill #0

## 2017-10-25 MED FILL — LABETALOL HCL 100 MG TABLET: 100 | 30 days supply | Qty: 60 | Fill #0

## 2017-10-25 NOTE — ED Provider Notes (Addendum)
10/25/2017 11:06 AM   DOB: 1985-07-14 / MRN: [id number]  SUBJECTIVE:  Dana Allison is a 32 y.o. female presenting for right-sided ear pain and change of hearing.  Patient tells me she was sick roughly a week ago with a cold.  The ear pain started about 3 days ago.  She has a history of hypertension has been off of her medication for some time now.  She denies chest pain, shortness of breath, DOE, leg swelling, vision changes, headache.  She tells me that she has not had a full period for about 3 months.  She is not using protection and actually desires pregnancy.  She is a self-pay patient and would like to avoid lab calls if at all possible.  She has No Known Allergies.   She  has a past medical history of Hypertension.    She  reports that she has never smoked. She has never used smokeless tobacco. She reports that she drinks alcohol. She reports that she does not use drugs. She  reports that she currently engages in sexual activity. She reports using the following method of birth control/protection: None. The patient  has a past surgical history that includes No past surgeries.  Her family history includes Diabetes in her father and mother; Hypertension in her mother.  ROS per HPI  OBJECTIVE:  BP (!) 175/135 (BP Location: Left Wrist)   Pulse (!) 114   Temp 98.4 F (36.9 C) (Oral)   LMP 05/27/2017   SpO2 98%      Wt Readings from Last 3 Encounters:  01/20/17 265 lb (120.2 kg)  07/27/15 234 lb (106.1 kg)  11/23/14 263 lb (119.3 kg)   Temp Readings from Last 3 Encounters:  10/25/17 98.4 F (36.9 C) (Oral)  01/20/17 98.2 F (36.8 C) (Oral)  07/27/15 99 F (37.2 C) (Oral)   BP Readings from Last 3 Encounters:  10/25/17 (!) 175/135  01/20/17 (!) 148/104  07/28/15 122/74   Pulse Readings from Last 3 Encounters:  10/25/17 (!) 114  01/20/17 81  07/28/15 98    Physical Exam  Constitutional: She is oriented to person, place, and time. She appears well-nourished. No  distress.  HENT:  Right Ear: No tenderness. Tympanic membrane is injected, erythematous and bulging. Tympanic membrane is not perforated. Decreased hearing is noted.  Left Ear: No tenderness. Tympanic membrane is not injected, not perforated, not erythematous and not bulging. No decreased hearing is noted.  Eyes: Pupils are equal, round, and reactive to light. EOM are normal.  Cardiovascular: Normal rate, regular rhythm, S1 normal, S2 normal, normal heart sounds and intact distal pulses. Exam reveals no gallop, no friction rub and no decreased pulses.  No murmur heard. Pulmonary/Chest: Effort normal. No stridor. No respiratory distress. She has no wheezes. She has no rales.  Abdominal: She exhibits no distension and no mass. There is no tenderness. There is no rebound and no guarding. No hernia.  Musculoskeletal: She exhibits no edema, tenderness or deformity.  Neurological: She is alert and oriented to person, place, and time. She has normal strength. She displays normal reflexes. No cranial nerve deficit or sensory deficit. She exhibits normal muscle tone. She displays a negative Romberg sign. Coordination and gait normal. GCS eye subscore is 4. GCS verbal subscore is 5. GCS motor subscore is 6.  Skin: Skin is dry. She is not diaphoretic.  Psychiatric: She has a normal mood and affect. Her behavior is normal. Judgment and thought content normal.  Vitals reviewed.   Results  for orders placed or performed during the hospital encounter of 10/25/17 (from the past 72 hour(s))  Pregnancy, urine POC     Status: None   Collection Time: 10/25/17 10:38 AM  Result Value Ref Range   Preg Test, Ur NEGATIVE NEGATIVE    Comment:        THE SENSITIVITY OF THIS METHODOLOGY IS >24 mIU/mL     No results found.  ASSESSMENT AND PLAN:   Asymptomatic hypertension  Non-recurrent acute suppurative otitis media of right ear without spontaneous rupture of tympanic membrane  Obesity, morbid  (HCC)  Amenorrhea    Discharge Instructions     If at any point you begin having difficulty with speech, walking, strength or dexterity, blurred vision, dizziness please call 911 and go directly to the emergency room.  I am writing an antibiotic for your ear.  I am starting you on a blood pressure medicine that is safe in pregnancy.  I think is very important that you follow-up with a primary care provider so they can manage your blood pressure.        The patient is advised to call or return to clinic if she does not see an improvement in symptoms, or to seek the care of the closest emergency department if she worsens with the above plan.   Deliah Boston, MHS, PA-C 10/25/2017 11:06 AM   Ofilia Neas, PA-C 10/25/17 1109    Ofilia Neas, PA-C 10/25/17 [phone]

## 2017-10-25 NOTE — Discharge Instructions (Addendum)
If at any point you begin having difficulty with speech, walking, strength or dexterity, blurred vision, dizziness please call 911 and go directly to the emergency room.  I am writing an antibiotic for your ear.  I am starting you on a blood pressure medicine that is safe in pregnancy.  I think is very important that you follow-up with a primary care provider so they can manage your blood pressure.

## 2017-10-25 NOTE — ED Triage Notes (Signed)
Pt has had a "muffled" right ear for 2-3 days.  She has had some nasal congestion and a little chest congestion.

## 2018-04-29 ENCOUNTER — Inpatient Hospital Stay (HOSPITAL_COMMUNITY)
Admission: AD | Admit: 2018-04-29 | Discharge: 2018-04-29 | Disposition: A | Discharge status: Home / Self Care | Payer: Self-pay | Source: Ambulatory Visit | Attending: Obstetrics and Gynecology | Admitting: Obstetrics and Gynecology

## 2018-04-29 ENCOUNTER — Encounter (HOSPITAL_COMMUNITY): Payer: Self-pay | Admitting: *Deleted

## 2018-04-29 DIAGNOSIS — N939 Abnormal uterine and vaginal bleeding, unspecified: Secondary | ICD-10-CM | POA: Insufficient documentation

## 2018-04-29 DIAGNOSIS — Z3202 Encounter for pregnancy test, result negative: Secondary | ICD-10-CM | POA: Insufficient documentation

## 2018-04-29 LAB — URINALYSIS, ROUTINE W REFLEX MICROSCOPIC
BACTERIA UA: NONE SEEN
Bilirubin Urine: NEGATIVE
Glucose, UA: NEGATIVE mg/dL
Ketones, ur: NEGATIVE mg/dL
Nitrite: NEGATIVE
PROTEIN: NEGATIVE mg/dL
Specific Gravity, Urine: 1.02 (ref 1.005–1.030)
pH: 7 (ref 5.0–8.0)

## 2018-04-29 LAB — CBC
HEMATOCRIT: 37.4 % (ref 36.0–46.0)
Hemoglobin: 12.3 g/dL (ref 12.0–15.0)
MCH: 29.9 pg (ref 26.0–34.0)
MCHC: 32.9 g/dL (ref 30.0–36.0)
MCV: 90.8 fL (ref 80.0–100.0)
NRBC: 0 % (ref 0.0–0.2)
Platelets: 256 10*3/uL (ref 150–400)
RBC: 4.12 MIL/uL (ref 3.87–5.11)
RDW: 13.9 % (ref 11.5–15.5)
WBC: 10.8 10*3/uL — ABNORMAL HIGH (ref 4.0–10.5)

## 2018-04-29 LAB — WET PREP, GENITAL
Sperm: NONE SEEN
TRICH WET PREP: NONE SEEN
Yeast Wet Prep HPF POC: NONE SEEN

## 2018-04-29 LAB — POCT PREGNANCY, URINE: PREG TEST UR: NEGATIVE

## 2018-04-29 MED ORDER — MEGESTROL ACETATE 40 MG PO TABS: 40.0000 mg | ORAL_TABLET | Freq: Two times a day (BID) | ORAL | 1 refills | Status: DC

## 2018-04-29 MED FILL — LABETALOL HCL 100 MG TABS: 100 | 30 days supply | Qty: 60 | Fill #1

## 2018-04-29 MED FILL — MEGESTROL 40 MG TABLET: 40 | 30 days supply | Qty: 60 | Fill #0

## 2018-04-29 NOTE — MAU Note (Signed)
Pt stated her period started last month 11/1 ans has not stopped. Has slowed down at times but then it starts back up heavy again. Not using birth control. Denies any pain or cramping with the bleeding.

## 2018-04-29 NOTE — MAU Provider Note (Signed)
Chief Complaint: Vaginal Bleeding   First Provider Initiated Contact with Patient 04/29/18 1138     SUBJECTIVE HPI: Dana Allison is a 32 y.o. non pregnant female who presents to Maternity Admissions reporting vaginal bleeding. Has history of irregular menses. Reports daily vaginal bleeding since the beginning of November. States her periods are sporadic and when they come they are heavy.  Has had heavy bleeding this month but reports decrease in bleeding today. Has to change her pad 3 times per day normally.  Denies abdominal pain, headaches, dizziness, or palpitations. Does not have PCP d/t self pay status.   Past Medical History:  Diagnosis Date  . Hypertension    OB History  Gravida Para Term Preterm AB Living  0 0 0 0 0 0  SAB TAB Ectopic Multiple Live Births  0 0 0 0 0   Past Surgical History:  Procedure Laterality Date  . NO PAST SURGERIES     Social History   Socioeconomic History  . Marital status: Single    Spouse name: Not on file  . Number of children: Not on file  . Years of education: Not on file  . Highest education level: Not on file  Occupational History  . Not on file  Social Needs  . Financial resource strain: Not on file  . Food insecurity:    Worry: Not on file    Inability: Not on file  . Transportation needs:    Medical: Not on file    Non-medical: Not on file  Tobacco Use  . Smoking status: Never Smoker  . Smokeless tobacco: Never Used  Substance and Sexual Activity  . Alcohol use: Yes    Comment: occasionally  . Drug use: No  . Sexual activity: Yes    Birth control/protection: None  Lifestyle  . Physical activity:    Days per week: Not on file    Minutes per session: Not on file  . Stress: Not on file  Relationships  . Social connections:    Talks on phone: Not on file    Gets together: Not on file    Attends religious service: Not on file    Active member of club or organization: Not on file    Attends meetings of clubs or  organizations: Not on file    Relationship status: Not on file  . Intimate partner violence:    Fear of current or ex partner: Not on file    Emotionally abused: Not on file    Physically abused: Not on file    Forced sexual activity: Not on file  Other Topics Concern  . Not on file  Social History Narrative   ** Merged History Encounter **       Family History  Problem Relation Age of Onset  . Hypertension Mother   . Diabetes Mother   . Diabetes Father    No current facility-administered medications on file prior to encounter.    Current Outpatient Medications on File Prior to Encounter  Medication Sig Dispense Refill  . labetalol (NORMODYNE) 100 MG tablet Take 1 tablet (100 mg total) by mouth 2 (two) times daily. 60 tablet 1   No Known Allergies  I have reviewed patient's Past Medical Hx, Surgical Hx, Family Hx, Social Hx, medications and allergies.   Review of Systems  Constitutional: Negative.   Respiratory: Negative.   Cardiovascular: Negative.   Gastrointestinal: Negative.   Genitourinary: Positive for menstrual problem and vaginal bleeding.  Neurological: Negative for dizziness, syncope  and headaches.    OBJECTIVE Patient Vitals for the past 24 hrs:  BP Temp Pulse Resp Height Weight  04/29/18 1254 (!) 153/110 - 80 18 - -  04/29/18 1104 (!) 156/101 97.6 F (36.4 C) 85 18 5\' 8"  (1.727 m) 131.1 kg   Constitutional: Well-developed, well-nourished female in no acute distress.  Cardiovascular: normal rate & rhythm, no murmur Respiratory: normal rate and effort. Lung sounds clear throughout GI: Abd soft, non-tender, Pos BS x 4. No guarding or rebound tenderness MS: Extremities nontender, no edema, normal ROM Neurologic: Alert and oriented x 4.  GU:     SPECULUM EXAM: NEFG, minimal amount of dark red blood. Cervix pink & smooth  BIMANUAL: No CMT. Unable to palpate uterus or adnexa d/t body habitus  LAB RESULTS Results for orders placed or performed during the  hospital encounter of 04/29/18 (from the past 24 hour(s))  Urinalysis, Routine w reflex microscopic     Status: Abnormal   Collection Time: 04/29/18 11:20 AM  Result Value Ref Range   Color, Urine YELLOW YELLOW   APPearance CLEAR CLEAR   Specific Gravity, Urine 1.020 1.005 - 1.030   pH 7.0 5.0 - 8.0   Glucose, UA NEGATIVE NEGATIVE mg/dL   Hgb urine dipstick LARGE (A) NEGATIVE   Bilirubin Urine NEGATIVE NEGATIVE   Ketones, ur NEGATIVE NEGATIVE mg/dL   Protein, ur NEGATIVE NEGATIVE mg/dL   Nitrite NEGATIVE NEGATIVE   Leukocytes, UA TRACE (A) NEGATIVE   RBC / HPF 6-10 0 - 5 RBC/hpf   WBC, UA 6-10 0 - 5 WBC/hpf   Bacteria, UA NONE SEEN NONE SEEN   Squamous Epithelial / LPF 0-5 0 - 5   Mucus PRESENT   Pregnancy, urine POC     Status: None   Collection Time: 04/29/18 11:23 AM  Result Value Ref Range   Preg Test, Ur NEGATIVE NEGATIVE  Wet prep, genital     Status: Abnormal   Collection Time: 04/29/18 11:51 AM  Result Value Ref Range   Yeast Wet Prep HPF POC NONE SEEN NONE SEEN   Trich, Wet Prep NONE SEEN NONE SEEN   Clue Cells Wet Prep HPF POC PRESENT (A) NONE SEEN   WBC, Wet Prep HPF POC FEW (A) NONE SEEN   Sperm NONE SEEN   CBC     Status: Abnormal   Collection Time: 04/29/18 11:58 AM  Result Value Ref Range   WBC 10.8 (H) 4.0 - 10.5 K/uL   RBC 4.12 3.87 - 5.11 MIL/uL   Hemoglobin 12.3 12.0 - 15.0 g/dL   HCT 09.837.4 11.936.0 - 14.746.0 %   MCV 90.8 80.0 - 100.0 fL   MCH 29.9 26.0 - 34.0 pg   MCHC 32.9 30.0 - 36.0 g/dL   RDW 82.913.9 56.211.5 - 13.015.5 %   Platelets 256 150 - 400 K/uL   nRBC 0.0 0.0 - 0.2 %    IMAGING No results found.  MAU COURSE Orders Placed This Encounter  Procedures  . Wet prep, genital  . Urinalysis, Routine w reflex microscopic  . RPR  . HIV Antibody (routine testing w rflx)  . CBC  . Pregnancy, urine POC  . Discharge patient   Meds ordered this encounter  Medications  . megestrol (MEGACE) 40 MG tablet    Sig: Take 1 tablet (40 mg total) by mouth 2 (two)  times daily. Can increase to two tablets twice a day in the event of heavy bleeding    Dispense:  60 tablet  Refill:  1    Order Specific Question:   Supervising Provider    Answer:   Conan Bowens [[id number]]    MDM UPT negative Elevated BP, previously diagnosed, no meds now. Pt asymptomatic GC/CT & wet prep collected CBC done, hemoglobin normal Will give list of resources. Pt will call to establish primary care for HTN. At that point her AUB can be addressed or she can be referred if necessary.   ASSESSMENT 1. Abnormal uterine bleeding (AUB)     PLAN Discharge home in stable condition. Bleeding precautions HTN precautions Start with PCP asap Rx megace  Allergies as of 04/29/2018   No Known Allergies     Medication List    STOP taking these medications   amoxicillin 500 MG capsule Commonly known as:  AMOXIL     TAKE these medications   labetalol 100 MG tablet Commonly known as:  NORMODYNE Take 1 tablet (100 mg total) by mouth 2 (two) times daily.   megestrol 40 MG tablet Commonly known as:  MEGACE Take 1 tablet (40 mg total) by mouth 2 (two) times daily. Can increase to two tablets twice a day in the event of heavy bleeding        Judeth Horn, NP 04/29/2018  3:49 PM

## 2018-04-29 NOTE — Discharge Instructions (Signed)
Insufficient Money for Medicine:           United Way: call "211"    MAP Program at [facility] Department - [place] [phone] or [place] [phone]            No Primary Care Doctor:  To locate a primary care doctor that accepts your insurance or provides certain services:           [facility]: [phone]           Physician Referral Service: [phone] ask for My [place]  If no insurance, you need to see if you qualify for [facility] orange card, call to set      up appointment for eligibility/enrollment at [phone] or [phone] or visit [facility]. of Health and [employer] ([address], [place] and [address] -[place]) to meet with a [facility] enrollment specialist.  Agencies that provide inexpensive (sliding fee scale) medical care:       [facility] - Family Medicine at [place] - [phone]     [facility]  -  [facility] [phone]     [facility] Internal Medicine - [phone]     [facility] - [phone]     [facility] [phone]     [facility] Family [facility] - [phone]  [facility] - [facility] Child Health @ [place] - [phone]-     [phone]  [facility] - [facility] Health @ [place] - [phone]  [facility] Family Practice: [phone]   Women's Clinic: [phone]   Planned Parenthood: [phone]   [facility] of the [place] [phone]   Self Pay (no insurance) in [facility]:           Sickle Cell Patients:   [address] [place], [phone] [facility] Internal Medicine:  [address] [place] [phone]       [facility] and Wellness  [address], [place] [phone]  [facility] Family Practice:  [address] [phone]          [facility] Urgent Care           [address] [place], [phone] [facility]  [address] [place], [phone]           [facility]gent [facility] [place]           [address], [place] [phone]        [facility] - [address]           [phone], Mon-Fri 9am-7pm, [place] 9am-1pm          [facility] - Family Medicine @ [facility]          [address] [place], [phone]          [facility] - [facility]           [address], [phone] [facility] - [facility]  [address], [place] [phone]          [facility]           [address] [phone]  [facility] -  [facility]   [address] [place], [phone] [facility] - [facility]  [address], [phone]  Dr. Julio Sicks           [address], [place], [phone]          [facility] Urgent Care           [address] [phone]          [facility]             [address] [phone]          [facility]           [address] [place], [phone], 1st & 3rd Saturday every month, 10am-1pm OTHERS:  [employer]  (Immigration [employer] Only)  [phone] (Thursday only) Strategies for finding a Primary Care Provider:  1) Find a Doctor and Pay Out of Pocket  Although you won't have to find out who is covered by your insurance plan, it is a good idea to ask around and get recommendations. You will then need to call the office and see if the doctor you have chosen will accept you as a new patient and what types of options they offer for patients who are self-pay. Some doctors offer discounts or will set up payment plans for their patients who do not have insurance, but you will need to ask so you aren't surprised when you get to your appointment.   2) Contact [employer] - To see if you qualify for orange card access to healthcare safety net providers.  Call for appointment for eligibility/enrollment at [phone] or [phone]. (Uninsured, 0-200% FPL, qualifying info)  Applicants for [facility] are first required to see if they are eligible to enroll in the [facility] Marketplace before enrolling in [facility] (and get an exemption if they are not).  GCCN Criteria for acceptance is:  ? Proof of [employer] Marketing exemption - form or documentation  ? Valid photo ID (driver's license, state identification card, passport, home country ID)  ? Proof of [facility] residency (e.g. drivers license, lease/landlord information, pay stubs with address, utility bill, bank statement, etc.)  ? Proof of income (1040, last year's tax return, W2, 4 current pay stubs, other income proof)  ? Proof of assets (current bank statement + 3 most recent, disability paperwork, life insurance info, tax value on autos, etc.)  3) Contact Your Local Health Department  Not all health departments have doctors that can see patients for sick visits, but many do, so it is worth a call to see if yours does. If you don't know where your local health department is, you can check in your phone book. The CDC also has a tool to help you locate your state's health department, and many state websites also have listings of all of their local health departments.  4) Find a Walk-in Clinic  If your illness is not likely to be very severe or complicated, you may want to try a walk in clinic. These are popping up all over the country in pharmacies, drugstores, and shopping centers. They're usually staffed by nurse practitioners or physician assistants that have been trained to treat common illnesses and complaints. They're usually fairly quick and inexpensive. However, if you have serious medical issues or chronic medical problems, these are probably not your best option          Abnormal  Uterine Bleeding Abnormal uterine bleeding can affect women at various stages in life, including teenagers, women in their reproductive years, pregnant women, and women who have reached menopause. Several kinds of uterine bleeding are considered abnormal, including:  Bleeding or spotting between periods.  Bleeding after sexual intercourse.  Bleeding that is heavier or more than normal.  Periods that last longer than usual.  Bleeding after menopause.  Many cases of abnormal uterine bleeding are minor and simple to treat, while others are more serious. Any type of abnormal bleeding should be evaluated by your health care provider. Treatment will depend on the cause of the bleeding. Follow these instructions at home: Monitor your condition for any changes. The following actions may help to alleviate any discomfort you are experiencing:  Avoid the use of tampons and douches as directed by your health care provider.  Change your pads frequently.  You should get regular pelvic exams and Pap tests. Keep all follow-up appointments for diagnostic tests as directed by your health care provider. Contact a health care provider if:  Your bleeding lasts more than 1 week.  You feel dizzy at times. Get help right away if:  You pass out.  You are changing pads every 15 to 30 minutes.  You have abdominal pain.  You have a fever.  You become sweaty or weak.  You are passing large blood clots from the vagina.  You start to feel nauseous and vomit. This information is not intended to replace advice given to you by your health care provider. Make sure you discuss any questions you have with your health care provider. Document Released: 05/15/2005 Document Revised: 10/27/2015 Document Reviewed: 12/12/2012 Elsevier Interactive Patient Education  2017 [employer]

## 2018-04-30 LAB — GC/CHLAMYDIA PROBE AMP (~~LOC~~) NOT AT ARMC
Chlamydia: NEGATIVE
Neisseria Gonorrhea: NEGATIVE

## 2018-04-30 LAB — RPR: RPR: NONREACTIVE

## 2018-04-30 LAB — HIV ANTIBODY (ROUTINE TESTING W REFLEX): HIV Screen 4th Generation wRfx: NONREACTIVE

## 2018-06-21 ENCOUNTER — Inpatient Hospital Stay (HOSPITAL_COMMUNITY)
Admission: AD | Admit: 2018-06-21 | Discharge: 2018-06-21 | Disposition: A | Discharge status: Home / Self Care | Payer: Self-pay | Attending: Obstetrics and Gynecology | Admitting: Obstetrics and Gynecology

## 2018-06-21 ENCOUNTER — Encounter (HOSPITAL_COMMUNITY): Payer: Self-pay | Admitting: *Deleted

## 2018-06-21 DIAGNOSIS — I10 Essential (primary) hypertension: Secondary | ICD-10-CM | POA: Insufficient documentation

## 2018-06-21 DIAGNOSIS — N939 Abnormal uterine and vaginal bleeding, unspecified: Secondary | ICD-10-CM | POA: Insufficient documentation

## 2018-06-21 DIAGNOSIS — Z3202 Encounter for pregnancy test, result negative: Secondary | ICD-10-CM | POA: Insufficient documentation

## 2018-06-21 LAB — URINALYSIS, ROUTINE W REFLEX MICROSCOPIC
BILIRUBIN URINE: NEGATIVE
Bacteria, UA: NONE SEEN
Glucose, UA: NEGATIVE mg/dL
KETONES UR: NEGATIVE mg/dL
Nitrite: NEGATIVE
PROTEIN: NEGATIVE mg/dL
SPECIFIC GRAVITY, URINE: 1.021 (ref 1.005–1.030)
pH: 5 (ref 5.0–8.0)

## 2018-06-21 LAB — WET PREP, GENITAL
Clue Cells Wet Prep HPF POC: NONE SEEN
Sperm: NONE SEEN
Trich, Wet Prep: NONE SEEN
YEAST WET PREP: NONE SEEN

## 2018-06-21 LAB — POCT PREGNANCY, URINE: PREG TEST UR: NEGATIVE

## 2018-06-21 MED ORDER — MEGESTROL ACETATE 40 MG PO TABS: 40.0000 mg | ORAL_TABLET | Freq: Two times a day (BID) | ORAL | 1 refills | Status: DC

## 2018-06-21 MED FILL — MEGESTROL 40 MG TABLET: 40 | 15 days supply | Qty: 60 | Fill #0

## 2018-06-21 NOTE — Discharge Instructions (Signed)
Abnormal Uterine Bleeding  Abnormal uterine bleeding means bleeding more than usual from your uterus. It can include:   Bleeding between periods.   Bleeding after sex.   Bleeding that is heavier than normal.   Periods that last longer than usual.   Bleeding after you have stopped having your period (menopause).  There are many problems that may cause this. You should see a doctor for any kind of bleeding that is not normal. Treatment depends on the cause of the bleeding.  Follow these instructions at home:   Watch your condition for any changes.   Do not use tampons, douche, or have sex, if your doctor tells you not to.   Change your pads often.   Get regular well-woman exams. Make sure they include a pelvic exam and cervical cancer screening.   Keep all follow-up visits as told by your doctor. This is important.  Contact a doctor if:   The bleeding lasts more than one week.   You feel dizzy at times.   You feel like you are going to throw up (nauseous).   You throw up.  Get help right away if:   You pass out.   You have to change pads every hour.   You have belly (abdominal) pain.   You have a fever.   You get sweaty.   You get weak.   You passing large blood clots from your vagina.  Summary   Abnormal uterine bleeding means bleeding more than usual from your uterus.   There are many problems that may cause this. You should see a doctor for any kind of bleeding that is not normal.   Treatment depends on the cause of the bleeding.  This information is not intended to replace advice given to you by your health care provider. Make sure you discuss any questions you have with your health care provider.  Document Released: 03/12/2009 Document Revised: 05/09/2016 Document Reviewed: 05/09/2016  Elsevier Interactive Patient Education  2019 Elsevier Inc.

## 2018-06-21 NOTE — MAU Note (Signed)
Pt reports bleeding x one month, was seen in Nov for same and prescribed meds but they don't seem to be working.

## 2018-06-21 NOTE — MAU Provider Note (Signed)
History     CSN: [id number]  Arrival date and time: 06/21/18 1238   First Provider Initiated Contact with Patient 06/21/18 1401      Chief Complaint  Patient presents with  . Vaginal Bleeding  . Vaginal Pain   HPI Dana Allison is a 33 y.o. G0P0000 non-pregnant patient who presents to MAU for irregular vaginal bleeding and prolonged menstrual periods. This is a recurring problem for which patient was given Megace during an MAU visit in December 2019. She endorses brief break from her vaginal bleeding for most of December 2019 but then experienced a recurrence of bleeding last week. She does not know if this is her period or cause for concern. She restarted Megace using her "leftover pills" last week.  She was not able to establish a PCP due to believing the Megace had "probably fixed everything"  Pertinent Gynecological History: Menses: usually lasting less than 6 days Bleeding: intermenstrual bleeding Contraception: none DES exposure: denies Blood transfusions: none Sexually transmitted diseases: currently at risk Last pap: normal Date: Remote but within past 5 years per patient   Past Medical History:  Diagnosis Date  . Hypertension     Past Surgical History:  Procedure Laterality Date  . NO PAST SURGERIES      Family History  Problem Relation Age of Onset  . Hypertension Mother   . Diabetes Mother   . Diabetes Father     Social History   Tobacco Use  . Smoking status: Never Smoker  . Smokeless tobacco: Never Used  Substance Use Topics  . Alcohol use: Yes    Comment: occasionally  . Drug use: No    Allergies: No Known Allergies  Medications Prior to Admission  Medication Sig Dispense Refill Last Dose  . labetalol (NORMODYNE) 100 MG tablet Take 1 tablet (100 mg total) by mouth 2 (two) times daily. 60 tablet 1   . megestrol (MEGACE) 40 MG tablet Take 1 tablet (40 mg total) by mouth 2 (two) times daily. Can increase to two tablets twice a day in the event of  heavy bleeding 60 tablet 1     Review of Systems  Constitutional: Negative for chills and fever.  Respiratory: Negative for shortness of breath.   Gastrointestinal: Negative for abdominal pain.  Genitourinary: Positive for vaginal bleeding. Negative for vaginal discharge and vaginal pain.  Musculoskeletal: Negative for back pain.  Neurological: Negative for dizziness and headaches.  All other systems reviewed and are negative.  Physical Exam   Blood pressure (!) 168/104, pulse 96, temperature 98.1 F (36.7 C), temperature source Oral, resp. rate 17, height 5\' 8"  (1.727 m), weight 130.6 kg, last menstrual period 05/29/2018, SpO2 99 %.  Physical Exam  Nursing note and vitals reviewed. Constitutional: She is oriented to person, place, and time. She appears well-developed and well-nourished.  Cardiovascular: Normal rate and normal pulses.  Respiratory: Breath sounds normal.  GI: Soft. Bowel sounds are normal. She exhibits no distension. There is no abdominal tenderness. There is no rebound, no guarding and no CVA tenderness.  Genitourinary:    Vagina and uterus normal.     Genitourinary Comments: Scant vaginal bleeding removed with fox swab x 1, no new bleeding   Neurological: She is alert and oriented to person, place, and time.  Skin: Skin is warm and dry.  Psychiatric: She has a normal mood and affect. Her behavior is normal. Judgment and thought content normal.    MAU Course/MDM  Procedures: sterile speculum exam  --Chronic hypertension, on  Labetalol --Emphasized to patient that Megace is meant to be a short-term solution for abnormal uterine bleeding while patient follows up or establishes care with PCP or GYN.  Discussed that AUB can be managed with IUDs once causative factors are determined.  Patient Vitals for the past 24 hrs:  BP Temp Temp src Pulse Resp SpO2 Height Weight  06/21/18 1454 (!) 154/99 - - - - - - -  06/21/18 1301 (!) 168/104 98.1 F (36.7 C) Oral 96 17 99 %  5\' 8"  (1.727 m) 130.6 kg    Results for orders placed or performed during the hospital encounter of 06/21/18 (from the past 24 hour(s))  Urinalysis, Routine w reflex microscopic     Status: Abnormal   Collection Time: 06/21/18  1:21 PM  Result Value Ref Range   Color, Urine YELLOW YELLOW   APPearance HAZY (A) CLEAR   Specific Gravity, Urine 1.021 1.005 - 1.030   pH 5.0 5.0 - 8.0   Glucose, UA NEGATIVE NEGATIVE mg/dL   Hgb urine dipstick LARGE (A) NEGATIVE   Bilirubin Urine NEGATIVE NEGATIVE   Ketones, ur NEGATIVE NEGATIVE mg/dL   Protein, ur NEGATIVE NEGATIVE mg/dL   Nitrite NEGATIVE NEGATIVE   Leukocytes, UA SMALL (A) NEGATIVE   RBC / HPF 6-10 0 - 5 RBC/hpf   WBC, UA 11-20 0 - 5 WBC/hpf   Bacteria, UA NONE SEEN NONE SEEN   Squamous Epithelial / LPF 0-5 0 - 5   Mucus PRESENT   Pregnancy, urine POC     Status: None   Collection Time: 06/21/18  1:24 PM  Result Value Ref Range   Preg Test, Ur NEGATIVE NEGATIVE  Wet prep, genital     Status: Abnormal   Collection Time: 06/21/18  2:13 PM  Result Value Ref Range   Yeast Wet Prep HPF POC NONE SEEN NONE SEEN   Trich, Wet Prep NONE SEEN NONE SEEN   Clue Cells Wet Prep HPF POC NONE SEEN NONE SEEN   WBC, Wet Prep HPF POC FEW (A) NONE SEEN   Sperm NONE SEEN     Meds ordered this encounter  Medications  . megestrol (MEGACE) 40 MG tablet    Sig: Take 1 tablet (40 mg total) by mouth 2 (two) times daily. Can increase to two tablets twice a day in the event of heavy bleeding    Dispense:  60 tablet    Refill:  1    Order Specific Question:   Supervising Provider    Answer:   Reva Bores [[id number]]    Assessment and Plan  --33 y.o. G0P0000 non-pregnant patient --Recurrent abnormal uterine bleeding, rx Megace --Chronic HTN, on Labetalol --Discharge home in stable condition  F/U: Patient to consider [facility] walk-in clinic if she cannot establish care before she finished prescription  Calvert Cantor, CNM 06/21/2018, 3:16 PM

## 2018-06-24 LAB — GC/CHLAMYDIA PROBE AMP (~~LOC~~) NOT AT ARMC
Chlamydia: NEGATIVE
Neisseria Gonorrhea: NEGATIVE

## 2018-07-19 MED FILL — MEGESTROL 40 MG TABLET: 40 | 15 days supply | Qty: 60 | Fill #1

## 2018-08-14 MED FILL — MEGESTROL 40 MG TABLET: 40 | 15 days supply | Qty: 60 | Fill #1

## 2018-09-13 ENCOUNTER — Telehealth: Payer: Self-pay | Admitting: Obstetrics & Gynecology

## 2018-09-13 NOTE — Telephone Encounter (Signed)
Called patient about having a virtual appointment. She will download the WebEx app.

## 2018-09-16 ENCOUNTER — Encounter: Payer: Self-pay | Admitting: *Deleted

## 2018-09-18 ENCOUNTER — Telehealth: Payer: Self-pay | Admitting: Obstetrics and Gynecology

## 2018-09-18 NOTE — Telephone Encounter (Signed)
Called the patient to inform of upcoming appointment as a virtual visit. Educated the patient of the [employer] webex app and how to download. The patient verbalized understanding.

## 2018-09-19 ENCOUNTER — Encounter: Payer: Self-pay | Admitting: Obstetrics & Gynecology

## 2018-09-19 ENCOUNTER — Other Ambulatory Visit: Payer: Self-pay

## 2018-09-19 ENCOUNTER — Telehealth: Payer: Self-pay | Admitting: Obstetrics & Gynecology

## 2018-09-19 ENCOUNTER — Ambulatory Visit (INDEPENDENT_AMBULATORY_CARE_PROVIDER_SITE_OTHER): Payer: Self-pay | Admitting: Obstetrics & Gynecology

## 2018-09-19 DIAGNOSIS — N938 Other specified abnormal uterine and vaginal bleeding: Secondary | ICD-10-CM

## 2018-09-19 DIAGNOSIS — I10 Essential (primary) hypertension: Secondary | ICD-10-CM

## 2018-09-19 MED ORDER — MEGESTROL ACETATE 40 MG PO TABS: 40.0000 mg | ORAL_TABLET | Freq: Two times a day (BID) | ORAL | 3 refills | Status: DC

## 2018-09-19 MED FILL — MEGESTROL 40 MG TABLET: 40 | 15 days supply | Qty: 60 | Fill #0

## 2018-09-19 NOTE — Telephone Encounter (Signed)
Spoke with patient to inform her that I have gotten her u/s and referral to [facility] scheduled for her. Appointments were given to the patient along with the addresses and phone numbers. Patient verbalized understanding.

## 2018-09-19 NOTE — Progress Notes (Signed)
Patient ID: Dana Allison, female   DOB: 11-15-1985, 33 y.o.   MRN: [id number]   TELEHEALTH VIRTUAL GYNECOLOGY VISIT ENCOUNTER NOTE  I connected with Iantha Fallen on 09/19/18 at  8:55 AM EDT by telephone at home and verified that I am speaking with the correct person using two identifiers.   I discussed the limitations, risks, security and privacy concerns of performing an evaluation and management service by telephone and the availability of in person appointments. I also discussed with the patient that there may be a patient responsible charge related to this service. The patient expressed understanding and agreed to proceed.   History:  Dana Allison is a 33 y.o. G0P0000 female being evaluated today for h/o irregular menses with more recent prolonged DUB currently controlled with Megace that was started 3 months ago. She needs a refill. She denies any abnormal vaginal discharge, bleeding, pelvic pain or other concerns.  She has HTN and has no PCP.     Past Medical History:  Diagnosis Date  . Hypertension    Past Surgical History:  Procedure Laterality Date  . NO PAST SURGERIES     The following portions of the patient's history were reviewed and updated as appropriate: allergies, current medications, past family history, past medical history, past social history, past surgical history and problem list.   Health Maintenance:  Normal pap and negative HRHPV on 06/2018 at [facility].   Review of Systems:  Pertinent items noted in HPI and remainder of comprehensive ROS otherwise negative.  Physical Exam:   General:  Alert, oriented and cooperative.   Mental Status: Normal mood and affect perceived. Normal judgment and thought content.  Physical exam deferred due to nature of the encounter  Labs and Imaging No results found for this or any previous visit (from the past 336 hour(s)). No results found.    Assessment and Plan:     1. DUB (dysfunctional uterine bleeding) Suspect chronic  anovulation - [facility] - TestT+TestF+SHBG - US PELVIC COMPLETE WITH TRANSVAGINAL; Future - megestrol (MEGACE) 40 MG tablet; Take 1 tablet (40 mg total) by mouth 2 (two) times daily. Can increase to two tablets twice a day in the event of heavy bleeding  Dispense: 60 tablet; Refill: 3 - CBC Continue megace for now. OCP would be better except for concern with HTN  2. Essential hypertension Refer to [facility] - Ambulatory referral to Internal Medicine       I discussed the assessment and treatment plan with the patient. The patient was provided an opportunity to ask questions and all were answered. The patient agreed with the plan and demonstrated an understanding of the instructions.   The patient was advised to call back or seek an in-person evaluation/go to the ED if the symptoms worsen or if the condition fails to improve as anticipated.  I provided 15 minutes of non-face-to-face time during this encounter.   Scheryl Darter, MD [facility], [facility]

## 2018-09-19 NOTE — Patient Instructions (Signed)
Abnormal Uterine Bleeding  Abnormal uterine bleeding means bleeding more than usual from your uterus. It can include:   Bleeding between periods.   Bleeding after sex.   Bleeding that is heavier than normal.   Periods that last longer than usual.   Bleeding after you have stopped having your period (menopause).  There are many problems that may cause this. You should see a doctor for any kind of bleeding that is not normal. Treatment depends on the cause of the bleeding.  Follow these instructions at home:   Watch your condition for any changes.   Do not use tampons, douche, or have sex, if your doctor tells you not to.   Change your pads often.   Get regular well-woman exams. Make sure they include a pelvic exam and cervical cancer screening.   Keep all follow-up visits as told by your doctor. This is important.  Contact a doctor if:   The bleeding lasts more than one week.   You feel dizzy at times.   You feel like you are going to throw up (nauseous).   You throw up.  Get help right away if:   You pass out.   You have to change pads every hour.   You have belly (abdominal) pain.   You have a fever.   You get sweaty.   You get weak.   You passing large blood clots from your vagina.  Summary   Abnormal uterine bleeding means bleeding more than usual from your uterus.   There are many problems that may cause this. You should see a doctor for any kind of bleeding that is not normal.   Treatment depends on the cause of the bleeding.  This information is not intended to replace advice given to you by your health care provider. Make sure you discuss any questions you have with your health care provider.  Document Released: 03/12/2009 Document Revised: 05/09/2016 Document Reviewed: 05/09/2016  Elsevier Interactive Patient Education  2019 Elsevier Inc.

## 2018-10-14 ENCOUNTER — Encounter: Payer: Self-pay | Admitting: Family Medicine

## 2018-10-14 ENCOUNTER — Other Ambulatory Visit: Payer: Self-pay

## 2018-10-14 ENCOUNTER — Ambulatory Visit: Payer: Self-pay | Attending: Family Medicine | Admitting: Family Medicine

## 2018-10-14 VITALS — BP 151/112 | HR 107 | Temp 98.8°F | Ht 68.0 in | Wt 282.2 lb

## 2018-10-14 DIAGNOSIS — G479 Sleep disorder, unspecified: Secondary | ICD-10-CM

## 2018-10-14 DIAGNOSIS — R000 Tachycardia, unspecified: Secondary | ICD-10-CM

## 2018-10-14 DIAGNOSIS — I10 Essential (primary) hypertension: Secondary | ICD-10-CM

## 2018-10-14 DIAGNOSIS — N938 Other specified abnormal uterine and vaginal bleeding: Secondary | ICD-10-CM

## 2018-10-14 DIAGNOSIS — Z6841 Body Mass Index (BMI) 40.0 and over, adult: Secondary | ICD-10-CM

## 2018-10-14 MED ORDER — TRAZODONE HCL 50 MG PO TABS: 25.0000 mg | ORAL_TABLET | Freq: Every evening | ORAL | 1 refills | Status: DC | PRN

## 2018-10-14 MED ORDER — LABETALOL HCL 100 MG PO TABS: 100.0000 mg | ORAL_TABLET | Freq: Two times a day (BID) | ORAL | 3 refills | Status: DC

## 2018-10-14 MED ORDER — AMLODIPINE BESYLATE 5 MG PO TABS: 5.0000 mg | ORAL_TABLET | Freq: Every day | ORAL | 3 refills | Status: DC

## 2018-10-14 MED FILL — traZODone HCL 50 MG TABS: 50 | 30 days supply | Qty: 30 | Fill #0

## 2018-10-14 MED FILL — AMLODIPINE BESYLATE 5 MG TA: 5 | 30 days supply | Qty: 30 | Fill #0

## 2018-10-14 MED FILL — LABETALOL HCL 100 MG TABS: 100 | 30 days supply | Qty: 60 | Fill #0

## 2018-10-14 NOTE — Progress Notes (Signed)
New patient HTN

## 2018-10-14 NOTE — Patient Instructions (Signed)
DASH Eating Plan  DASH stands for "Dietary Approaches to Stop Hypertension." The DASH eating plan is a healthy eating plan that has been shown to reduce high blood pressure (hypertension). It may also reduce your risk for type 2 diabetes, heart disease, and stroke. The DASH eating plan may also help with weight loss.  What are tips for following this plan?    General guidelines   Avoid eating more than 2,300 mg (milligrams) of salt (sodium) a day. If you have hypertension, you may need to reduce your sodium intake to 1,500 mg a day.   Limit alcohol intake to no more than 1 drink a day for nonpregnant women and 2 drinks a day for men. One drink equals 12 oz of beer, 5 oz of wine, or 1 oz of hard liquor.   Work with your health care provider to maintain a healthy body weight or to lose weight. Ask what an ideal weight is for you.   Get at least 30 minutes of exercise that causes your heart to beat faster (aerobic exercise) most days of the week. Activities may include walking, swimming, or biking.   Work with your health care provider or diet and nutrition specialist (dietitian) to adjust your eating plan to your individual calorie needs.  Reading food labels     Check food labels for the amount of sodium per serving. Choose foods with less than 5 percent of the Daily Value of sodium. Generally, foods with less than 300 mg of sodium per serving fit into this eating plan.   To find whole grains, look for the word "whole" as the first word in the ingredient list.  Shopping   Buy products labeled as "low-sodium" or "no salt added."   Buy fresh foods. Avoid canned foods and premade or frozen meals.  Cooking   Avoid adding salt when cooking. Use salt-free seasonings or herbs instead of table salt or sea salt. Check with your health care provider or pharmacist before using salt substitutes.   Do not fry foods. Cook foods using healthy methods such as baking, boiling, grilling, and broiling instead.   Cook with  heart-healthy oils, such as olive, canola, soybean, or sunflower oil.  Meal planning   Eat a balanced diet that includes:  ? 5 or more servings of fruits and vegetables each day. At each meal, try to fill half of your plate with fruits and vegetables.  ? Up to 6-8 servings of whole grains each day.  ? Less than 6 oz of lean meat, poultry, or fish each day. A 3-oz serving of meat is about the same size as a deck of cards. One egg equals 1 oz.  ? 2 servings of low-fat dairy each day.  ? A serving of nuts, seeds, or beans 5 times each week.  ? Heart-healthy fats. Healthy fats called Omega-3 fatty acids are found in foods such as flaxseeds and coldwater fish, like sardines, salmon, and mackerel.   Limit how much you eat of the following:  ? Canned or prepackaged foods.  ? Food that is high in trans fat, such as fried foods.  ? Food that is high in saturated fat, such as fatty meat.  ? Sweets, desserts, sugary drinks, and other foods with added sugar.  ? Full-fat dairy products.   Do not salt foods before eating.   Try to eat at least 2 vegetarian meals each week.   Eat more home-cooked food and less restaurant, buffet, and fast food.     When eating at a restaurant, ask that your food be prepared with less salt or no salt, if possible.  What foods are recommended?  The items listed may not be a complete list. Talk with your dietitian about what dietary choices are best for you.  Grains  Whole-grain or whole-wheat bread. Whole-grain or whole-wheat pasta. Brown rice. Oatmeal. Quinoa. Bulgur. Whole-grain and low-sodium cereals. Pita bread. Low-fat, low-sodium crackers. Whole-wheat flour tortillas.  Vegetables  Fresh or frozen vegetables (raw, steamed, roasted, or grilled). Low-sodium or reduced-sodium tomato and vegetable juice. Low-sodium or reduced-sodium tomato sauce and tomato paste. Low-sodium or reduced-sodium canned vegetables.  Fruits  All fresh, dried, or frozen fruit. Canned fruit in natural juice (without  added sugar).  Meat and other protein foods  Skinless chicken or turkey. Ground chicken or turkey. Pork with fat trimmed off. Fish and seafood. Egg whites. Dried beans, peas, or lentils. Unsalted nuts, nut butters, and seeds. Unsalted canned beans. Lean cuts of beef with fat trimmed off. Low-sodium, lean deli meat.  Dairy  Low-fat (1%) or fat-free (skim) milk. Fat-free, low-fat, or reduced-fat cheeses. Nonfat, low-sodium ricotta or cottage cheese. Low-fat or nonfat yogurt. Low-fat, low-sodium cheese.  Fats and oils  Soft margarine without trans fats. Vegetable oil. Low-fat, reduced-fat, or light mayonnaise and salad dressings (reduced-sodium). Canola, safflower, olive, soybean, and sunflower oils. Avocado.  Seasoning and other foods  Herbs. Spices. Seasoning mixes without salt. Unsalted popcorn and pretzels. Fat-free sweets.  What foods are not recommended?  The items listed may not be a complete list. Talk with your dietitian about what dietary choices are best for you.  Grains  Baked goods made with fat, such as croissants, muffins, or some breads. Dry pasta or rice meal packs.  Vegetables  Creamed or fried vegetables. Vegetables in a cheese sauce. Regular canned vegetables (not low-sodium or reduced-sodium). Regular canned tomato sauce and paste (not low-sodium or reduced-sodium). Regular tomato and vegetable juice (not low-sodium or reduced-sodium). Pickles. Olives.  Fruits  Canned fruit in a light or heavy syrup. Fried fruit. Fruit in cream or butter sauce.  Meat and other protein foods  Fatty cuts of meat. Ribs. Fried meat. Bacon. Sausage. Bologna and other processed lunch meats. Salami. Fatback. Hotdogs. Bratwurst. Salted nuts and seeds. Canned beans with added salt. Canned or smoked fish. Whole eggs or egg yolks. Chicken or turkey with skin.  Dairy  Whole or 2% milk, cream, and half-and-half. Whole or full-fat cream cheese. Whole-fat or sweetened yogurt. Full-fat cheese. Nondairy creamers. Whipped toppings.  Processed cheese and cheese spreads.  Fats and oils  Butter. Stick margarine. Lard. Shortening. Ghee. Bacon fat. Tropical oils, such as coconut, palm kernel, or palm oil.  Seasoning and other foods  Salted popcorn and pretzels. Onion salt, garlic salt, seasoned salt, table salt, and sea salt. Worcestershire sauce. Tartar sauce. Barbecue sauce. Teriyaki sauce. Soy sauce, including reduced-sodium. Steak sauce. Canned and packaged gravies. Fish sauce. Oyster sauce. Cocktail sauce. Horseradish that you find on the shelf. Ketchup. Mustard. Meat flavorings and tenderizers. Bouillon cubes. Hot sauce and Tabasco sauce. Premade or packaged marinades. Premade or packaged taco seasonings. Relishes. Regular salad dressings.  Where to find more information:   National Heart, Lung, and Blood Institute: www.nhlbi.nih.gov   American Heart Association: www.heart.org  Summary   The DASH eating plan is a healthy eating plan that has been shown to reduce high blood pressure (hypertension). It may also reduce your risk for type 2 diabetes, heart disease, and stroke.   With the   DASH eating plan, you should limit salt (sodium) intake to 2,300 mg a day. If you have hypertension, you may need to reduce your sodium intake to 1,500 mg a day.   When on the DASH eating plan, aim to eat more fresh fruits and vegetables, whole grains, lean proteins, low-fat dairy, and heart-healthy fats.   Work with your health care provider or diet and nutrition specialist (dietitian) to adjust your eating plan to your individual calorie needs.  This information is not intended to replace advice given to you by your health care provider. Make sure you discuss any questions you have with your health care provider.  Document Released: 05/04/2011 Document Revised: 05/08/2016 Document Reviewed: 05/08/2016  Elsevier Interactive Patient Education  2019 Elsevier Inc.

## 2018-10-14 NOTE — Progress Notes (Signed)
New Patient Office Visit  Subjective:  Patient ID: Dana Allison, female    DOB: 11-Sep-1985  Age: 33 y.o. MRN: [id number]  CC:  Chief Complaint  Patient presents with  . New Patient (Initial Visit)  . Hypertension    HPI Dana Allison presents to establish care and management of chronic hypertension for which she has been on and off medication for years. Per chart review, she was seen in the ED on 10/25/17. BP at the ED was 175/135 with pulse of 114. She was placed on labetalol 100 mg twice per day. She has subsequently had GYN follow-up for amenorrhea but most recent visit was virtual and no BP available.       Days visit, patient reports that she had started having some recent headaches and restarted her blood pressure medication, labetalol.  Prior to restarting the labetalol in the last 2 weeks, she had not taken blood pressure medication for 3 or more months.  She states that she is only been taking the labetalol once daily and her last dose of labetalol was sometime yesterday afternoon.  Patient does have elevated blood pressure and elevated heart rate at today's visit.  She reports that she continues to take Megace for control of her dysfunctional uterine bleeding.  She states that in the past she was having very heavy periods before being placed on Megace by GYN.      She also reports that perhaps her elevated blood pressure is due to anxiety and difficulty with sleep.  She reports that at night when she is lying down she feels as if she cannot turn off her thoughts and she worries about things.  When she is able to fall asleep, she will wake up within the next 2 to 3 hours and then have difficulty going back to sleep.  Patient would like something to help with sleep.  Over-the-counter medications have not been effective.  Past Medical History:  Diagnosis Date  . Hypertension     Past Surgical History:  Procedure Laterality Date  . NO PAST SURGERIES    Patient states that she had  removal of a cyst from her anterior neck during early childhood but has no other information regarding this surgery  Family History  Problem Relation Age of Onset  . Hypertension Mother   . Diabetes Mother   . Diabetes Father     Social History   Tobacco Use  . Smoking status: Never Smoker  . Smokeless tobacco: Never Used  Substance Use Topics  . Alcohol use: Yes    Comment: occasionally  . Drug use: No    ROS Review of Systems  Constitutional: Positive for fatigue. Negative for chills and fever.  HENT: Negative for congestion, postnasal drip, rhinorrhea and trouble swallowing.   Eyes: Negative for photophobia and visual disturbance.  Respiratory: Negative for cough, chest tightness and shortness of breath.   Cardiovascular: Negative for chest pain, palpitations and leg swelling.  Gastrointestinal: Negative for abdominal pain, constipation, diarrhea and nausea.  Endocrine: Negative for cold intolerance, heat intolerance, polydipsia, polyphagia and polyuria.  Genitourinary: Negative for dysuria and frequency.  Musculoskeletal: Negative for arthralgias and gait problem.  Neurological: Positive for headaches. Negative for light-headedness.  Hematological: Negative for adenopathy. Does not bruise/bleed easily.  Psychiatric/Behavioral: Positive for sleep disturbance. Negative for self-injury and suicidal ideas. The patient is nervous/anxious.     Objective:   Today's Vitals: BP (!) 151/112 (BP Location: Right Arm, Patient Position: Sitting, Cuff Size: Large)  Pulse (!) 107   Temp 98.8 F (37.1 C) (Oral)   Ht 5\' 8"  (1.727 m)   Wt 282 lb 3.2 oz (128 kg)   SpO2 98%   BMI 42.91 kg/m    Physical Exam Vitals signs and nursing note reviewed.  Constitutional:      General: She is not in acute distress.    Appearance: Normal appearance. She is obese. She is not ill-appearing.  HENT:     Mouth/Throat:     Mouth: Mucous membranes are moist.     Pharynx: Oropharynx is clear.       Comments: Narrowed posterior airway due to body habitus Neck:     Musculoskeletal: Normal range of motion and neck supple. No muscular tenderness.     Comments: Large neck size Cardiovascular:     Rate and Rhythm: Regular rhythm. Tachycardia present.  Pulmonary:     Effort: Pulmonary effort is normal.     Breath sounds: Normal breath sounds.  Abdominal:     Palpations: Abdomen is soft.     Tenderness: There is no abdominal tenderness. There is no right CVA tenderness, left CVA tenderness, guarding or rebound.  Musculoskeletal:        General: No swelling or tenderness.     Right lower leg: No edema.     Left lower leg: No edema.  Lymphadenopathy:     Cervical: No cervical adenopathy.  Skin:    General: Skin is warm and dry.  Neurological:     General: No focal deficit present.     Mental Status: She is alert and oriented to person, place, and time.  Psychiatric:        Mood and Affect: Mood normal.        Behavior: Behavior normal.     Assessment & Plan:  1. Essential hypertension Patient's blood pressure remains elevated at today's visit.  I discussed with the patient that her labetalol is a twice daily medicine meaning that it will not last for a whole 24 hours and by taking the medication only once daily she may actually encounter issues with rebound hypertension.  She agrees to take medication twice daily to help with blood pressure and heart rate and new prescription also provided for amlodipine 5 mg once daily to help lower the blood pressure.  Information provided on a Dash diet as well as information on hypertension as part of her after visit summary. - Basic Metabolic Panel - amLODipine (NORVASC) 5 MG tablet; Take 1 tablet (5 mg total) by mouth daily. To lower blood pressure  Dispense: 30 tablet; Refill: 3 - labetalol (NORMODYNE) 100 MG tablet; Take 1 tablet (100 mg total) by mouth 2 (two) times daily. To lower blood pressure and heart rate  Dispense: 60 tablet;  Refill: 3  2. Sinus tachycardia Patient with sinus tachycardia and she agrees to restart the use of labetalol 100 mg twice daily to help decrease the heart rate.  She did not have evidence of anemia on most recent CBC in chart.  Patient will have BMP at today's visit to check electrolyte status.  If she continues to have tachycardia she will likely also need TSH done at her next visit.  Patient is encouraged to avoid caffeine intake. - Basic Metabolic Panel - labetalol (NORMODYNE) 100 MG tablet; Take 1 tablet (100 mg total) by mouth 2 (two) times daily. To lower blood pressure and heart rate  Dispense: 60 tablet; Refill: 3  3. Morbid obesity with BMI of 40.0-44.9,  adult [facility]) Patient also with morbid obesity which is likely contributing to her hypertension and patient with a narrowed posterior airway on exam which raises suspicion that she also has obstructive sleep apnea.  Weight loss is encouraged and patient will apply for financial resources through this office to see if she is eligible for financial discount as patient likely would also benefit from a sleep study. - Basic Metabolic Panel  4. DUB (dysfunctional uterine bleeding) Patient is currently on Megace and she reports that this is controlling her previously heavy menses.  5. Sleep disturbance Patient reports difficulty falling asleep as well as staying asleep.  She believes that she is having some underlying anxiety issues.  Prescription provided for trazodone 50 mg that she may take half or 1 whole pill at bedtime to see if this helps improve her sleep.  Also discussed with the patient that with her narrowed posterior airway, large neck size and obesity that she likely also may have sleep apnea and it would be of benefit for her to have a sleep study done at some point and she is encouraged to apply for financial assistance program in order to have sleep study covered as well as further evaluation of her hypertension and sinus  tachycardia. - traZODone (DESYREL) 50 MG tablet; Take 0.5-1 tablets (25-50 mg total) by mouth at bedtime as needed for sleep.  Dispense: 30 tablet; Refill: 1   Outpatient Encounter Medications as of 10/14/2018  Medication Sig  . labetalol (NORMODYNE) 100 MG tablet Take 1 tablet (100 mg total) by mouth 2 (two) times daily. (Patient not taking: Reported on 09/19/2018)  . megestrol (MEGACE) 40 MG tablet Take 1 tablet (40 mg total) by mouth 2 (two) times daily. Can increase to two tablets twice a day in the event of heavy bleeding   No facility-administered encounter medications on file as of 10/14/2018.     Follow-up: Return for HTN- Nurse visit check in 2 weeks; 4 weeks office.  Cain Saupe, MD

## 2018-10-15 LAB — BASIC METABOLIC PANEL WITH GFR
BUN/Creatinine Ratio: 9 (ref 9–23)
BUN: 6 mg/dL (ref 6–20)
CO2: 19 mmol/L — ABNORMAL LOW (ref 20–29)
Calcium: 9.7 mg/dL (ref 8.7–10.2)
Chloride: 105 mmol/L (ref 96–106)
Creatinine, Ser: 0.65 mg/dL (ref 0.57–1.00)
GFR calc Af Amer: 138 mL/min/1.73
GFR calc non Af Amer: 120 mL/min/1.73
Glucose: 278 mg/dL — ABNORMAL HIGH (ref 65–99)
Potassium: 4.4 mmol/L (ref 3.5–5.2)
Sodium: 139 mmol/L (ref 134–144)

## 2018-10-23 MED FILL — MEGESTROL 40 MG TABLET: 40 | 15 days supply | Qty: 60 | Fill #1

## 2018-10-28 ENCOUNTER — Other Ambulatory Visit: Payer: Self-pay | Admitting: Obstetrics & Gynecology

## 2018-10-28 ENCOUNTER — Ambulatory Visit: Payer: Self-pay

## 2018-10-28 ENCOUNTER — Ambulatory Visit: Payer: Self-pay | Attending: Family Medicine | Admitting: Emergency Medicine

## 2018-10-28 ENCOUNTER — Other Ambulatory Visit: Payer: Self-pay

## 2018-10-28 ENCOUNTER — Telehealth: Payer: Self-pay | Admitting: General Practice

## 2018-10-28 ENCOUNTER — Ambulatory Visit (HOSPITAL_COMMUNITY)
Admission: RE | Admit: 2018-10-28 | Discharge: 2018-10-28 | Disposition: A | Discharge status: Home / Self Care | Payer: Self-pay | Source: Ambulatory Visit | Attending: Obstetrics & Gynecology | Admitting: Obstetrics & Gynecology

## 2018-10-28 VITALS — BP 124/90 | HR 106 | Temp 98.7°F | Resp 19 | Ht 65.0 in | Wt 282.0 lb

## 2018-10-28 DIAGNOSIS — I10 Essential (primary) hypertension: Secondary | ICD-10-CM

## 2018-10-28 DIAGNOSIS — N938 Other specified abnormal uterine and vaginal bleeding: Secondary | ICD-10-CM

## 2018-10-28 DIAGNOSIS — E669 Obesity, unspecified: Secondary | ICD-10-CM

## 2018-10-28 NOTE — Progress Notes (Signed)
Note in chart lists blood pressure as 124/90. Please have patient continue her current medications of amlodipine and labetalol (pulse was elevated at 104 despite beta blocker use). Please ask patient to schedule follow-up appointment in about 2 weeks if she does not already have follow-up scheduled and return sooner if she has any concerns

## 2018-10-28 NOTE — Telephone Encounter (Signed)
Scheduled 6/10 @ 10am .Called patient, no answer- left message to call us back regarding results & an appt.

## 2018-10-28 NOTE — Progress Notes (Signed)
Sonohysterogram ordered to evaluate endometrial lesion seen on [place]

## 2018-10-28 NOTE — Progress Notes (Signed)
Patient arrived ambulatory, alert and orientated to clinic.  Patient is in clinic for blood pressure check.  Pressure measured in both arms five (5) minutes apart.  Patient informed as to pressures.  Patient chatted with about diet and exercise.  Patient sent to lab for A1C check.

## 2018-10-28 NOTE — Telephone Encounter (Signed)
-----   Message from Adam Phenix, MD sent at 10/28/2018  1:24 PM EDT ----- Ultrasound shows possible polyp in endometrium. Please schedule sonohysterogram, I ordered the study

## 2018-10-29 LAB — HEMOGLOBIN A1C
Est. average glucose Bld gHb Est-mCnc: 134 mg/dL
Hgb A1c MFr Bld: 6.3 % — ABNORMAL HIGH (ref 4.8–5.6)

## 2018-10-29 NOTE — Telephone Encounter (Signed)
Called pt and informed pt that she has an appt scheduled on 11/06/18 to f/u on the questionable polyp.  Pt stated understanding.  I informed pt that her appt info is in MyChart.  Pt stated that she tried to get into her MyChart but it told her code has expired.  I informed the pt that I would call her back and leave the code for on her VM.  Pt agreed.  MyChart was left on pt's VM.

## 2018-10-30 ENCOUNTER — Ambulatory Visit (HOSPITAL_COMMUNITY): Payer: Self-pay

## 2018-10-31 ENCOUNTER — Other Ambulatory Visit: Payer: Self-pay | Admitting: Family Medicine

## 2018-10-31 DIAGNOSIS — R7303 Prediabetes: Secondary | ICD-10-CM

## 2018-10-31 DIAGNOSIS — R739 Hyperglycemia, unspecified: Secondary | ICD-10-CM

## 2018-10-31 NOTE — Progress Notes (Signed)
Patient ID: Dana Allison, female   DOB: [birth year], 33 y.o.   MRN: [id number]   Patient had recent blood work with hemoglobin A1c of 6.3 but blood sugar was elevated at 278.  Patient was notified of her results by RMA and patient agrees to start metformin and monitor her blood sugars.  Prescription will be sent to pharmacy for metformin and diabetic testing supplies.  We will also refer patient to clinical pharmacist for follow-up of her blood sugars and she will be notified to bring her blood sugar diary and glucometer to her visit.

## 2018-11-01 ENCOUNTER — Other Ambulatory Visit: Payer: Self-pay | Admitting: Pharmacist

## 2018-11-01 DIAGNOSIS — R7303 Prediabetes: Secondary | ICD-10-CM

## 2018-11-01 MED ORDER — GLUCOSE BLOOD VI STRP: ORAL_STRIP | 11 refills | Status: DC

## 2018-11-01 MED ORDER — METFORMIN HCL 500 MG PO TABS: 500.0000 mg | ORAL_TABLET | Freq: Two times a day (BID) | ORAL | 3 refills | Status: DC

## 2018-11-01 MED ORDER — TRUEPLUS LANCETS 28G MISC: 11 refills | Status: DC

## 2018-11-01 MED ORDER — TRUE METRIX METER W/DEVICE KIT: PACK | 0 refills | Status: DC

## 2018-11-01 MED FILL — metFORMIN HCL 500 MG TABS: 500 | 30 days supply | Qty: 60 | Fill #0

## 2018-11-01 NOTE — Progress Notes (Signed)
Please notify patient that prescription has been sent into [facility] outpatient pharmacy for metformin 500 mg twice daily.  New prescription will be sent to this pharmacy for patient to obtain a glucometer and testing supplies.  She will also be contacted to follow-up with the clinical pharmacist in about 4 weeks regarding her prediabetes and elevated blood sugars

## 2018-11-04 MED FILL — !TRUE METRIX BLOOD GLUCOSE: 1 days supply | Qty: 1 | Fill #0

## 2018-11-04 MED FILL — TRUEplus LANCETS 28G MISC: 25 days supply | Qty: 100 | Fill #0

## 2018-11-04 MED FILL — TRUE METRIX GLUCOSE TEST ST: 25 days supply | Qty: 100 | Fill #0

## 2018-11-06 ENCOUNTER — Ambulatory Visit (HOSPITAL_COMMUNITY): Payer: Self-pay

## 2018-11-11 ENCOUNTER — Ambulatory Visit: Payer: Self-pay | Admitting: Family Medicine

## 2018-11-11 NOTE — Progress Notes (Signed)
Spoke with patient and informed her with what provider stated. Per pt she did not realize that the pharmacy did not give her the Metformin when she picked up the medications. Per pt, she will call pharmacy and inform them that they did not give her meds to her. Per pt she will pick med up tomorrow

## 2018-11-12 MED FILL — metFORMIN HCL 500 MG TABS: 500 | 30 days supply | Qty: 60 | Fill #0

## 2018-11-12 MED FILL — AMLODIPINE BESYLATE 5 MG TA: 5 | 30 days supply | Qty: 30 | Fill #1

## 2018-11-12 MED FILL — LABETALOL HCL 100 MG TABS: 100 | 30 days supply | Qty: 60 | Fill #1

## 2018-11-18 ENCOUNTER — Other Ambulatory Visit: Payer: Self-pay

## 2018-11-18 ENCOUNTER — Ambulatory Visit: Payer: Self-pay | Attending: Family Medicine | Admitting: Pharmacist

## 2018-11-18 DIAGNOSIS — R7303 Prediabetes: Secondary | ICD-10-CM

## 2018-11-18 LAB — GLUCOSE, POCT (MANUAL RESULT ENTRY): POC Glucose: 130 mg/dL — AB (ref 70–99)

## 2018-11-18 MED FILL — MEGESTROL 40 MG TABLET: 40 | 15 days supply | Qty: 60 | Fill #0

## 2018-11-18 NOTE — Progress Notes (Signed)
   S:    PCP: Dr. Chapman Fitch No chief complaint on file.  Patient arrives in good spirits. Presents for prediabetes counseling at the request of Dr. Chapman Fitch. Patient was referred on 10/28/18 after her A1c came back prediabetic.   Family/Social History:  - FHx: DM (mother, father) - Never smoker - Alcohol: "I know it messes with the metformin"  Insurance coverage/medication affordability: self-pay  Patient reports adherence with medications.  Current diabetes medications include: metformin 500 mg BID Current hypertension medications include: amlodipine 5 mg daily, labetalol 100 mg BID Current hyperlipidemia medications include: none  Patient denies hypoglycemic events.  Patient reported dietary habits: - Denies drinking any soda; does admit to apple juice - Reports staying away from sweets mostly; will have cake every once in a while - Reports intake of non-starchy vegetables  - Prefers bananas, peaches, and strawberries   Patient-reported exercise habits: walking/running every other day   Patient denies nocturia.  Patient denies neuropathy. Patient denies visual changes. Patient denies self foot exams.    O:  POCT: 130  Home blood sugar: not checking  Lab Results  Component Value Date   HGBA1C 6.3 (H) 10/28/2018   There were no vitals filed for this visit.  Lipid Panel  No results found for: CHOL, TRIG, HDL, CHOLHDL, VLDL, LDLCALC, LDLDIRECT  Clinical ASCVD: No  The ASCVD Risk score Mikey Bussing DC Jr., et al., 2013) failed to calculate for the following reasons:   The 2013 ASCVD risk score is only valid for ages 43 to 61    A/P: Pt w/ prediabetes with A1c 10/28/18. Patient is able to verbalize appropriate hypoglycemia management plan. Patient is adherent with metformin.  -Continue metformin; counseling provided -Extensively discussed pathophysiology of DM, recommended lifestyle interventions, dietary effects on glycemic control -Counseled on s/sx of and management of  hypoglycemia -MyPlate, 150 mins/wk of aerobic exercise emphasized -Next A1C anticipated 04/2019.   Written patient instructions provided.  Total time in face to face counseling 20 minutes.   Follow up PCP Clinic Visit in September, 2020.     Patient seen with: Jonathon Bellows PharmD Candidate [id number] [place], PharmD, [place] [phone]

## 2018-11-18 NOTE — Patient Instructions (Signed)
Thank you for coming to see me today. Please do the following:  1. Continue metformin twice a day. 2. Start checking blood sugars at home.  3. Morning goal: 80 - 130 (before eating) 4. Goal throughout the day: no higher than 180 5. Continue making the lifestyle changes we've discussed together during our visit. Diet and exercise play a significant role in improving your blood sugars.  6. Follow-up with me next month.    MyPlate from USDA  MyPlate is an outline of a general healthy diet based on the 2010 Dietary Guidelines for Americans, from the U.S. Department of Agriculture Architect(USDA). It sets guidelines for how much food you should eat from each food group based on your age, sex, and level of physical activity. What are tips for following MyPlate? To follow MyPlate recommendations:  Eat a wide variety of fruits and vegetables, grains, and protein foods.  Serve smaller portions and eat less food throughout the day.  Limit portion sizes to avoid overeating.  Enjoy your food.  Get at least 150 minutes of exercise every week. This is about 30 minutes each day, 5 or more days per week. It can be difficult to have every meal look like MyPlate. Think about MyPlate as eating guidelines for an entire day, rather than each individual meal. Fruits and vegetables  Make half of your plate fruits and vegetables.  Eat many different colors of fruits and vegetables each day.  For a 2,000 calorie daily food plan, eat: ? 2 cups of vegetables every day. ? 2 cups of fruit every day.  1 cup is equal to: ? 1 cup raw or cooked vegetables. ? 1 cup raw fruit. ? 1 medium-sized orange, apple, or banana. ? 1 cup 100% fruit or vegetable juice. ? 2 cups raw leafy greens, such as lettuce, spinach, or kale. ?  cup dried fruit. Grains  One fourth of your plate should be grains.  Make at least half of the grains you eat each day whole grains.  For a 2,000 calorie daily food plan, eat 6 oz of grains  every day.  1 oz is equal to: ? 1 slice bread. ? 1 cup cereal. ?  cup cooked rice, cereal, or pasta. Protein  One fourth of your plate should be protein.  Eat a wide variety of protein foods, including meat, poultry, fish, eggs, beans, nuts, and tofu.  For a 2,000 calorie daily food plan, eat 5 oz of protein every day.  1 oz is equal to: ? 1 oz meat, poultry, or fish. ?  cup cooked beans. ? 1 egg. ?  oz nuts or seeds. ? 1 Tbsp peanut butter. Dairy  Drink fat-free or low-fat (1%) milk.  Eat or drink dairy as a side to meals.  For a 2,000 calorie daily food plan, eat or drink 3 cups of dairy every day.  1 cup is equal to: ? 1 cup milk, yogurt, cottage cheese, or soy milk (soy beverage). ? 2 oz processed cheese. ? 1 oz natural cheese. Fats, oils, salt, and sugars  Only small amounts of oils are recommended.  Avoid foods that are high in calories and low in nutritional value (empty calories), like foods high in fat or added sugars.  Choose foods that are low in salt (sodium). Choose foods that have less than 140 milligrams (mg) of sodium per serving.  Drink water instead of sugary drinks. Drink enough water each day to keep your urine pale yellow. Where to find support  Work with your health care provider or a nutrition specialist (dietitian) to develop a customized eating plan that is right for you.  Download an app (mobile application) to help you track your daily food intake. Where to find more information  Go to [email or web address].hu for more information.  Learn more and log your daily food intake according to MyPlate using USDA's SuperTracker: www.supertracker.usda.gov Summary  MyPlate is a general guideline for healthy eating from the USDA. It is based on the 2010 Dietary Guidelines for Americans.  In general, fruits and vegetables should take up  of your plate, grains should take up  of your plate, and protein should take up  of your plate. This  information is not intended to replace advice given to you by your health care provider. Make sure you discuss any questions you have with your health care provider. Document Released: 06/04/2007 Document Revised: 08/14/2016 Document Reviewed: 08/14/2016 Elsevier Interactive Patient Education  2019 Universal.   Diabetes Mellitus and Exercise Exercising regularly is important for your overall health, especially when you have diabetes (diabetes mellitus). Exercising is not only about losing weight. It has many other health benefits, such as increasing muscle strength and bone density and reducing body fat and stress. This leads to improved fitness, flexibility, and endurance, all of which result in better overall health. Exercise has additional benefits for people with diabetes, including:  Reducing appetite.  Helping to lower and control blood glucose.  Lowering blood pressure.  Helping to control amounts of fatty substances (lipids) in the blood, such as cholesterol and triglycerides.  Helping the body to respond better to insulin (improving insulin sensitivity).  Reducing how much insulin the body needs.  Decreasing the risk for heart disease by: ? Lowering cholesterol and triglyceride levels. ? Increasing the levels of good cholesterol. ? Lowering blood glucose levels. What is my activity plan? Your health care provider or certified diabetes educator can help you make a plan for the type and frequency of exercise (activity plan) that works for you. Make sure that you:  Do at least 150 minutes of moderate-intensity or vigorous-intensity exercise each week. This could be brisk walking, biking, or water aerobics. ? Do stretching and strength exercises, such as yoga or weightlifting, at least 2 times a week. ? Spread out your activity over at least 3 days of the week.  Get some form of physical activity every day. ? Do not go more than 2 days in a row without some kind of physical  activity. ? Avoid being inactive for more than 30 minutes at a time. Take frequent breaks to walk or stretch.  Choose a type of exercise or activity that you enjoy, and set realistic goals.  Start slowly, and gradually increase the intensity of your exercise over time. What do I need to know about managing my diabetes?   Check your blood glucose before and after exercising. ? If your blood glucose is 240 mg/dL (13.3 mmol/L) or higher before you exercise, check your urine for ketones. If you have ketones in your urine, do not exercise until your blood glucose returns to normal. ? If your blood glucose is 100 mg/dL (5.6 mmol/L) or lower, eat a snack containing 15-20 grams of carbohydrate. Check your blood glucose 15 minutes after the snack to make sure that your level is above 100 mg/dL (5.6 mmol/L) before you start your exercise.  Know the symptoms of low blood glucose (hypoglycemia) and how to treat it. Your risk for hypoglycemia increases during  and after exercise. Common symptoms of hypoglycemia can include: ? Hunger. ? Anxiety. ? Sweating and feeling clammy. ? Confusion. ? Dizziness or feeling light-headed. ? Increased heart rate or palpitations. ? Blurry vision. ? Tingling or numbness around the mouth, lips, or tongue. ? Tremors or shakes. ? Irritability.  Keep a rapid-acting carbohydrate snack available before, during, and after exercise to help prevent or treat hypoglycemia.  Avoid injecting insulin into areas of the body that are going to be exercised. For example, avoid injecting insulin into: ? The arms, when playing tennis. ? The legs, when jogging.  Keep records of your exercise habits. Doing this can help you and your health care provider adjust your diabetes management plan as needed. Write down: ? Food that you eat before and after you exercise. ? Blood glucose levels before and after you exercise. ? The type and amount of exercise you have done. ? When your insulin  is expected to peak, if you use insulin. Avoid exercising at times when your insulin is peaking.  When you start a new exercise or activity, work with your health care provider to make sure the activity is safe for you, and to adjust your insulin, medicines, or food intake as needed.  Drink plenty of water while you exercise to prevent dehydration or heat stroke. Drink enough fluid to keep your urine clear or pale yellow. Summary  Exercising regularly is important for your overall health, especially when you have diabetes (diabetes mellitus).  Exercising has many health benefits, such as increasing muscle strength and bone density and reducing body fat and stress.  Your health care provider or certified diabetes educator can help you make a plan for the type and frequency of exercise (activity plan) that works for you.  When you start a new exercise or activity, work with your health care provider to make sure the activity is safe for you, and to adjust your insulin, medicines, or food intake as needed. This information is not intended to replace advice given to you by your health care provider. Make sure you discuss any questions you have with your health care provider. Document Released: 08/05/2003 Document Revised: 11/23/2016 Document Reviewed: 10/25/2015 Elsevier Interactive Patient Education  2019 [employer]

## 2018-11-19 ENCOUNTER — Encounter: Payer: Self-pay | Admitting: Pharmacist

## 2018-11-19 ENCOUNTER — Telehealth: Payer: Self-pay | Admitting: Lactation Services

## 2018-11-19 NOTE — Telephone Encounter (Signed)
Called [facility] to get pt scheduled for Sonohysterogram per Dr. Roselie Awkward. [facility] is not performing HSG's at this time. Will need to call back. Sent patient a Therapist, music to let her know.

## 2018-12-23 ENCOUNTER — Other Ambulatory Visit: Payer: Self-pay | Admitting: Obstetrics & Gynecology

## 2018-12-23 DIAGNOSIS — N938 Other specified abnormal uterine and vaginal bleeding: Secondary | ICD-10-CM

## 2018-12-24 MED FILL — MEGESTROL 40 MG TABLET: 40 | 15 days supply | Qty: 60 | Fill #1

## 2019-01-02 ENCOUNTER — Other Ambulatory Visit: Payer: Self-pay

## 2019-01-02 DIAGNOSIS — N938 Other specified abnormal uterine and vaginal bleeding: Secondary | ICD-10-CM

## 2019-01-15 ENCOUNTER — Other Ambulatory Visit: Payer: Self-pay

## 2019-01-15 DIAGNOSIS — N938 Other specified abnormal uterine and vaginal bleeding: Secondary | ICD-10-CM

## 2019-01-15 MED ORDER — MEGESTROL ACETATE 40 MG PO TABS: 40.0000 mg | ORAL_TABLET | Freq: Two times a day (BID) | ORAL | 3 refills | Status: DC

## 2019-01-15 NOTE — Progress Notes (Unsigned)
Pt came to the office today because she was suppose to have an [place] scheduled a/nd needed a refill on Megace.  [place] scheduled for 01/23/19 @ 0800. Sonohysterogram scheduled for but pt informed that due to her being self pay when they call her before the appt to ask if she can put some money down and then bill her for the rest.  Per Dr. Kennon Rounds pt can have refill on her Megace until she is able to be seen by Dr. Roselie Awkward.  Called [facility] to schedule Sonohysteragram was informed that they would need to call me back to schedule because due to COVID they werent scheduling them and she wanted to make sure that it was being scheduled right.  P

## 2019-01-17 MED FILL — MEGESTROL 40 MG TABLET: 40 | 15 days supply | Qty: 60 | Fill #0

## 2019-01-20 ENCOUNTER — Other Ambulatory Visit: Payer: Self-pay | Admitting: Obstetrics & Gynecology

## 2019-01-22 ENCOUNTER — Telehealth: Payer: Self-pay | Admitting: Obstetrics & Gynecology

## 2019-01-22 NOTE — Telephone Encounter (Signed)
Spoke to patient about her appointment on 8/27 @ 10:15. Patient instructed to wear a face mask for the entire appointment and no visitors are allowed with her during the visit. Patient screened for covid symptoms and denied having any

## 2019-01-23 ENCOUNTER — Other Ambulatory Visit: Payer: Self-pay

## 2019-01-23 ENCOUNTER — Ambulatory Visit (HOSPITAL_COMMUNITY)
Admission: RE | Admit: 2019-01-23 | Discharge: 2019-01-23 | Disposition: A | Discharge status: Home / Self Care | Payer: Self-pay | Source: Ambulatory Visit | Attending: Obstetrics & Gynecology | Admitting: Obstetrics & Gynecology

## 2019-01-23 ENCOUNTER — Ambulatory Visit: Payer: Self-pay | Admitting: Obstetrics & Gynecology

## 2019-01-23 DIAGNOSIS — N938 Other specified abnormal uterine and vaginal bleeding: Secondary | ICD-10-CM

## 2019-01-23 MED FILL — LABETALOL HCL 100 MG TABS: 100 | 30 days supply | Qty: 60 | Fill #2

## 2019-01-23 MED FILL — AMLODIPINE BESYLATE 5 MG TA: 5 | 30 days supply | Qty: 30 | Fill #2

## 2019-01-30 ENCOUNTER — Ambulatory Visit
Admission: RE | Admit: 2019-01-30 | Discharge: 2019-01-30 | Disposition: A | Discharge status: Home / Self Care | Payer: Self-pay | Source: Ambulatory Visit | Attending: Obstetrics & Gynecology | Admitting: Obstetrics & Gynecology

## 2019-01-30 ENCOUNTER — Ambulatory Visit: Payer: Self-pay | Admitting: Family Medicine

## 2019-01-30 ENCOUNTER — Other Ambulatory Visit: Payer: Self-pay

## 2019-01-30 DIAGNOSIS — N938 Other specified abnormal uterine and vaginal bleeding: Secondary | ICD-10-CM

## 2019-02-06 ENCOUNTER — Other Ambulatory Visit: Payer: Self-pay

## 2019-02-21 MED FILL — MEGESTROL 40 MG TABLET: 40 | 15 days supply | Qty: 60 | Fill #1

## 2019-02-26 ENCOUNTER — Ambulatory Visit: Payer: Self-pay | Admitting: Family Medicine

## 2019-03-13 ENCOUNTER — Ambulatory Visit: Payer: Self-pay | Attending: Family Medicine | Admitting: Family Medicine

## 2019-03-13 ENCOUNTER — Other Ambulatory Visit: Payer: Self-pay

## 2019-03-13 ENCOUNTER — Encounter: Payer: Self-pay | Admitting: Family Medicine

## 2019-03-13 DIAGNOSIS — R7303 Prediabetes: Secondary | ICD-10-CM

## 2019-03-13 LAB — POCT GLYCOSYLATED HEMOGLOBIN (HGB A1C): Hemoglobin A1C: 5.7 % — AB (ref 4.0–5.6)

## 2019-03-13 NOTE — Progress Notes (Signed)
Discuss medications  Wants to have something other then metformin   Pre DM  A1c from this morning was 5.7

## 2019-03-13 NOTE — Progress Notes (Signed)
   Virtual Visit via Telephone Note  I connected with Dana Allison  on 03/13/19 at  4:10 PM EDT by telephone and verified that I am speaking with the correct person using two identifiers.   I discussed the limitations, risks, security and privacy concerns of performing an evaluation and management service by telephone and the availability of in person appointments. I also discussed with the patient that there may be a patient responsible charge related to this service. The patient expressed understanding and agreed to proceed.  Patient Location: Home Provider Location: [facility] office Others participating in call: None   History of Present Illness:       33 yo female who is seen in follow-up prediabetes. She was diagnosed with pre-diabetes 4 months ago and her Hgb A1c at that time was 6.3 and she had a repeat Hgb A1c today that was 5.7. She has been taking metformin but this causes her to have abdominal discomfort/upset stomach therefore she would like to stop this medication. She did also make changes in her diet and increased her level of activity. She has had no issues with increased thirst, blurred vision and no urinary frequency.    Past Medical History:  Diagnosis Date  . Hypertension     Past Surgical History:  Procedure Laterality Date  . NO PAST SURGERIES      Family History  Problem Relation Age of Onset  . Hypertension Mother   . Diabetes Mother   . Diabetes Father   . Hypertension Sister   . Hypertension Sister     Social History   Tobacco Use  . Smoking status: Never Smoker  . Smokeless tobacco: Never Used  Substance Use Topics  . Alcohol use: Yes    Comment: occasionally  . Drug use: No     No Known Allergies     Observations/Objective: No vital signs or physical exam conducted as visit was done via telephone  Assessment and Plan: 1. Prediabetes Patient has improved her hemoglobin A1c from 6.3 to current 5.7.  She was able to come in earlier today and have  hemoglobin A1c done.  Discussed with the patient that 5.7 still places her within the category of being prediabetic.  She does not wish to be on Metformin but is also interested in medication to help with additional weight loss.  Patient will be referred to meet with the clinical pharmacist to discuss options for treatment of prediabetes that may also help to contribute to weight loss.  Recommend repeat hemoglobin A1c in the next 3 to 4 months in follow-up of her prediabetes.  Continue low carbohydrate diet and regular exercise with goal of weight loss - HgB A1c - Amb Referral to Clinical Pharmacist  Follow Up Instructions:Return in about 4 months (around 07/14/2019) for Prediabetes-referral made to clinical pharmacy.    I discussed the assessment and treatment plan with the patient. The patient was provided an opportunity to ask questions and all were answered. The patient agreed with the plan and demonstrated an understanding of the instructions.   The patient was advised to call back or seek an in-person evaluation if the symptoms worsen or if the condition fails to improve as anticipated.  I provided 8  minutes of non-face-to-face time during this encounter.   Antony Blackbird, MD

## 2019-03-17 ENCOUNTER — Encounter: Payer: Self-pay | Admitting: Obstetrics & Gynecology

## 2019-03-17 ENCOUNTER — Ambulatory Visit: Payer: Self-pay | Attending: Family Medicine | Admitting: Pharmacist

## 2019-03-17 ENCOUNTER — Ambulatory Visit (INDEPENDENT_AMBULATORY_CARE_PROVIDER_SITE_OTHER): Payer: Self-pay | Admitting: Obstetrics and Gynecology

## 2019-03-17 ENCOUNTER — Other Ambulatory Visit: Payer: Self-pay

## 2019-03-17 VITALS — BP 149/103 | HR 113 | Wt 278.0 lb

## 2019-03-17 DIAGNOSIS — N840 Polyp of corpus uteri: Secondary | ICD-10-CM

## 2019-03-17 DIAGNOSIS — R7303 Prediabetes: Secondary | ICD-10-CM

## 2019-03-17 DIAGNOSIS — N938 Other specified abnormal uterine and vaginal bleeding: Secondary | ICD-10-CM

## 2019-03-17 HISTORY — DX: Polyp of corpus uteri: N84.0

## 2019-03-17 LAB — GLUCOSE, POCT (MANUAL RESULT ENTRY): POC Glucose: 90 mg/dL (ref 70–99)

## 2019-03-17 NOTE — Patient Instructions (Signed)
Hysteroscopy Hysteroscopy is a procedure that is used to examine the inside of a woman's womb (uterus). This may be done for various reasons, including:  To look for lumps (tumors) and other growths in the uterus.  To evaluate abnormal bleeding, fibroid tumors, polyps, scar tissue (adhesions), or cancer of the uterus.  To determine the cause of an inability to get pregnant (infertility) or repeated losses of pregnancies (miscarriages).  To find a lost IUD (intrauterine device).  To perform a procedure that permanently prevents pregnancy (sterilization). During this procedure, a thin, flexible tube with a small light and camera (hysteroscope) is used to examine the uterus. The camera sends images to a monitor in the room so that your health care provider can view the inside of your uterus. A hysteroscopy should be done right after a menstrual period to make sure that you are not pregnant. Tell a health care provider about:  Any allergies you have.  All medicines you are taking, including vitamins, herbs, eye drops, creams, and over-the-counter medicines.  Any problems you or family members have had with the use of anesthetic medicines.  Any blood disorders you have.  Any surgeries you have had.  Any medical conditions you have.  Whether you are pregnant or may be pregnant. What are the risks? Generally, this is a safe procedure. However, problems may occur, including:  Excessive bleeding.  Infection.  Damage to the uterus or other structures or organs.  Allergic reaction to medicines or fluids that are used in the procedure. What happens before the procedure? Staying hydrated Follow instructions from your health care provider about hydration, which may include:  Up to 2 hours before the procedure - you may continue to drink clear liquids, such as water, clear fruit juice, black coffee, and plain tea. Eating and drinking restrictions Follow instructions from your health care  provider about eating and drinking, which may include:  8 hours before the procedure - stop eating solid foods and drink clear liquids only  2 hours before the procedure - stop drinking clear liquids. General instructions  Ask your health care provider about: ? Changing or stopping your normal medicines. This is important if you take diabetes medicines or blood thinners. ? Taking medicines such as aspirin and ibuprofen. These medicines can thin your blood and cause bleeding. Do not take these medicines for 1 week before your procedure, or as told by your health care provider.  Do not use any products that contain nicotine or tobacco for 2 weeks before the procedure. This includes cigarettes and e-cigarettes. If you need help quitting, ask your health care provider.  Medicine may be placed in your cervix the day before the procedure. This medicine causes the cervix to have a larger opening (dilate). The larger opening makes it easier for the hysteroscope to be inserted into the uterus during the procedure.  Plan to have someone with you for the first 24-48 hours after the procedure, especially if you are given a medicine to make you fall asleep (general anesthetic).  Plan to have someone take you home from the hospital or clinic. What happens during the procedure?  To lower your risk of infection: ? Your health care team will wash or sanitize their hands. ? Your skin will be washed with soap. ? Hair may be removed from the surgical area.  An IV tube will be inserted into one of your veins.  You may be given one or more of the following: ? A medicine to help   you relax (sedative). ? A medicine that numbs the area around the cervix (local anesthetic). ? A medicine to make you fall asleep (general anesthetic).  A hysteroscope will be inserted through your vagina and into your uterus.  Air or fluid will be used to enlarge your uterus, enabling your health care provider to see your uterus  better. The amount of fluid used will be carefully checked throughout the procedure.  In some cases, tissue may be gently scraped from inside the uterus and sent to a lab for testing (biopsy). The procedure may vary among health care providers and hospitals. What happens after the procedure?  Your blood pressure, heart rate, breathing rate, and blood oxygen level will be monitored until the medicines you were given have worn off.  You may have some cramping. You may be given medicines for this.  You may have bleeding, which varies from light spotting to menstrual-like bleeding. This is normal.  If you had a biopsy done, it is your responsibility to get the results of your procedure. Ask your health care provider, or the department performing the procedure, when your results will be ready. Summary  Hysteroscopy is a procedure that is used to examine the inside of a woman's womb (uterus).  After the procedure, you may have bleeding, which varies from light spotting to menstrual-like bleeding. This is normal. You may also have cramping.  Plan to have someone take you home from the hospital or clinic. This information is not intended to replace advice given to you by your health care provider. Make sure you discuss any questions you have with your health care provider. Document Released: 08/21/2000 Document Revised: 04/27/2017 Document Reviewed: 06/13/2016 Elsevier Patient Education  2020 Elsevier Inc.  

## 2019-03-17 NOTE — Progress Notes (Signed)
    S:     No chief complaint on file.  Patient arrives in good spirits.  Presents for diabetes evaluation, education, and management. Of note, she is prediabetic with a most recent A1c of 5.7.    Family/Social History:  - FHx: DM (mother, father), HTN (mother, both sisters)  - Endorses occasional alcohol use  - Never smoker  Human resources officer affordability: self pay  Patient denies adherence with medications.  Current diabetes medications include: metformin 500 mg BID (stopped d/t diarrhea) Current hypertension medications include: amlodipine 5 mg daily, labetalol 100 mg BID Current hyperlipidemia medications include: none   Patient denies hypoglycemic events.  Patient reported dietary habits:  - Pt reports improvements in diet - Reports eliminating sugars and limiting carbs   Patient-reported exercise habits:  - Denies    Patient denies nocturia.  Patient denies neuropathy. Patient denies visual changes. Patient reports self foot exams.     O:  POCT glucose: 90  Lab Results  Component Value Date   HGBA1C 5.7 (A) 03/13/2019   There were no vitals filed for this visit.  Lipid Panel  No results found for: CHOL, TRIG, HDL, CHOLHDL, VLDL, LDLCALC, LDLDIRECT  Home CBG: reports low 100s; denies any hypoglycemia or CBGs above 200    Clinical ASCVD: No  The ASCVD Risk score Mikey Bussing DC Jr., et al., 2013) failed to calculate for the following reasons:   The 2013 ASCVD risk score is only valid for ages 71 to 72   A/P: Pt with pre-DM currently controlled. Patient is able to verbalize appropriate hypoglycemia management plan. Patient is not adherent with metformin d/t side effects. After extensive counseling, we will attempt to control with lifestyle alone. Pt does not wish to continue with metformin. -Discontinued metformin.  -Extensively discussed pathophysiology of DM, recommended lifestyle interventions, dietary effects on glycemic control -Counseled on s/sx  of and management of hypoglycemia -Next A1C anticipated 08/2018.   Written patient instructions provided.  Total time in face to face counseling 30 minutes.   Follow up PCP Clinic Visit.     Benard Halsted, PharmD, [place] [phone]

## 2019-03-17 NOTE — Patient Instructions (Signed)
Thank you for coming to see me today. Please do the following:  1. Continue checking blood sugars at home. Check every day for the next month. 2. Continue making the lifestyle changes we've discussed together during our visit. Diet and exercise play a significant role in improving your blood sugars.  3. Follow-up with me in 1 month.    Hypoglycemia or low blood sugar:   Low blood sugar can happen quickly and may become an emergency if not treated right away.   While this shouldn't happen often, it can be brought upon if you skip a meal or do not eat enough. Also, if your insulin or other diabetes medications are dosed too high, this can cause your blood sugar to go to low.   Warning signs of low blood sugar include: 1. Feeling shaky or dizzy 2. Feeling weak or tired  3. Excessive hunger 4. Feeling anxious or upset  5. Sweating even when you aren't exercising  What to do if I experience low blood sugar? 1. Check your blood sugar with your meter. If lower than 70, proceed to step 2.  2. Treat with 3-4 glucose tablets or 3 packets of regular sugar. If these aren't around, you can try hard candy. Yet another option would be to drink 4 ounces of fruit juice or 6 ounces of REGULAR soda.  3. Re-check your sugar in 15 minutes. If it is still below 70, do what you did in step 2 again. If has come back up, go ahead and eat a snack or small meal at this time.

## 2019-03-17 NOTE — Progress Notes (Signed)
Ms  Dana Allison is here for follow up form her sonohystogram for AUB. Endometrial polyps noted. Bleeding a little better with Megace.  PE AF  BP 149/103 (pt did not take BP meds) Lungs clear Heart RRR Abd soft + BS  A/P AUB        Endometrial polyps  Hysteroscopy D & C recommended and reviewed with pt. R/B/Post op care reviewed Will schedule and follow up with post op visit

## 2019-03-21 MED FILL — MEGESTROL 40 MG TABLET: 40 | 15 days supply | Qty: 60 | Fill #2

## 2019-03-25 ENCOUNTER — Encounter: Payer: Self-pay | Admitting: Family Medicine

## 2019-04-01 NOTE — Pre-Procedure Instructions (Signed)
[place], [place] - [address] [facility]. [address] [place] [address] Phone: [phone] Fax: [place], [address] [place] [address] Phone: [phone] Fax: [phone]      Your procedure is scheduled on 04-08-19  Report to [facility] Entrance "A" at 1:55 P.M., and check in at the Admitting office.  Call this number if you have problems the morning of surgery:  [phone]  Call [phone] if you have any questions prior to your surgery date Monday-Friday 8am-4pm    Remember:  Do not eat  after midnight the night before your surgery  You may drink clear liquids until 1255 PM the afternoon of your surgery.   Clear liquids allowed are: Water, Non-Citrus Juices (without pulp), Carbonated Beverages, Clear Tea, Black Coffee Only, and Gatorade   Take these medicines the morning of surgery with A SIP OF WATER: amLODipine (NORVASC) labetalol (NORMODYNE) megestrol (MEGACE)   7 days prior to surgery STOP taking any Aspirin (unless otherwise instructed by your surgeon), Aleve, Naproxen, Ibuprofen, Motrin, Advil, Goody's, BC's, all herbal medications, fish oil, and all vitamins.    The Morning of Surgery  Do not wear jewelry, make-up or nail polish.  Do not wear lotions, powders, or perfumes/colognes, or deodorant  Do not shave 48 hours prior to surgery.    Do not bring valuables to the hospital.  [facility] is not responsible for any belongings or valuables.  If you are a smoker, DO NOT Smoke 24 hours prior to surgery IF you wear a CPAP at night please bring your mask, tubing, and machine the morning of surgery   Remember that you must have someone to transport you home after your surgery, and remain with you for 24 hours if you are discharged the same day.   Contacts, glasses, hearing aids, dentures or bridgework may not be worn into  surgery.    Leave your suitcase in the car.  After surgery it may be brought to your room.  For patients admitted to the hospital, discharge time will be determined by your treatment team.  Patients discharged the day of surgery will not be allowed to drive home.    Special instructions:   [facility]- Preparing For Surgery  Before surgery, you can play an important role. Because skin is not sterile, your skin needs to be as free of germs as possible. You can reduce the number of germs on your skin by washing with CHG (chlorahexidine gluconate) Soap before surgery.  CHG is an antiseptic cleaner which kills germs and bonds with the skin to continue killing germs even after washing.    Oral Hygiene is also important to reduce your risk of infection.  Remember - BRUSH YOUR TEETH THE MORNING OF SURGERY WITH YOUR REGULAR TOOTHPASTE  Please do not use if you have an allergy to CHG or antibacterial soaps. If your skin becomes reddened/irritated stop using the CHG.  Do not shave (including legs and underarms) for at least 48 hours prior to first CHG shower. It is OK to shave your face.  Please follow these instructions carefully.   1. Shower the NIGHT BEFORE SURGERY and the MORNING OF SURGERY with CHG Soap.   2. If you chose to wash your hair, wash your hair first as usual with your normal shampoo.  3. After you shampoo, rinse your hair and body thoroughly to remove the  shampoo.  4. Use CHG as you would any other liquid soap. You can apply CHG directly to the skin and wash gently with a scrungie or a clean washcloth.   5. Apply the CHG Soap to your body ONLY FROM THE NECK DOWN.  Do not use on open wounds or open sores. Avoid contact with your eyes, ears, mouth and genitals (private parts). Wash Face and genitals (private parts)  with your normal soap.   6. Wash thoroughly, paying special attention to the area where your surgery will be performed.  7. Thoroughly rinse your body with warm  water from the neck down.  8. DO NOT shower/wash with your normal soap after using and rinsing off the CHG Soap.  9. Pat yourself dry with a CLEAN TOWEL.  10. Wear CLEAN PAJAMAS to bed the night before surgery, wear comfortable clothes the morning of surgery  11. Place CLEAN SHEETS on your bed the night of your first shower and DO NOT SLEEP WITH PETS.    Day of Surgery:  Do not apply any deodorants/lotions. Please shower the morning of surgery with the CHG soap  Please wear clean clothes to the hospital/surgery center.   Remember to brush your teeth WITH YOUR REGULAR TOOTHPASTE.   Please read over the fact sheets that you were given.

## 2019-04-02 ENCOUNTER — Other Ambulatory Visit: Payer: Self-pay

## 2019-04-02 ENCOUNTER — Encounter (HOSPITAL_COMMUNITY): Payer: Self-pay

## 2019-04-02 ENCOUNTER — Encounter (HOSPITAL_COMMUNITY)
Admission: RE | Admit: 2019-04-02 | Discharge: 2019-04-02 | Disposition: A | Discharge status: Home / Self Care | Payer: Self-pay | Source: Ambulatory Visit | Attending: Obstetrics and Gynecology | Admitting: Obstetrics and Gynecology

## 2019-04-02 DIAGNOSIS — I10 Essential (primary) hypertension: Secondary | ICD-10-CM | POA: Insufficient documentation

## 2019-04-02 DIAGNOSIS — Z01818 Encounter for other preprocedural examination: Secondary | ICD-10-CM | POA: Insufficient documentation

## 2019-04-02 HISTORY — DX: Prediabetes: R73.03

## 2019-04-02 LAB — BASIC METABOLIC PANEL
Anion gap: 9 (ref 5–15)
BUN: 6 mg/dL (ref 6–20)
CO2: 23 mmol/L (ref 22–32)
Calcium: 10 mg/dL (ref 8.9–10.3)
Chloride: 108 mmol/L (ref 98–111)
Creatinine, Ser: 0.75 mg/dL (ref 0.44–1.00)
GFR calc Af Amer: >60 mL/min (ref >60)
GFR calc non Af Amer: >60 mL/min (ref >60)
Glucose, Bld: 104 mg/dL — ABNORMAL HIGH (ref 70–99)
Potassium: 4.4 mmol/L (ref 3.5–5.1)
Sodium: 140 mmol/L (ref 135–145)

## 2019-04-02 LAB — CBC
HCT: 41.4 % (ref 36.0–46.0)
Hemoglobin: 13.8 g/dL (ref 12.0–15.0)
MCH: 30.9 pg (ref 26.0–34.0)
MCHC: 33.3 g/dL (ref 30.0–36.0)
MCV: 92.6 fL (ref 80.0–100.0)
Platelets: 296 10*3/uL (ref 150–400)
RBC: 4.47 MIL/uL (ref 3.87–5.11)
RDW: 12.3 % (ref 11.5–15.5)
WBC: 10.5 10*3/uL (ref 4.0–10.5)
nRBC: 0 % (ref 0.0–0.2)

## 2019-04-02 LAB — GLUCOSE, CAPILLARY: Glucose-Capillary: 121 mg/dL — ABNORMAL HIGH (ref 70–99)

## 2019-04-02 NOTE — Progress Notes (Signed)
PCP - Dr. Antony Blackbird Cardiologist - denies  PPM/ICD - denies Device Orders - N/A Rep Notified - N/A  Chest x-ray - N/A EKG - 04/02/2019 Stress Test - denies ECHO - denies Cardiac Cath - denies  Sleep Study - denies CPAP - N/A  Fasting Blood Sugar - per patient pre-diabetic, does not have to check sugars per MD, no longer on metformin since about 2 weeks ago, and monitors what is eaten A1C - 03/13/2019 - 5.7  Blood Thinner Instructions: N/A Aspirin Instructions: N/A  ERAS Protcol - Yes PRE-SURGERY Ensure or G2- None ordered  COVID TEST- Scheduled for 04/04/2019. Patient verbalized understanding of self-quarantine instructions, appointment time, and place.  Anesthesia review: NO  Patient denies shortness of breath, fever, cough and chest pain at PAT appointment  All instructions explained to the patient, with a verbal understanding of the material. Patient agrees to go over the instructions while at home for a better understanding. Patient also instructed to self quarantine after being tested for COVID-19. The opportunity to ask questions was provided.

## 2019-04-03 MED FILL — AMLODIPINE BESYLATE 5 MG TA: 5 | 30 days supply | Qty: 30 | Fill #3

## 2019-04-03 MED FILL — LABETALOL HCL 100 MG TABS: 100 | 30 days supply | Qty: 60 | Fill #3

## 2019-04-04 ENCOUNTER — Other Ambulatory Visit (HOSPITAL_COMMUNITY)
Admission: RE | Admit: 2019-04-04 | Discharge: 2019-04-04 | Disposition: A | Discharge status: Home / Self Care | Payer: Self-pay | Source: Ambulatory Visit | Attending: Obstetrics and Gynecology | Admitting: Obstetrics and Gynecology

## 2019-04-04 DIAGNOSIS — Z01812 Encounter for preprocedural laboratory examination: Secondary | ICD-10-CM | POA: Insufficient documentation

## 2019-04-04 DIAGNOSIS — Z20828 Contact with and (suspected) exposure to other viral communicable diseases: Secondary | ICD-10-CM | POA: Insufficient documentation

## 2019-04-05 LAB — NOVEL CORONAVIRUS, NAA (HOSP ORDER, SEND-OUT TO REF LAB; TAT 18-24 HRS): SARS-CoV-2, NAA: NOT DETECTED

## 2019-04-08 ENCOUNTER — Other Ambulatory Visit: Payer: Self-pay

## 2019-04-08 ENCOUNTER — Ambulatory Visit (HOSPITAL_COMMUNITY): Payer: Self-pay

## 2019-04-08 ENCOUNTER — Ambulatory Visit (HOSPITAL_COMMUNITY)
Admission: RE | Admit: 2019-04-08 | Discharge: 2019-04-08 | Disposition: A | Discharge status: Home / Self Care | Payer: Self-pay | Attending: Obstetrics and Gynecology | Admitting: Obstetrics and Gynecology

## 2019-04-08 ENCOUNTER — Encounter (HOSPITAL_COMMUNITY)
Admission: RE | Disposition: A | Discharge status: Home / Self Care | Payer: Self-pay | Source: Home / Self Care | Attending: Obstetrics and Gynecology

## 2019-04-08 ENCOUNTER — Encounter (HOSPITAL_COMMUNITY): Payer: Self-pay | Admitting: Certified Registered Nurse Anesthetist

## 2019-04-08 ENCOUNTER — Other Ambulatory Visit: Payer: Self-pay | Admitting: Family Medicine

## 2019-04-08 DIAGNOSIS — I10 Essential (primary) hypertension: Secondary | ICD-10-CM | POA: Insufficient documentation

## 2019-04-08 DIAGNOSIS — Z8249 Family history of ischemic heart disease and other diseases of the circulatory system: Secondary | ICD-10-CM | POA: Insufficient documentation

## 2019-04-08 DIAGNOSIS — N939 Abnormal uterine and vaginal bleeding, unspecified: Secondary | ICD-10-CM | POA: Insufficient documentation

## 2019-04-08 DIAGNOSIS — N840 Polyp of corpus uteri: Secondary | ICD-10-CM | POA: Insufficient documentation

## 2019-04-08 HISTORY — PX: HYSTEROSCOPY WITH D & C: SHX1775

## 2019-04-08 LAB — POCT PREGNANCY, URINE: Preg Test, Ur: NEGATIVE

## 2019-04-08 SURGERY — DILATATION AND CURETTAGE /HYSTEROSCOPY
Anesthesia: General | Site: Vagina

## 2019-04-08 MED ORDER — MIDAZOLAM HCL 5 MG/5ML IJ SOLN
INTRAMUSCULAR | Status: DC | PRN
  Administered 2019-04-08: 2 mg via INTRAVENOUS

## 2019-04-08 MED ORDER — FENTANYL CITRATE (PF) 100 MCG/2ML IJ SOLN
INTRAMUSCULAR | Status: DC | PRN
  Administered 2019-04-08 (×2): 50 ug via INTRAVENOUS
  Administered 2019-04-08: 100 ug via INTRAVENOUS

## 2019-04-08 MED ORDER — KETOROLAC TROMETHAMINE 15 MG/ML IJ SOLN
15.0000 mg | INTRAMUSCULAR | Status: AC
  Administered 2019-04-08: 14:00:00 15 mg via INTRAVENOUS

## 2019-04-08 MED ORDER — ONDANSETRON HCL 4 MG/2ML IJ SOLN
INTRAMUSCULAR | Status: DC | PRN
  Administered 2019-04-08: 4 mg via INTRAVENOUS

## 2019-04-08 MED ORDER — LIDOCAINE 2% (20 MG/ML) 5 ML SYRINGE
INTRAMUSCULAR | Status: AC
  Filled 2019-04-08: qty 10

## 2019-04-08 MED ORDER — BUPIVACAINE HCL (PF) 0.5 % IJ SOLN
INTRAMUSCULAR | Status: AC
  Filled 2019-04-08: qty 30

## 2019-04-08 MED ORDER — OXYCODONE HCL 5 MG PO TABS: 5.0000 mg | ORAL_TABLET | Freq: Once | ORAL | Status: DC | PRN

## 2019-04-08 MED ORDER — FENTANYL CITRATE (PF) 100 MCG/2ML IJ SOLN: 25.0000 ug | INTRAMUSCULAR | Status: DC | PRN

## 2019-04-08 MED ORDER — DEXAMETHASONE SODIUM PHOSPHATE 10 MG/ML IJ SOLN
INTRAMUSCULAR | Status: DC | PRN
  Administered 2019-04-08: 5 mg via INTRAVENOUS

## 2019-04-08 MED ORDER — MIDAZOLAM HCL 2 MG/2ML IJ SOLN
INTRAMUSCULAR | Status: AC
  Filled 2019-04-08: qty 2

## 2019-04-08 MED ORDER — ROCURONIUM BROMIDE 10 MG/ML (PF) SYRINGE
PREFILLED_SYRINGE | INTRAVENOUS | Status: AC
  Filled 2019-04-08: qty 10

## 2019-04-08 MED ORDER — ACETAMINOPHEN 10 MG/ML IV SOLN: 1000.0000 mg | Freq: Once | INTRAVENOUS | Status: DC | PRN

## 2019-04-08 MED ORDER — SOD CITRATE-CITRIC ACID 500-334 MG/5ML PO SOLN
ORAL | Status: AC
  Filled 2019-04-08: qty 15

## 2019-04-08 MED ORDER — SODIUM CHLORIDE 0.9 % IR SOLN
Status: DC | PRN
  Administered 2019-04-08: 3000 mL

## 2019-04-08 MED ORDER — KETOROLAC TROMETHAMINE 15 MG/ML IJ SOLN
INTRAMUSCULAR | Status: AC
  Administered 2019-04-08: 15 mg via INTRAVENOUS
  Filled 2019-04-08: qty 1

## 2019-04-08 MED ORDER — PROPOFOL 10 MG/ML IV BOLUS
INTRAVENOUS | Status: AC
  Filled 2019-04-08: qty 20

## 2019-04-08 MED ORDER — ACETAMINOPHEN 500 MG PO TABS: 1000.0000 mg | ORAL_TABLET | Freq: Once | ORAL | Status: DC | PRN

## 2019-04-08 MED ORDER — ACETAMINOPHEN 160 MG/5ML PO SOLN: 1000.0000 mg | Freq: Once | ORAL | Status: DC | PRN

## 2019-04-08 MED ORDER — LACTATED RINGERS IV SOLN
INTRAVENOUS | Status: DC | PRN
  Administered 2019-04-08: 14:00:00 via INTRAVENOUS

## 2019-04-08 MED ORDER — LIDOCAINE 2% (20 MG/ML) 5 ML SYRINGE
INTRAMUSCULAR | Status: DC | PRN
  Administered 2019-04-08: 60 mg via INTRAVENOUS

## 2019-04-08 MED ORDER — PROPOFOL 10 MG/ML IV BOLUS
INTRAVENOUS | Status: DC | PRN
  Administered 2019-04-08: 40 mg via INTRAVENOUS
  Administered 2019-04-08: 180 mg via INTRAVENOUS

## 2019-04-08 MED ORDER — SUCCINYLCHOLINE CHLORIDE 200 MG/10ML IV SOSY
PREFILLED_SYRINGE | INTRAVENOUS | Status: AC
  Filled 2019-04-08: qty 20

## 2019-04-08 MED ORDER — FENTANYL CITRATE (PF) 250 MCG/5ML IJ SOLN
INTRAMUSCULAR | Status: AC
  Filled 2019-04-08: qty 5

## 2019-04-08 MED ORDER — HYDROCODONE-ACETAMINOPHEN 5-325 MG PO TABS: 1.0000 | ORAL_TABLET | Freq: Four times a day (QID) | ORAL | 0 refills | Status: DC | PRN

## 2019-04-08 MED ORDER — PHENYLEPHRINE 40 MCG/ML (10ML) SYRINGE FOR IV PUSH (FOR BLOOD PRESSURE SUPPORT)
PREFILLED_SYRINGE | INTRAVENOUS | Status: AC
  Filled 2019-04-08: qty 10

## 2019-04-08 MED ORDER — BUPIVACAINE HCL 0.5 % IJ SOLN
INTRAMUSCULAR | Status: DC | PRN
  Administered 2019-04-08: 7 mL

## 2019-04-08 MED ORDER — SOD CITRATE-CITRIC ACID 500-334 MG/5ML PO SOLN
ORAL | Status: AC
  Administered 2019-04-08: 30 mL via ORAL
  Filled 2019-04-08: qty 15

## 2019-04-08 MED ORDER — OXYCODONE HCL 5 MG/5ML PO SOLN: 5.0000 mg | Freq: Once | ORAL | Status: DC | PRN

## 2019-04-08 MED ORDER — ONDANSETRON HCL 4 MG/2ML IJ SOLN
INTRAMUSCULAR | Status: AC
  Filled 2019-04-08: qty 2

## 2019-04-08 MED ORDER — SUCCINYLCHOLINE CHLORIDE 20 MG/ML IJ SOLN
INTRAMUSCULAR | Status: DC | PRN
  Administered 2019-04-08: 120 mg via INTRAVENOUS

## 2019-04-08 MED ORDER — DEXAMETHASONE SODIUM PHOSPHATE 10 MG/ML IJ SOLN
INTRAMUSCULAR | Status: AC
  Filled 2019-04-08: qty 1

## 2019-04-08 MED ORDER — IBUPROFEN 800 MG PO TABS: 800.0000 mg | ORAL_TABLET | Freq: Three times a day (TID) | ORAL | 0 refills | Status: DC | PRN

## 2019-04-08 MED ORDER — SOD CITRATE-CITRIC ACID 500-334 MG/5ML PO SOLN
30.0000 mL | ORAL | Status: AC
  Administered 2019-04-08: 14:00:00 30 mL via ORAL

## 2019-04-08 MED FILL — IBUPROFEN 800 MG TABLET: 800 | 10 days supply | Qty: 30 | Fill #0

## 2019-04-08 SURGICAL SUPPLY — 14 items
CATH ROBINSON RED A/P 16FR (CATHETERS) ×3 IMPLANT
ELECT REM PT RETURN 9FT ADLT (ELECTROSURGICAL)
ELECTRODE REM PT RTRN 9FT ADLT (ELECTROSURGICAL) IMPLANT
GLOVE BIO SURGEON STRL SZ7.5 (GLOVE) ×3 IMPLANT
GLOVE BIOGEL PI IND STRL 7.0 (GLOVE) ×1 IMPLANT
GLOVE BIOGEL PI INDICATOR 7.0 (GLOVE) ×2
GOWN STRL REUS W/ TWL LRG LVL3 (GOWN DISPOSABLE) ×1 IMPLANT
GOWN STRL REUS W/ TWL XL LVL3 (GOWN DISPOSABLE) ×1 IMPLANT
GOWN STRL REUS W/TWL LRG LVL3 (GOWN DISPOSABLE) ×3
GOWN STRL REUS W/TWL XL LVL3 (GOWN DISPOSABLE) ×3
KIT PROCEDURE FLUENT (KITS) ×3 IMPLANT
PACK VAGINAL MINOR WOMEN LF (CUSTOM PROCEDURE TRAY) ×3 IMPLANT
PAD OB MATERNITY 4.3X12.25 (PERSONAL CARE ITEMS) ×3 IMPLANT
TOWEL GREEN STERILE FF (TOWEL DISPOSABLE) ×6 IMPLANT

## 2019-04-08 NOTE — Anesthesia Preprocedure Evaluation (Signed)
Anesthesia Evaluation  Patient identified by MRN, date of birth, ID band Patient awake    Reviewed: Allergy & Precautions, NPO status , Patient's Chart, lab work & pertinent test results  History of Anesthesia Complications Negative for: history of anesthetic complications  Airway Mallampati: III  TM Distance: >3 FB Neck ROM: Full    Dental  (+) Dental Advisory Given, Teeth Intact   Pulmonary neg pulmonary ROS, neg recent URI,    breath sounds clear to auscultation       Cardiovascular hypertension, Pt. on medications and Pt. on home beta blockers  Rhythm:Regular     Neuro/Psych negative neurological ROS  negative psych ROS   GI/Hepatic negative GI ROS, Neg liver ROS,   Endo/Other  Morbid obesity  Renal/GU negative Renal ROS     Musculoskeletal   Abdominal   Peds  Hematology negative hematology ROS (+)   Anesthesia Other Findings   Reproductive/Obstetrics                             Anesthesia Physical Anesthesia Plan  ASA: II  Anesthesia Plan: General   Post-op Pain Management:    Induction: Intravenous  PONV Risk Score and Plan: 3 and Ondansetron and Dexamethasone  Airway Management Planned: Oral ETT  Additional Equipment: None  Intra-op Plan:   Post-operative Plan: Extubation in OR  Informed Consent: I have reviewed the patients History and Physical, chart, labs and discussed the procedure including the risks, benefits and alternatives for the proposed anesthesia with the patient or authorized representative who has indicated his/her understanding and acceptance.     Dental advisory given  Plan Discussed with: CRNA and Surgeon  Anesthesia Plan Comments:         Anesthesia Quick Evaluation

## 2019-04-08 NOTE — H&P (Signed)
Dana Allison is an 33 y.o. female with AUB and endometrial polyp confirmed by U/S. Bleeding has improved with Megace but polyp remains.     Menstrual History: Menarche age: 68 No LMP recorded (lmp unknown). (Menstrual status: Other).    Past Medical History:  Diagnosis Date  . Hypertension   . Pre-diabetes    per patient    Past Surgical History:  Procedure Laterality Date  . NO PAST SURGERIES      Family History  Problem Relation Age of Onset  . Hypertension Mother   . Diabetes Mother   . Diabetes Father   . Hypertension Sister   . Hypertension Sister     Social History:  reports that she has never smoked. She has never used smokeless tobacco. She reports current alcohol use. She reports that she does not use drugs.  Allergies: No Known Allergies  No medications prior to admission.    Review of Systems  Constitutional: Negative.   Respiratory: Negative.   Cardiovascular: Negative.   Gastrointestinal: Negative.   Genitourinary: Negative.     There were no vitals taken for this visit. Physical Exam  Constitutional: She appears well-developed and well-nourished.  Cardiovascular: Normal rate and regular rhythm.  Respiratory: Effort normal and breath sounds normal.  GI: Soft. Bowel sounds are normal.  Genitourinary:    Genitourinary Comments: Deferred to OR     No results found for this or any previous visit (from the past 24 hour(s)).  No results found.  Assessment/Plan: AUB/Endometrial polyp  Hysteroscopy D & C recommended to pt for removal of polyp. R/B/post op care reviewed with pt. Pt verbalized understanding and agrees to proceed.     Chancy Milroy 04/08/2019, 8:34 AM

## 2019-04-08 NOTE — Op Note (Signed)
PREOPERATIVE DIAGNOSIS:  Abnormal uterine bleeding and endometrial polyp POSTOPERATIVE DIAGNOSIS: The same PROCEDURE: Hysteroscopy, Dilation and Curettage. SURGEON:  Dr.Guillermo Nehring   INDICATIONS: 33 y.o. G0P0000  here for scheduled surgery for the aforementioned diagnoses.   Risks of surgery were discussed with the patient including but not limited to: bleeding which may require transfusion; infection which may require antibiotics; injury to uterus or surrounding organs; intrauterine scarring which may impair future fertility; need for additional procedures including laparotomy or laparoscopy; and other postoperative/anesthesia complications. Written informed consent was obtained.    FINDINGS:  A 10 week size uterus.  Diffuse proliferative endometrium.  Normal ostia bilaterally.  ANESTHESIA:   General, paracervical block with 7 ml of 0.5% Marcaine INTRAVENOUS FLUIDS:  As recorded FLUID DEFICITS: 165 NS ESTIMATED BLOOD LOSS:  Less than 20 ml SPECIMENS: Endometrial curettings sent to pathology COMPLICATIONS:  None immediate.  PROCEDURE DETAILS:  The patient received intravenous antibiotics while in the preoperative area.  She was then taken to the operating room where general anesthesia was administered and was found to be adequate.  After an adequate timeout was performed, she was placed in the dorsal lithotomy position and examined; then prepped and draped in the sterile manner.   Her bladder was catheterized for an unmeasured amount of clear, yellow urine. A speculum was then placed in the patient's vagina and a single tooth tenaculum was applied to the anterior lip of the cervix.   A paracervical block using 7 ml of 0.5% Marcaine was administered.  The cervix was sounded to 10 cm and dilated manually with metal dilators to accommodate the 5 mm diagnostic hysteroscope.  Once the cervix was dilated, the hysteroscope was inserted under direct visualization using glycine as a suspension medium.  The  uterine cavity was carefully examined with the findings as noted above.   After further careful visualization of the uterine cavity, the hysteroscope was removed under direct visualization.  A sharp curettage was then performed to obtain a moderate amount of endometrial curettings.  The tenaculum was removed from the anterior lip of the cervix and the vaginal speculum was removed after noting good hemostasis.  The patient tolerated the procedure well and was taken to the recovery area awake, extubated and in stable condition.  The patient will be discharged to home as per PACU criteria.  Routine postoperative instructions given.  She was prescribed Percocet &  Ibuprofen   She will follow up in the clinic in 2-4 weeks  for postoperative evaluation.

## 2019-04-08 NOTE — Transfer of Care (Signed)
Immediate Anesthesia Transfer of Care Note  Patient: Dana Allison  Procedure(s) Performed: DILATATION AND CURETTAGE /HYSTEROSCOPY (N/A Vagina )  Patient Location: PACU  Anesthesia Type:General  Level of Consciousness: awake, alert  and oriented  Airway & Oxygen Therapy: Patient Spontanous Breathing and Patient connected to nasal cannula oxygen  Post-op Assessment: Report given to RN and Post -op Vital signs reviewed and stable  Post vital signs: Reviewed and stable  Last Vitals:  Vitals Value Taken Time  BP 137/97 04/08/19 1506  Temp    Pulse 101 04/08/19 1508  Resp 18 04/08/19 1508  SpO2 96 % 04/08/19 1508  Vitals shown include unvalidated device data.  Last Pain:  Vitals:   04/08/19 1338  TempSrc:   PainSc: 0-No pain         Complications: No apparent anesthesia complications

## 2019-04-08 NOTE — Anesthesia Procedure Notes (Signed)
Procedure Name: Intubation Date/Time: 04/08/2019 2:29 PM Performed by: Inda Coke, CRNA Pre-anesthesia Checklist: Patient identified, Emergency Drugs available, Suction available and Patient being monitored Patient Re-evaluated:Patient Re-evaluated prior to induction Oxygen Delivery Method: Circle System Utilized Preoxygenation: Pre-oxygenation with 100% oxygen Induction Type: IV induction Ventilation: Mask ventilation without difficulty Laryngoscope Size: Mac and 4 Grade View: Grade I Tube type: Oral Tube size: 7.0 mm Number of attempts: 1 Airway Equipment and Method: Stylet and Oral airway Placement Confirmation: ETT inserted through vocal cords under direct vision,  positive ETCO2 and breath sounds checked- equal and bilateral Secured at: 22 cm Tube secured with: Tape Dental Injury: Teeth and Oropharynx as per pre-operative assessment

## 2019-04-08 NOTE — Discharge Instructions (Signed)
Hysteroscopy, Care After °This sheet gives you information about how to care for yourself after your procedure. Your health care provider may also give you more specific instructions. If you have problems or questions, contact your health care provider. °What can I expect after the procedure? °After the procedure, it is common to have: °· Cramping. °· Bleeding. This can vary from light spotting to menstrual-like bleeding. °Follow these instructions at home: °Activity °· Rest for 1-2 days after the procedure. °· Do not douche, use tampons, or have sex for 2 weeks after the procedure, or until your health care provider approves. °· Do not drive for 24 hours after the procedure, or for as long as told by your health care provider. °· Do not drive, use heavy machinery, or drink alcohol while taking prescription pain medicines. °Medicines ° °· Take over-the-counter and prescription medicines only as told by your health care provider. °· Do not take aspirin during recovery. It can increase the risk of bleeding. °General instructions °· Do not take baths, swim, or use a hot tub until your health care provider approves. Take showers instead of baths for 2 weeks, or for as long as told by your health care provider. °· To prevent or treat constipation while you are taking prescription pain medicine, your health care provider may recommend that you: °? Drink enough fluid to keep your urine clear or pale yellow. °? Take over-the-counter or prescription medicines. °? Eat foods that are high in fiber, such as fresh fruits and vegetables, whole grains, and beans. °? Limit foods that are high in fat and processed sugars, such as fried and sweet foods. °· Keep all follow-up visits as told by your health care provider. This is important. °Contact a health care provider if: °· You feel dizzy or lightheaded. °· You feel nauseous. °· You have abnormal vaginal discharge. °· You have a rash. °· You have pain that does not get better with  medicine. °· You have chills. °Get help right away if: °· You have bleeding that is heavier than a normal menstrual period. °· You have a fever. °· You have pain or cramps that get worse. °· You develop new abdominal pain. °· You faint. °· You have pain in your shoulders. °· You have shortness of breath. °Summary °· After the procedure, you may have cramping and some vaginal bleeding. °· Do not douche, use tampons, or have sex for 2 weeks after the procedure, or until your health care provider approves. °· Do not take baths, swim, or use a hot tub until your health care provider approves. Take showers instead of baths for 2 weeks, or for as long as told by your health care provider. °· Report any unusual symptoms to your health care provider. °· Keep all follow-up visits as told by your health care provider. This is important. °This information is not intended to replace advice given to you by your health care provider. Make sure you discuss any questions you have with your health care provider. °Document Released: 03/05/2013 Document Revised: 04/27/2017 Document Reviewed: 06/13/2016 °Elsevier Patient Education © 2020 [employer] ° °

## 2019-04-08 NOTE — Progress Notes (Signed)
Called by PACU. [facility] unable to fill hydrocodone prescription. Prescription sent to [facility] at pt request.  Truett Mainland, DO 04/08/2019 4:44 PM

## 2019-04-08 NOTE — Interval H&P Note (Signed)
History and Physical Interval Note:  04/08/2019 2:08 PM  Dana Allison  has presented today for surgery, with the diagnosis of Endometrial Polyp.  The various methods of treatment have been discussed with the patient and family. After consideration of risks, benefits and other options for treatment, the patient has consented to  Procedure(s): DILATATION AND CURETTAGE /HYSTEROSCOPY (N/A) as a surgical intervention.  The patient's history has been reviewed, patient examined, no change in status, stable for surgery.  I have reviewed the patient's chart and labs.  Questions were answered to the patient's satisfaction.     Chancy Milroy

## 2019-04-09 ENCOUNTER — Encounter (HOSPITAL_COMMUNITY): Payer: Self-pay | Admitting: Obstetrics and Gynecology

## 2019-04-09 LAB — GLUCOSE, CAPILLARY: Glucose-Capillary: 95 mg/dL (ref 70–99)

## 2019-04-09 LAB — SURGICAL PATHOLOGY

## 2019-04-11 NOTE — Anesthesia Postprocedure Evaluation (Signed)
Anesthesia Post Note  Patient: Weston Settle  Procedure(s) Performed: DILATATION AND CURETTAGE /HYSTEROSCOPY WITH ENDOMETRIAL POLYPECTOMY (N/A Vagina )     Patient location during evaluation: PACU Anesthesia Type: General Level of consciousness: awake and alert Pain management: pain level controlled Vital Signs Assessment: post-procedure vital signs reviewed and stable Respiratory status: spontaneous breathing, nonlabored ventilation, respiratory function stable and patient connected to nasal cannula oxygen Cardiovascular status: blood pressure returned to baseline and stable Postop Assessment: no apparent nausea or vomiting Anesthetic complications: no    Last Vitals:  Vitals:   04/08/19 1521 04/08/19 1534  BP: (!) 148/99 (!) 144/98  Pulse: 90 84  Resp: 17 15  Temp:    SpO2: 100% 99%    Last Pain:  Vitals:   04/08/19 1534  TempSrc:   PainSc: 0-No pain                 Kayona Foor

## 2019-04-28 ENCOUNTER — Encounter: Payer: Self-pay | Admitting: Family Medicine

## 2019-04-29 ENCOUNTER — Other Ambulatory Visit: Payer: Self-pay | Admitting: Family Medicine

## 2019-04-29 DIAGNOSIS — I10 Essential (primary) hypertension: Secondary | ICD-10-CM

## 2019-04-29 DIAGNOSIS — R000 Tachycardia, unspecified: Secondary | ICD-10-CM

## 2019-04-29 MED ORDER — LABETALOL HCL 100 MG PO TABS: 100.0000 mg | ORAL_TABLET | Freq: Two times a day (BID) | ORAL | 1 refills | Status: DC

## 2019-04-29 MED ORDER — AMLODIPINE BESYLATE 5 MG PO TABS: 5.0000 mg | ORAL_TABLET | Freq: Every day | ORAL | 3 refills | Status: DC

## 2019-04-29 MED FILL — ?AMLODIPINE BESYLATE 5 MG T: 5 MG | 30 days supply | Qty: 30 | Fill #0

## 2019-04-29 NOTE — Telephone Encounter (Signed)
Please refill for patient. She is recovering from recent surgery.

## 2019-04-29 NOTE — Progress Notes (Signed)
Refill request received for patient's BP meds and RX's sent to pharmacy

## 2019-05-09 ENCOUNTER — Ambulatory Visit (INDEPENDENT_AMBULATORY_CARE_PROVIDER_SITE_OTHER): Payer: Self-pay | Admitting: Obstetrics and Gynecology

## 2019-05-09 ENCOUNTER — Other Ambulatory Visit: Payer: Self-pay

## 2019-05-09 ENCOUNTER — Encounter: Payer: Self-pay | Admitting: Obstetrics and Gynecology

## 2019-05-09 VITALS — BP 136/98 | HR 100 | Ht 68.0 in | Wt 276.9 lb

## 2019-05-09 DIAGNOSIS — N938 Other specified abnormal uterine and vaginal bleeding: Secondary | ICD-10-CM

## 2019-05-09 DIAGNOSIS — Z9889 Other specified postprocedural states: Secondary | ICD-10-CM | POA: Insufficient documentation

## 2019-05-09 NOTE — Progress Notes (Signed)
Dana Allison is here for post op from Hysteroscopy D & C for DUB and endometrial polyp. Pt reports bleeding pattern is much improved since surgery. No bowel or bladder dysfunction Pathology reviewed with pt  PE AF VSS Lungs clear Heart RRR Abd soft + BS  GU deferred  A/P Post op         DUB  Tx options reviewed with pt. Information on IUD's provided to pt. Considering OCP's as well. Advised to work on BP and glucose control. Advised against pregnancy until. Condoms at min recommended. Pt will let us now what she decides F/U PRN

## 2019-05-09 NOTE — Patient Instructions (Signed)
Health Maintenance, Female Adopting a healthy lifestyle and getting preventive care are important in promoting health and wellness. Ask your health care provider about:  The right schedule for you to have regular tests and exams.  Things you can do on your own to prevent diseases and keep yourself healthy. What should I know about diet, weight, and exercise? Eat a healthy diet   Eat a diet that includes plenty of vegetables, fruits, low-fat dairy products, and lean protein.  Do not eat a lot of foods that are high in solid fats, added sugars, or sodium. Maintain a healthy weight Body mass index (BMI) is used to identify weight problems. It estimates body fat based on height and weight. Your health care provider can help determine your BMI and help you achieve or maintain a healthy weight. Get regular exercise Get regular exercise. This is one of the most important things you can do for your health. Most adults should:  Exercise for at least 150 minutes each week. The exercise should increase your heart rate and make you sweat (moderate-intensity exercise).  Do strengthening exercises at least twice a week. This is in addition to the moderate-intensity exercise.  Spend less time sitting. Even light physical activity can be beneficial. Watch cholesterol and blood lipids Have your blood tested for lipids and cholesterol at 31 years of age, then have this test every 5 years. Have your cholesterol levels checked more often if:  Your lipid or cholesterol levels are high.  You are older than 31 years of age.  You are at high risk for heart disease. What should I know about cancer screening? Depending on your health history and family history, you may need to have cancer screening at various ages. This may include screening for:  Breast cancer.  Cervical cancer.  Colorectal cancer.  Skin cancer.  Lung cancer. What should I know about heart disease, diabetes, and high blood  pressure? Blood pressure and heart disease  High blood pressure causes heart disease and increases the risk of stroke. This is more likely to develop in people who have high blood pressure readings, are of African descent, or are overweight.  Have your blood pressure checked: ? Every 3-5 years if you are 18-39 years of age. ? Every year if you are 40 years old or older. Diabetes Have regular diabetes screenings. This checks your fasting blood sugar level. Have the screening done:  Once every three years after age 40 if you are at a normal weight and have a low risk for diabetes.  More often and at a younger age if you are overweight or have a high risk for diabetes. What should I know about preventing infection? Hepatitis B If you have a higher risk for hepatitis B, you should be screened for this virus. Talk with your health care provider to find out if you are at risk for hepatitis B infection. Hepatitis C Testing is recommended for:  Everyone born from 1945 through 1965.  Anyone with known risk factors for hepatitis C. Sexually transmitted infections (STIs)  Get screened for STIs, including gonorrhea and chlamydia, if: ? You are sexually active and are younger than 31 years of age. ? You are older than 31 years of age and your health care provider tells you that you are at risk for this type of infection. ? Your sexual activity has changed since you were last screened, and you are at increased risk for chlamydia or gonorrhea. Ask your health care provider if   you are at risk.  Ask your health care provider about whether you are at high risk for HIV. Your health care provider may recommend a prescription medicine to help prevent HIV infection. If you choose to take medicine to prevent HIV, you should first get tested for HIV. You should then be tested every 3 months for as long as you are taking the medicine. Pregnancy  If you are about to stop having your period (premenopausal) and  you may become pregnant, seek counseling before you get pregnant.  Take 400 to 800 micrograms (mcg) of folic acid every day if you become pregnant.  Ask for birth control (contraception) if you want to prevent pregnancy. Osteoporosis and menopause Osteoporosis is a disease in which the bones lose minerals and strength with aging. This can result in bone fractures. If you are 65 years old or older, or if you are at risk for osteoporosis and fractures, ask your health care provider if you should:  Be screened for bone loss.  Take a calcium or vitamin D supplement to lower your risk of fractures.  Be given hormone replacement therapy (HRT) to treat symptoms of menopause. Follow these instructions at home: Lifestyle  Do not use any products that contain nicotine or tobacco, such as cigarettes, e-cigarettes, and chewing tobacco. If you need help quitting, ask your health care provider.  Do not use street drugs.  Do not share needles.  Ask your health care provider for help if you need support or information about quitting drugs. Alcohol use  Do not drink alcohol if: ? Your health care provider tells you not to drink. ? You are pregnant, may be pregnant, or are planning to become pregnant.  If you drink alcohol: ? Limit how much you use to 0-1 drink a day. ? Limit intake if you are breastfeeding.  Be aware of how much alcohol is in your drink. In the [place] one drink equals one 12 oz bottle of beer (355 mL), one 5 oz glass of wine (148 mL), or one 1 oz glass of hard liquor (44 mL). General instructions  Schedule regular health, dental, and eye exams.  Stay current with your vaccines.  Tell your health care provider if: ? You often feel depressed. ? You have ever been abused or do not feel safe at home. Summary  Adopting a healthy lifestyle and getting preventive care are important in promoting health and wellness.  Follow your health care provider's instructions about healthy  diet, exercising, and getting tested or screened for diseases.  Follow your health care provider's instructions on monitoring your cholesterol and blood pressure. This information is not intended to replace advice given to you by your health care provider. Make sure you discuss any questions you have with your health care provider. Document Released: 11/28/2010 Document Revised: 05/08/2018 Document Reviewed: 05/08/2018 Elsevier Patient Education  2020 Elsevier Inc.  

## 2019-05-16 ENCOUNTER — Other Ambulatory Visit: Payer: Self-pay | Admitting: *Deleted

## 2019-05-16 MED ORDER — METRONIDAZOLE 500 MG PO TABS: 500.0000 mg | ORAL_TABLET | Freq: Two times a day (BID) | ORAL | 0 refills | Status: DC

## 2019-05-16 MED FILL — metroNIDAZOLE 500 MG TABS: 500 | 7 days supply | Qty: 14 | Fill #0

## 2019-06-11 ENCOUNTER — Other Ambulatory Visit: Payer: Self-pay

## 2019-06-11 DIAGNOSIS — N898 Other specified noninflammatory disorders of vagina: Secondary | ICD-10-CM

## 2019-06-11 MED ORDER — FLUCONAZOLE 150 MG PO TABS: 150.0000 mg | ORAL_TABLET | Freq: Once | ORAL | 0 refills | Status: AC

## 2019-06-11 MED FILL — LABETALOL HCL 100 MG TABS: 100 | 30 days supply | Qty: 60 | Fill #0

## 2019-06-11 MED FILL — FLUCONAZOLE 150 MG TABLET: 150 | 1 days supply | Qty: 1 | Fill #0

## 2019-06-11 MED FILL — AMLODIPINE BESYLATE 5 MG TA: 5 | 30 days supply | Qty: 30 | Fill #1

## 2019-07-28 ENCOUNTER — Telehealth: Payer: Self-pay | Admitting: Obstetrics and Gynecology

## 2019-07-28 NOTE — Telephone Encounter (Signed)
Called pt and discussed her concern. She stated that since her surgery on 04/08/19 she has been having light periods. She began a period on 2/24 and it has been very heavy - saturating a pad every hour. She also has pain and has been taking Aleve 2 tablets every 8 hours. She denies feeling weak or dizzy but does feel tired. I advised pt that she is exceeding the maximum safe dose of aleve and this can cause possible harm to her kidneys.  I advised she can take no more than 2 tablets every 12 hours. She can also take tylenol between doses of aleve. Pt stated that she still had some Megace which she was taking for heavy bleeding prior to surgery and wanted to know if she can take it. I advised that she may take it according to the dosage instructions. Once the bleeding stops, she should stop taking the Megace. I asked pt to call the office and request an urgent appt if the heavy bleeding returns or if she develops weakness or dizziness. Pt voiced understanding of all information and instructions given.

## 2019-07-28 NOTE — Telephone Encounter (Signed)
Patient stopped by the office to schedule an appointment for an IUD insert. Patient advised that I would give her a call with an appointment in April due to [place] not having anything with Dr. Alysia Penna. Patient verbalized understanding. Patient stated that she would like to speak to someone about her vaginal bleeding that she has been having. She stated that it is like water. Patient instructed that I would put in a message to the nurses and they will contact her as soon as they can. Patient verbalized understanding. Message sent to clinical cool.

## 2019-07-30 ENCOUNTER — Other Ambulatory Visit: Payer: Self-pay

## 2019-07-30 ENCOUNTER — Ambulatory Visit (INDEPENDENT_AMBULATORY_CARE_PROVIDER_SITE_OTHER): Payer: Medicaid Other

## 2019-07-30 VITALS — BP 132/102 | HR 118 | Wt 278.0 lb

## 2019-07-30 DIAGNOSIS — Z30014 Encounter for initial prescription of intrauterine contraceptive device: Secondary | ICD-10-CM

## 2019-07-30 DIAGNOSIS — Z30430 Encounter for insertion of intrauterine contraceptive device: Secondary | ICD-10-CM

## 2019-07-30 DIAGNOSIS — I10 Essential (primary) hypertension: Secondary | ICD-10-CM | POA: Diagnosis not present

## 2019-07-30 LAB — POCT PREGNANCY, URINE: Preg Test, Ur: NEGATIVE

## 2019-07-30 MED ORDER — LEVONORGESTREL 19.5 MCG/DAY IU IUD: INTRAUTERINE_SYSTEM | Freq: Once | INTRAUTERINE | Status: AC

## 2019-07-30 NOTE — Progress Notes (Signed)
    GYNECOLOGY OFFICE PROCEDURE NOTE  Dana Allison is a 35 y.o. G0P0000 here for [place] IUD insertion. She reports irregular and dysfunctional vaginal discharge. Brief discussion regarding patient's blood pressure and need to follow up with PCP for management. Informed that if BP remain elevated she could suffer from a stroke.   IUD Insertion Procedure Note IUD: Liletta  Exp: 09/2022  Lot: [id number] SN: [id number]  Patient identified, informed consent performed, consent signed.   Discussed risks of irregular bleeding, cramping, infection, malpositioning or misplacement of the IUD outside the uterus which may require further procedure such as laparoscopy. Time out was performed.  Urine pregnancy test negative.  Bimanual exam performed and uterus of normal size, non-tender, and anteverted position. Speculum placed in the vagina and cervix visualized.  Cervix and vaginal walls cleaned x 2 with betadine solution. Anterior aspect grasped with a single tooth tenaculum.  Uterus sounded to 8 cm.  Liletta IUD placed per manufacturer's recommendations.  Strings trimmed to ~3 cm. Tenaculum was removed, good hemostasis noted.  Patient tolerated procedure well.   Patient was given post-procedure instructions.  She was advised to have backup contraception for one week.  Patient was instructed to check IUD strings after menses or every 2 months in the absence of menses. Patient also instructed to follow up in 4 weeks for IUD check and call and/or report any issues prior to next visit.  Cherre Robins, CNM 07/30/2019

## 2019-07-30 NOTE — Progress Notes (Signed)
Here for iud insertion to control bleeding  Wants liletta

## 2019-07-30 NOTE — Patient Instructions (Signed)

## 2019-08-13 ENCOUNTER — Other Ambulatory Visit: Payer: Self-pay | Admitting: *Deleted

## 2019-08-13 DIAGNOSIS — N938 Other specified abnormal uterine and vaginal bleeding: Secondary | ICD-10-CM

## 2019-08-13 DIAGNOSIS — R52 Pain, unspecified: Secondary | ICD-10-CM

## 2019-08-13 MED ORDER — IBUPROFEN 800 MG PO TABS: 800.0000 mg | ORAL_TABLET | Freq: Three times a day (TID) | ORAL | 0 refills | Status: DC | PRN

## 2019-08-13 MED FILL — IBUPROFEN 800 MG TABLET: 800 | 10 days supply | Qty: 30 | Fill #0

## 2019-08-13 MED FILL — MEGESTROL 40 MG TABLET: 40 | 15 days supply | Qty: 60 | Fill #3

## 2019-08-25 MED FILL — AMLODIPINE BESYLATE 5 MG TA: 5 | 30 days supply | Qty: 30 | Fill #2

## 2019-08-25 MED FILL — ?IBUPROFEN 800 MG TABS: AMNEAL | 3 days supply | Qty: 12 | Fill #0

## 2019-08-25 MED FILL — ?AMOXICILLIN 500 MG CAPS: 500 | 7 days supply | Qty: 21 | Fill #0

## 2019-08-25 MED FILL — LABETALOL HCL 100 MG TABS: 100 | 30 days supply | Qty: 60 | Fill #1

## 2019-09-02 ENCOUNTER — Other Ambulatory Visit: Payer: Self-pay

## 2019-09-02 ENCOUNTER — Other Ambulatory Visit (HOSPITAL_COMMUNITY)
Admission: RE | Admit: 2019-09-02 | Discharge: 2019-09-02 | Disposition: A | Discharge status: Home / Self Care | Payer: Medicaid Other | Source: Ambulatory Visit

## 2019-09-02 ENCOUNTER — Ambulatory Visit (INDEPENDENT_AMBULATORY_CARE_PROVIDER_SITE_OTHER): Payer: Medicaid Other

## 2019-09-02 VITALS — BP 156/92 | HR 101 | Wt 278.8 lb

## 2019-09-02 DIAGNOSIS — R102 Pelvic and perineal pain unspecified side: Secondary | ICD-10-CM

## 2019-09-02 DIAGNOSIS — T8332XA Displacement of intrauterine contraceptive device, initial encounter: Secondary | ICD-10-CM

## 2019-09-02 DIAGNOSIS — Z01419 Encounter for gynecological examination (general) (routine) without abnormal findings: Secondary | ICD-10-CM

## 2019-09-02 DIAGNOSIS — I10 Essential (primary) hypertension: Secondary | ICD-10-CM | POA: Diagnosis not present

## 2019-09-02 DIAGNOSIS — Z124 Encounter for screening for malignant neoplasm of cervix: Secondary | ICD-10-CM | POA: Diagnosis present

## 2019-09-02 MED ORDER — IBUPROFEN 800 MG PO TABS: 800.0000 mg | ORAL_TABLET | Freq: Three times a day (TID) | ORAL | 1 refills | Status: DC | PRN

## 2019-09-02 MED FILL — ?IBUPROFEN 800 MG TABS: AMNEAL | 15 days supply | Qty: 45 | Fill #0

## 2019-09-02 NOTE — Progress Notes (Signed)
GYNECOLOGY OFFICE VISIT NOTE-WELL WOMAN EXAM  History:   Dana Allison G0P0000 here today for Well Woman exam.  She has a liletta in place and satisfaction in method as she reports improvement in bleeding.  She reports she is unsure if she is having cramping, but reports "somedays I have pain anyway."  Patient reports she has constant pelvic pain that is relieved with ibuprofen.  Patient states the pain was present prior to IUD insertion. She reports the pain is occurring 2x/week and she takes the medication immediately upon onset.    Patient reports she has had one partners in last year and declines STD testing stating "I don't have nothing." She denies any abnormal vaginal discharge, but reports she has light pink spotting daily and some red discharge, but "just spotting it's not a full period."     Patient reports that she performs breast exams twice weekly and endorses the ability to notice breast changes.  She denies a family history of breast, uteri or ovarian cancer.  However, patient reports a that her paternal grandmother died of cervical cancer.   Patient states she has a PCP, Dr. Cottie Banda, but hasn't seen her in the past year.  She reports that she continues to take her Labetalol 100mg  BID and Norvasc 5mg  daily.  Patient states that she exercises, but "not like I can." She reports that she does some walking, but not to work up a sweat.   Patient does not use tobacco, drugs, or alcohol.  She reports that "I can eat better" in regards to her nutritional intake. She endorses safety at home and denies DV/A.  Patient also reports good social support.    Past Medical History:  Diagnosis Date  . Endometrial polyp 03/17/2019  . Hypertension   . Pre-diabetes    per patient    Past Surgical History:  Procedure Laterality Date  . HYSTEROSCOPY WITH D & C N/A 04/08/2019   Procedure: DILATATION AND CURETTAGE /HYSTEROSCOPY WITH ENDOMETRIAL POLYPECTOMY;  Surgeon: 03/19/2019, MD;   Location: [facility] OR;  Service: Gynecology;  Laterality: N/A;  . NO PAST SURGERIES      The following portions of the patient's history were reviewed and updated as appropriate: allergies, current medications, past family history, past medical history, past social history, past surgical history and problem list.   Health Maintenance:  No pap history on file.  No mammogram history.   Review of Systems:  Pertinent items noted in HPI and remainder of comprehensive ROS otherwise negative.    Objective:    Physical Exam BP (!) 156/92   Pulse (!) 101   Wt 278 lb 12.8 oz (126.5 kg)   BMI 42.39 kg/m  Physical Exam Exam conducted with a chaperone present.  Constitutional:      Appearance: Normal appearance. She is obese.  HENT:     Head: Normocephalic and atraumatic.  Eyes:     Conjunctiva/sclera: Conjunctivae normal.  Neck:     Thyroid: No thyroid mass or thyroid tenderness.     Trachea: Trachea normal.  Cardiovascular:     Rate and Rhythm: Normal rate and regular rhythm.     Heart sounds: Normal heart sounds.  Pulmonary:     Effort: Pulmonary effort is normal. No respiratory distress.     Breath sounds: Normal breath sounds.  Chest:     Comments: CBE Deferred Abdominal:     General: Bowel sounds are normal.     Palpations: Abdomen is soft.  Tenderness: There is no abdominal tenderness.  Genitourinary:    Vagina: Bleeding present.     Cervix: No cervical motion tenderness, friability or erythema.     Comments: -Small amt dark red blood in vault. -Cervix visualized, but no IUD strings appreciated. -Pap smear collected with brush and spatula. -Cervical canal swiped x 2 with cytobrush in an attempt to remove IUD strings. -Uterine size not appreciated d/t body habitus.  Musculoskeletal:        General: Normal range of motion.     Cervical back: Normal range of motion.  Skin:    General: Skin is warm and dry.  Neurological:     Mental Status: She is alert.  Psychiatric:          Mood and Affect: Affect normal. Mood is depressed.        Behavior: Behavior normal. Behavior is cooperative.        Thought Content: Thought content normal.     Comments: Mood improves as appropriate progresses.       Labs and Imaging No results found for this or any previous visit (from the past 168 hour(s)). No results found.   Assessment & Plan:    1.  Encounter for well woman exam with routine gynecological exam  -Exam performed and findings discussed. -Educated on AHA exercise recommendations of 30 minutes of moderate to vigorous activity at least 5x/week. -Educated and encouraged to initiate monthly SBE with increased breast awareness including examination of breast for skin changes, moles, tenderness, etc.   2. Screening for cervical cancer -Educated on ASCCP guidelines regarding pap smear evaluation and frequency. -Informed of turnover time and provider/clinic policy on releasing results. -Informed that results would be released Mychart.  3. Chronic hypertension -Encouraged to schedule appt with PCP for management of HTN. -Reviewed bp today and instructed to continue antihypertensive meds.   4. Morbidly obese ([place]) -Offered and declines nutritional consult.  5. Cyclical pelvic pain -Encouraged to monitor symptoms. -Rx for Ibuprofen refilled. -Discussed usage of PPI if taking ibuprofen more than 3x/week.  6. Intrauterine contraceptive device threads lost, initial encounter -Informed that IUD strings not identified. -Discussed possible displacement or expulsion. -Patient states that she has not had any heavy bleeding or passing of large clots since insertion. -Patient expresses certainty with IUD remaining in place citing improvement in bleeding. -Reassured that it is okay to have anxiety about unidentified strings.   -Encouraged condom usage for sexual encounters until IUD could be identified.  -Pelvic US order placed for 1-2 weeks.    Routine preventative  health maintenance measures emphasized. Please refer to After Visit Summary for other counseling recommendations.   Return in about 2 weeks (around 09/16/2019), or for pelvic US.      Maryann Conners, CNM 09/02/2019

## 2019-09-02 NOTE — Patient Instructions (Signed)
The Journal of Orthopaedic and Sports Physical Therapy, 44(10), 748. https://doi.org/10.2519/jospt.2014.0506">  How to Increase Your Level of Physical Activity Getting regular physical activity is important for your overall health and well-being. Most people do not get enough exercise. There are easy ways to increase your level of physical activity, even if you have not been very active in the past or if you are just starting out. How can increasing my physical activity affect me? Physical activity has many short-term and long-term benefits. Being active on a regular basis can improve your physical and mental health as well as provide other benefits. Physical health benefits  Helping you lose weight or maintain a healthy weight.  Strengthening your muscles and bones.  Reducing your risk of certain long-term (chronic) diseases, including heart disease, cancer, and diabetes.  Being able to move around more easily and for longer periods of time without getting tired (increased stamina).  Improving your ability to fight off illness (enhanced immunity).  Being able to sleep better.  Helping you stay healthy as you get older, including: ? Helping you stay mobile, or capable of walking and moving around. ? Preventing accidents, such as falls. ? Increasing life expectancy. Mental health benefits  Boosting your mood and improving your self-esteem.  Lowering your chance of having mental health problems, such as depression or anxiety.  Helping you feel good about your body. Other benefits  Finding new sources of fun and enjoyment.  Meeting new people who share a common interest. What steps can I take to be more physically active? Getting started  If you have a chronic illness or have not been active for a while, check with your health care provider about how to get started. Ask your health care provider what activities are safe for you.  Start out slowly. Walking or doing some simple  chair exercises is a good place to start, especially if you have not been active before or for a long time.  Set goals that you can work toward. Ask your health care provider how much exercise is best for you. In general, most adults should: ? Do moderate-intensity exercise for at least 150 minutes each week (30 minutes on most days of the week) or vigorous exercise for at least 75 minutes each week, or a combination of these.  Moderate-intensity exercise can include walking at a quick pace, biking, yoga, water aerobics, or gardening.  Vigorous exercise involves activities that take more effort, such as jogging or running, playing sports, swimming laps, or jumping rope. ? Do strength exercises on at least 2 days each week. This can include weight lifting, body weight exercises, and resistance-band exercises.  Consider using a fitness tracker, such as a mobile phone app or a device worn like a watch, that will count the number of steps you take each day. Many people strive to reach 10,000 steps a day. Choosing activities  Try to find activities that you enjoy. You are more likely to commit to an exercise routine if it does not feel like a chore.  If you have bone or joint problems, choose low-impact exercises, like walking or swimming.  Use these tips for being successful with an exercise plan: ? Find a workout partner for accountability. ? Join a group or class, such as an aerobics class, cycling class, or sports team. ? Make family time active. Go for a walk, bike, or swim. ? Include a variety of exercises each week. Being active in your daily routines Besides your formal exercise   plans, you can find ways to do physical activity during your daily routines, such as:  Walking or biking to work or to the store.  Taking the stairs instead of the elevator.  Parking farther away from the door at work or at the store.  Planning walking meetings.  Walking around while you are on the  phone.  Where to find more information  Centers for Disease Control and Prevention: www.cdc.gov/physicalactivity  President's Council on Fitness, Sports & Nutrition: www.fitness.gov  ChooseMyPlate: www.choosemyplate.gov Contact a health care provider if:  You have headaches, muscle aches, or joint pain.  You feel dizzy or light-headed while exercising.  You faint.  You have chest pain while exercising. Summary  Exercise benefits your mind and body at any age, even if you are just starting out.  If you have a chronic illness or have not been active for a while, check with your health care provider before increasing your physical activity.  Choose activities that are safe and enjoyable for you. Ask your health care provider what activities are safe for you.  Start slowly. Tell your health care provider if you have problems as you start to increase your activity level. This information is not intended to replace advice given to you by your health care provider. Make sure you discuss any questions you have with your health care provider. Document Revised: 02/17/2019 Document Reviewed: 12/09/2018 Elsevier Patient Education  2020 Elsevier Inc.  

## 2019-09-05 LAB — CYTOLOGY - PAP
Comment: NEGATIVE
High risk HPV: POSITIVE — AB

## 2019-09-05 MED FILL — AMOXICILLIN 500 MG CAPSULE: 500 | 3 days supply | Qty: 9 | Fill #0

## 2019-09-09 ENCOUNTER — Ambulatory Visit (HOSPITAL_COMMUNITY)
Admission: RE | Admit: 2019-09-09 | Discharge: 2019-09-09 | Disposition: A | Discharge status: Home / Self Care | Payer: Self-pay | Source: Ambulatory Visit

## 2019-09-09 ENCOUNTER — Other Ambulatory Visit: Payer: Self-pay

## 2019-09-09 DIAGNOSIS — T8332XA Displacement of intrauterine contraceptive device, initial encounter: Secondary | ICD-10-CM | POA: Insufficient documentation

## 2019-09-11 ENCOUNTER — Telehealth: Payer: Self-pay

## 2019-09-11 DIAGNOSIS — T8332XA Displacement of intrauterine contraceptive device, initial encounter: Secondary | ICD-10-CM

## 2019-09-11 NOTE — Telephone Encounter (Signed)
Dana Allison  July 12, 1985 [id number]  Patient called and verified her identity via birth date and last 4 of her SSN.  Patient agreeable to results via phone and was informed of pelvic US regarding IUD placement.  Discussed findings and recommendation for additional ultrasound views for definitive placement of IUD.  Reassured that IUD is in uterus, but warned that it does not appear to be in correct place.  Cautioned and instructed to continue condom usage.  Patient endorses continued pain, but states that it is no different than that experienced prior to placement.  Patient also reports increased bleeding, but that it continues to be lighter than prior to placement.  Patient instructed to continue mgmt and monitoring.  Patient questions results of pap smear and need for colposcopy.  Discussed results and recommendations.  Informed how smoking can contribute to abnormal cell growth/change.  Encouraged cessation.  Patient also reports that she is scheduled to see her PCP regarding HTN mgmt.  No other questions or concerns.  Orders placed for TVUS.   Cherre Robins MSN, CNM Advanced Practice Provider, [facility]  **This visit was completed, in its entirety, via telehealth communications.  I personally spent >/=6 minutes on the phone providing recommendations, education, and guidance.**

## 2019-09-17 ENCOUNTER — Encounter: Payer: Self-pay | Admitting: *Deleted

## 2019-09-25 NOTE — Progress Notes (Signed)
Patient ID: Dana Allison, female    DOB: March 18, 1986  MRN: [id number]  CC: High blood pressure  Subjective: Dana Allison is a 34 y.o. female  With history of dysfunctional uterine bleeding and obesity who presents for high blood pressure.  1. HIGH BLOOD PRESSURE:  Currently taking: see medication list Med Adherence: [x] Yes    [] No Medication side effects: [] Yes    [x] No Adherence with salt restriction: [] Yes    [x] No Exercise: Yes [x] No [] Home Monitoring?: [] Yes    [x] No Monitoring Frequency: [] Yes    [x] No Home BP results range: [] Yes    [x] No Smoking [] Yes [x] No SOB? [] Yes    [x] No Chest Pain?: [] Yes    [x] No Leg swelling?: [] Yes    [x] No Headaches?: [] Yes    [x] No Dizziness? [] Yes    [x] No Comments: Patient states that she is concerned because her blood pressure is always high. When asked where she has her blood pressures checked since she does not have home monitoring patient reports when she has had previous recent visits with two other providers from obstetrics and gynecology and both providers told her that her blood pressure is too high and that she should follow-up with her primary provider.  Last visit 10/14/2018 with Dr. Chapman Fitch. During that encounter patient counseled that Labetalol is a twice daily medication and that she should not take medication only once daily, as she was at that time, as this may cause rebound hypertension. Patient was also started on Amlodipine once daily to help lower blood pressure. Information was also provided for DASH diet. BMP was collected during the same visit. Today patient reports she is taking Labetalol twice daily as prescribed.   Last visit 07/29/6710 with certified nurse midwife Gavin Pound for encounter for well woman exam with routine gynecological exam. During that encounter patient's blood pressure was 156/92. Patient was instructed to continue blood pressure medications and follow-up with primary  provider.  Patient Active Problem List   Diagnosis Date Noted  . Post-operative state 05/09/2019  . DUB (dysfunctional uterine bleeding) 09/19/2018  . OBESITY, NOS 07/26/2006     Current Outpatient Medications on File Prior to Visit  Medication Sig Dispense Refill  . amLODipine (NORVASC) 5 MG tablet Take 1 tablet (5 mg total) by mouth daily. To lower blood pressure 30 tablet 3  . Blood Glucose Monitoring Suppl (TRUE METRIX METER) w/Device KIT Use to check blood sugar daily. (Patient not taking: Reported on 05/09/2019) 1 kit 0  . glucose blood (TRUE METRIX BLOOD GLUCOSE TEST) test strip Use as instructed to check blood sugar daily. (Patient not taking: Reported on 05/09/2019) 100 each 11  . ibuprofen (ADVIL) 800 MG tablet Take 1 tablet (800 mg total) by mouth every 8 (eight) hours as needed. 45 tablet 1  . labetalol (NORMODYNE) 100 MG tablet Take 1 tablet (100 mg total) by mouth 2 (two) times daily. To lower blood pressure and heart rate 60 tablet 1  . TRUEplus Lancets 28G MISC Use as instructed to check blood sugar daily. (Patient not taking: Reported on 05/09/2019) 100 each 11   No current facility-administered medications on file prior to visit.    No Known Allergies  Social History   Socioeconomic History  . Marital status: Single    Spouse name: Not on file  . Number of children: Not on file  .  Years of education: Not on file  . Highest education level: Not on file  Occupational History  . Not on file  Tobacco Use  . Smoking status: Never Smoker  . Smokeless tobacco: Never Used  Substance and Sexual Activity  . Alcohol use: Yes    Comment: occasionally  . Drug use: No  . Sexual activity: Not Currently    Birth control/protection: None  Other Topics Concern  . Not on file  Social History Narrative   ** Merged History Encounter **       Social Determinants of Health   Financial Resource Strain:   . Difficulty of Paying Living Expenses:   Food Insecurity: No Food  Insecurity  . Worried About Running Out of Food in the Last Year: Never true  . Ran Out of Food in the Last Year: Never true  Transportation Needs: No Transportation Needs  . Lack of Transportation (Medical): No  . Lack of Transportation (Non-Medical): No  Physical Activity:   . Days of Exercise per Week:   . Minutes of Exercise per Session:   Stress:   . Feeling of Stress :   Social Connections:   . Frequency of Communication with Friends and Family:   . Frequency of Social Gatherings with Friends and Family:   . Attends Religious Services:   . Active Member of Clubs or Organizations:   . Attends Club or Organization Meetings:   . Marital Status:   Intimate Partner Violence:   . Fear of Current or Ex-Partner:   . Emotionally Abused:   . Physically Abused:   . Sexually Abused:     Family History  Problem Relation Age of Onset  . Hypertension Mother   . Diabetes Mother   . Diabetes Father   . Hypertension Sister   . Hypertension Sister     Past Surgical History:  Procedure Laterality Date  . HYSTEROSCOPY WITH D & C N/A 04/08/2019   Procedure: DILATATION AND CURETTAGE /HYSTEROSCOPY WITH ENDOMETRIAL POLYPECTOMY;  Surgeon: Ervin, Michael L, MD;  Location: [facility] OR;  Service: Gynecology;  Laterality: N/A;  . NO PAST SURGERIES      ROS: Review of Systems Negative except as stated above  PHYSICAL EXAM: Vitals with BMI 09/26/2019 09/26/2019 09/02/2019  Height - - -  Weight - 276 lbs 13 oz -  BMI - - -  Systolic 130 133 156  Diastolic 93 91 92  Pulse - 91 101  SpO2- 99%, room air Temperature- 97.7 F, oral  Physical Exam General appearance - alert, well appearing, and in no distress and oriented to person, place, and time Mental status - alert, oriented to person, place, and time, normal mood, behavior, speech, dress, motor activity, and thought processes Neck - supple, no significant adenopathy Lymphatics - no palpable lymphadenopathy, no hepatosplenomegaly Chest - clear  to auscultation, no wheezes, rales or rhonchi, symmetric air entry, no tachypnea, retractions or cyanosis Heart - normal rate, regular rhythm, normal S1, S2, no murmurs, rubs, clicks or gallops Abdomen - soft, nontender, nondistended, no masses or organomegaly Neurological - alert, oriented, normal speech, no focal findings or movement disorder noted, neck supple without rigidity, cranial nerves II through XII intact, funduscopic exam normal, discs flat and sharp, DTR's normal and symmetric, motor and sensory grossly normal bilaterally, normal muscle tone, no tremors, strength 5/5, Romberg sign negative, normal gait and station  ASSESSMENT AND PLAN: 1. Essential hypertension: -Patient's blood pressure elevated during today's visit.  -Continue Labetalol as prescribed. Medication refilled. -Increase   Amlodipine from 5 mg daily to 10 mg daily. Counseled patient to quit taking medication if she experiences any side effects from medication increase and notify provider immediately and seek medical attention. Patient verbalized understanding. -Return in 2 weeks for blood pressure check with clinical pharmacist. Take blood pressure at home twice daily and record results and bring with you to blood pressure check appointment. -Counseled on blood pressure goal of less than 130/80, low-sodium, DASH diet, medication compliance, 150 minutes of moderate intensity exercise per week. Discussed medication compliance, adverse effects. -Follow-up in 3 months or sooner if needed with primary physician. - Basic Metabolic Panel - amLODipine (NORVASC) 10 MG tablet; Take 1 tablet (10 mg total) by mouth daily. To lower blood pressure  Dispense: 30 tablet; Refill: 2 - labetalol (NORMODYNE) 100 MG tablet; Take 1 tablet (100 mg total) by mouth 2 (two) times daily. To lower blood pressure and heart rate  Dispense: 60 tablet; Refill: 2 - Amb Referral to Clinical Pharmacist  2. Sinus tachycardia -Patient's heart rate 91 today in  clinic. -Continue Labetalol as prescribed for management of heart rate. -Follow-up in 3 months or sooner if needed with primary physician. - labetalol (NORMODYNE) 100 MG tablet; Take 1 tablet (100 mg total) by mouth 2 (two) times daily. To lower blood pressure and heart rate  Dispense: 60 tablet; Refill: 2  Patient was given the opportunity to ask questions.  Patient verbalized understanding of the plan and was able to repeat key elements of the plan. Patient was given clear instructions to go to Emergency Department or return to medical center if symptoms don't improve, worsen, or new problems develop.The patient verbalized understanding.   Requested Prescriptions    No prescriptions requested or ordered in this encounter    Kristi Hyer Zachery Dauer, NP

## 2019-09-26 ENCOUNTER — Encounter: Payer: Self-pay | Admitting: Family

## 2019-09-26 ENCOUNTER — Ambulatory Visit: Payer: Self-pay | Attending: Family | Admitting: Family

## 2019-09-26 ENCOUNTER — Other Ambulatory Visit: Payer: Self-pay

## 2019-09-26 DIAGNOSIS — Z6841 Body Mass Index (BMI) 40.0 and over, adult: Secondary | ICD-10-CM | POA: Insufficient documentation

## 2019-09-26 DIAGNOSIS — R000 Tachycardia, unspecified: Secondary | ICD-10-CM

## 2019-09-26 DIAGNOSIS — E669 Obesity, unspecified: Secondary | ICD-10-CM | POA: Insufficient documentation

## 2019-09-26 DIAGNOSIS — Z79899 Other long term (current) drug therapy: Secondary | ICD-10-CM | POA: Insufficient documentation

## 2019-09-26 DIAGNOSIS — I10 Essential (primary) hypertension: Secondary | ICD-10-CM | POA: Insufficient documentation

## 2019-09-26 MED ORDER — AMLODIPINE BESYLATE 10 MG PO TABS: 10.0000 mg | ORAL_TABLET | Freq: Every day | ORAL | 2 refills | Status: DC

## 2019-09-26 MED ORDER — LABETALOL HCL 100 MG PO TABS: 100.0000 mg | ORAL_TABLET | Freq: Two times a day (BID) | ORAL | 2 refills | Status: DC

## 2019-09-26 MED FILL — AMLODIPINE BESYLATE 10 MG T: 10 | 30 days supply | Qty: 30 | Fill #0

## 2019-09-26 MED FILL — LABETALOL HCL 100 MG TABS: 100 | 30 days supply | Qty: 60 | Fill #0

## 2019-09-26 NOTE — Patient Instructions (Addendum)
Continue Labetalol. Increasing Amlodipine. Lab today. Follow-up in 2 weeks with clinical pharmacist for blood pressure check. Follow-up in 3 months with primary physician or sooner if needed. Hypertension, Adult Hypertension is another name for high blood pressure. High blood pressure forces your heart to work harder to pump blood. This can cause problems over time. There are two numbers in a blood pressure reading. There is a top number (systolic) over a bottom number (diastolic). It is best to have a blood pressure that is below 120/80. Healthy choices can help lower your blood pressure, or you may need medicine to help lower it. What are the causes? The cause of this condition is not known. Some conditions may be related to high blood pressure. What increases the risk?  Smoking.  Having type 2 diabetes mellitus, high cholesterol, or both.  Not getting enough exercise or physical activity.  Being overweight.  Having too much fat, sugar, calories, or salt (sodium) in your diet.  Drinking too much alcohol.  Having long-term (chronic) kidney disease.  Having a family history of high blood pressure.  Age. Risk increases with age.  Race. You may be at higher risk if you are African American.  Gender. Men are at higher risk than women before age 61. After age 73, women are at higher risk than men.  Having obstructive sleep apnea.  Stress. What are the signs or symptoms?  High blood pressure may not cause symptoms. Very high blood pressure (hypertensive crisis) may cause: ? Headache. ? Feelings of worry or nervousness (anxiety). ? Shortness of breath. ? Nosebleed. ? A feeling of being sick to your stomach (nausea). ? Throwing up (vomiting). ? Changes in how you see. ? Very bad chest pain. ? Seizures. How is this treated?  This condition is treated by making healthy lifestyle changes, such as: ? Eating healthy foods. ? Exercising more. ? Drinking less alcohol.  Your  health care provider may prescribe medicine if lifestyle changes are not enough to get your blood pressure under control, and if: ? Your top number is above 130. ? Your bottom number is above 80.  Your personal target blood pressure may vary. Follow these instructions at home: Eating and drinking   If told, follow the DASH eating plan. To follow this plan: ? Fill one half of your plate at each meal with fruits and vegetables. ? Fill one fourth of your plate at each meal with whole grains. Whole grains include whole-wheat pasta, brown rice, and whole-grain bread. ? Eat or drink low-fat dairy products, such as skim milk or low-fat yogurt. ? Fill one fourth of your plate at each meal with low-fat (lean) proteins. Low-fat proteins include fish, chicken without skin, eggs, beans, and tofu. ? Avoid fatty meat, cured and processed meat, or chicken with skin. ? Avoid pre-made or processed food.  Eat less than 1,500 mg of salt each day.  Do not drink alcohol if: ? Your doctor tells you not to drink. ? You are pregnant, may be pregnant, or are planning to become pregnant.  If you drink alcohol: ? Limit how much you use to:  0-1 drink a day for women.  0-2 drinks a day for men. ? Be aware of how much alcohol is in your drink. In the [place] one drink equals one 12 oz bottle of beer (355 mL), one 5 oz glass of wine (148 mL), or one 1 oz glass of hard liquor (44 mL). Lifestyle   Work with your doctor to  stay at a healthy weight or to lose weight. Ask your doctor what the best weight is for you.  Get at least 30 minutes of exercise most days of the week. This may include walking, swimming, or biking.  Get at least 30 minutes of exercise that strengthens your muscles (resistance exercise) at least 3 days a week. This may include lifting weights or doing Pilates.  Do not use any products that contain nicotine or tobacco, such as cigarettes, e-cigarettes, and chewing tobacco. If you need help  quitting, ask your doctor.  Check your blood pressure at home as told by your doctor.  Keep all follow-up visits as told by your doctor. This is important. Medicines  Take over-the-counter and prescription medicines only as told by your doctor. Follow directions carefully.  Do not skip doses of blood pressure medicine. The medicine does not work as well if you skip doses. Skipping doses also puts you at risk for problems.  Ask your doctor about side effects or reactions to medicines that you should watch for. Contact a doctor if you:  Think you are having a reaction to the medicine you are taking.  Have headaches that keep coming back (recurring).  Feel dizzy.  Have swelling in your ankles.  Have trouble with your vision. Get help right away if you:  Get a very bad headache.  Start to feel mixed up (confused).  Feel weak or numb.  Feel faint.  Have very bad pain in your: ? Chest. ? Belly (abdomen).  Throw up more than once.  Have trouble breathing. Summary  Hypertension is another name for high blood pressure.  High blood pressure forces your heart to work harder to pump blood.  For most people, a normal blood pressure is less than 120/80.  Making healthy choices can help lower blood pressure. If your blood pressure does not get lower with healthy choices, you may need to take medicine. This information is not intended to replace advice given to you by your health care provider. Make sure you discuss any questions you have with your health care provider. Document Revised: 01/23/2018 Document Reviewed: 01/23/2018 Elsevier Patient Education  2020 [employer].

## 2019-09-27 LAB — BASIC METABOLIC PANEL
BUN/Creatinine Ratio: 10 (ref 9–23)
BUN: 9 mg/dL (ref 6–20)
CO2: 18 mmol/L — ABNORMAL LOW (ref 20–29)
Calcium: 9.6 mg/dL (ref 8.7–10.2)
Chloride: 106 mmol/L (ref 96–106)
Creatinine, Ser: 0.88 mg/dL (ref 0.57–1.00)
GFR calc Af Amer: 101 mL/min/{1.73_m2} (ref >59)
GFR calc non Af Amer: 88 mL/min/{1.73_m2} (ref >59)
Glucose: 102 mg/dL — ABNORMAL HIGH (ref 65–99)
Potassium: 3.6 mmol/L (ref 3.5–5.2)
Sodium: 142 mmol/L (ref 134–144)

## 2019-10-13 ENCOUNTER — Ambulatory Visit (HOSPITAL_COMMUNITY)
Admission: RE | Admit: 2019-10-13 | Discharge: 2019-10-13 | Disposition: A | Discharge status: Home / Self Care | Payer: Medicaid Other | Source: Ambulatory Visit

## 2019-10-13 ENCOUNTER — Other Ambulatory Visit: Payer: Self-pay

## 2019-10-13 DIAGNOSIS — T8332XA Displacement of intrauterine contraceptive device, initial encounter: Secondary | ICD-10-CM | POA: Insufficient documentation

## 2019-10-16 ENCOUNTER — Telehealth: Payer: Self-pay

## 2019-10-16 NOTE — Telephone Encounter (Addendum)
Dana Allison  10-01-1985 [id number]  Patient called and verified her identity via birth date and last 4 of her SSN.  Patient agreeable to results via phone and was informed of IUD placement with strings out of place. Discussed that IUD would not be removed unless issues arise and that upon that time surgical removal would be necessary.  Patient informed that periodic (every other year) ultrasounds may be necessary to confirm placement. Patient has no questions regarding IUD, but  questions what next her appt will consist of.  Informed that next appt is for colposcopy for evaluation of abnormal pap smear.  Discussed what the procedure consists of and reassured that it is not a surgery requiring her to go to sleep. Patient without further questions or concerns. Encouraged to call if questions or concerns arise prior to next appt.   **This visit was completed, in its entirety, via telehealth communications.  I personally spent >/=4 minutes on the phone providing recommendations, education, and guidance.**

## 2019-10-22 ENCOUNTER — Ambulatory Visit (INDEPENDENT_AMBULATORY_CARE_PROVIDER_SITE_OTHER): Payer: Self-pay | Admitting: Obstetrics & Gynecology

## 2019-10-22 ENCOUNTER — Other Ambulatory Visit (HOSPITAL_COMMUNITY)
Admission: RE | Admit: 2019-10-22 | Discharge: 2019-10-22 | Disposition: A | Discharge status: Home / Self Care | Payer: Medicaid Other | Source: Ambulatory Visit | Attending: Obstetrics & Gynecology | Admitting: Obstetrics & Gynecology

## 2019-10-22 ENCOUNTER — Encounter: Payer: Self-pay | Admitting: Obstetrics & Gynecology

## 2019-10-22 ENCOUNTER — Other Ambulatory Visit: Payer: Self-pay

## 2019-10-22 VITALS — BP 133/98 | HR 102 | Wt 274.3 lb

## 2019-10-22 DIAGNOSIS — Z3202 Encounter for pregnancy test, result negative: Secondary | ICD-10-CM

## 2019-10-22 DIAGNOSIS — R87612 Low grade squamous intraepithelial lesion on cytologic smear of cervix (LGSIL): Secondary | ICD-10-CM | POA: Diagnosis not present

## 2019-10-22 LAB — POCT PREGNANCY, URINE: Preg Test, Ur: NEGATIVE

## 2019-10-22 NOTE — Progress Notes (Signed)
    GYNECOLOGY CLINIC COLPOSCOPY PROCEDURE NOTE  34 y.o. G0P0000 here for colposcopy for low-grade squamous intraepithelial neoplasia (LGSIL - encompassing HPV,mild dysplasia,CIN I) pap smear on 4/21. Discussed role for HPV in cervical dysplasia, need for surveillance.  Patient given informed consent, signed copy in the chart, time out was performed.  Placed in lithotomy position. Cervix viewed with speculum and colposcope after application of acetic acid.   Colposcopy adequate? Yes  no visible lesions, no mosaicism, no punctation and no abnormal vasculature; corresponding biopsies obtained.  ECC specimen obtained. All specimens were labeled and sent to pathology.  Patient was given post procedure instructions.  Will follow up pathology and manage accordingly; patient will be contacted with results and recommendations.  Routine preventative health maintenance measures emphasized.    Raynelle Dick, MD, FACOG Attending Obstetrician & Gynecologist, [facility] for [facility], [facility]

## 2019-10-22 NOTE — Addendum Note (Signed)
Addended by: Raynelle Dick on: 10/22/2019 09:55 AM   Modules accepted: Orders

## 2019-10-23 LAB — SURGICAL PATHOLOGY

## 2019-10-29 ENCOUNTER — Telehealth: Payer: Self-pay

## 2019-10-29 NOTE — Telephone Encounter (Signed)
Called patient with colposcopy results and advised her of the doctors recommendations to follow up in a year. Patient asked about the gardasil Vaccine and I go there scheduled for her 1st dose on 6/14 @2pm 

## 2019-11-10 ENCOUNTER — Other Ambulatory Visit: Payer: Self-pay

## 2019-11-10 ENCOUNTER — Encounter: Payer: Self-pay | Admitting: *Deleted

## 2019-11-10 ENCOUNTER — Ambulatory Visit (INDEPENDENT_AMBULATORY_CARE_PROVIDER_SITE_OTHER): Payer: Medicaid Other | Admitting: *Deleted

## 2019-11-10 VITALS — BP 127/102 | HR 88 | Temp 98.2°F | Ht 68.0 in | Wt 277.0 lb

## 2019-11-10 DIAGNOSIS — Z23 Encounter for immunization: Secondary | ICD-10-CM

## 2019-11-10 NOTE — Progress Notes (Signed)
Initial Gardasil injection administered as scheduled. Pt tolerated well.  Next dose due in 2 months and 3rd dose to be administered 4 months later. Pt was advised she may send mychart message for any future questions or concerns. Noted pt has elevated diastolic BP today. She denies H/A or visual disturbances. She stated that she has not taken her BP medications in several days - has been forgetting because of a new job. Emphasis was given to pt on the importance of taking her BP meds regularly. She voiced understanding.

## 2019-11-13 MED FILL — ?AMLODIPINE BESYL 10MG TABL: 10 | 30 days supply | Qty: 30 | Fill #1

## 2019-11-13 MED FILL — ?LABETALOL HCL 100MG TABLET: 100 | 30 days supply | Qty: 60 | Fill #1

## 2019-11-21 ENCOUNTER — Telehealth: Payer: Self-pay | Admitting: Obstetrics and Gynecology

## 2019-11-21 NOTE — Telephone Encounter (Signed)
Received a call from [facility] office that patient would like to schedule an appointment, I called the patient with no answer and left a detailed message of her upcoming appointment.

## 2019-12-23 ENCOUNTER — Encounter: Payer: Self-pay | Admitting: Obstetrics and Gynecology

## 2019-12-23 ENCOUNTER — Other Ambulatory Visit (HOSPITAL_COMMUNITY)
Admission: RE | Admit: 2019-12-23 | Discharge: 2019-12-23 | Disposition: A | Discharge status: Home / Self Care | Payer: PRIVATE HEALTH INSURANCE | Source: Ambulatory Visit | Attending: Obstetrics and Gynecology | Admitting: Obstetrics and Gynecology

## 2019-12-23 ENCOUNTER — Other Ambulatory Visit: Payer: Self-pay

## 2019-12-23 ENCOUNTER — Ambulatory Visit: Payer: Medicaid Other | Admitting: Student

## 2019-12-23 ENCOUNTER — Ambulatory Visit (INDEPENDENT_AMBULATORY_CARE_PROVIDER_SITE_OTHER): Payer: PRIVATE HEALTH INSURANCE | Admitting: Obstetrics and Gynecology

## 2019-12-23 VITALS — BP 128/95 | HR 89 | Wt 275.4 lb

## 2019-12-23 DIAGNOSIS — N760 Acute vaginitis: Secondary | ICD-10-CM | POA: Insufficient documentation

## 2019-12-23 NOTE — Progress Notes (Signed)
34 yo here for evaluation of AUB. Patient s/p D&C polypectomy in 03/2019 and s/p Liletta 3/2021and reports daily spotting since IUD insertion. Patient also reports vaginal pain with intercourse. She denies any abnormal discharge or pelvic pain. Patient is sexually active with same partner. She reports onset of dyspareunia following D&C  Past Medical History:  Diagnosis Date  . Endometrial polyp 03/17/2019  . Hypertension   . Pre-diabetes    per patient   Past Surgical History:  Procedure Laterality Date  . HYSTEROSCOPY WITH D & C N/A 04/08/2019   Procedure: DILATATION AND CURETTAGE /HYSTEROSCOPY WITH ENDOMETRIAL POLYPECTOMY;  Surgeon: Hermina Staggers, MD;  Location: [facility] OR;  Service: Gynecology;  Laterality: N/A;  . NO PAST SURGERIES     Family History  Problem Relation Age of Onset  . Hypertension Mother   . Diabetes Mother   . Diabetes Father   . Hypertension Sister   . Hypertension Sister    Social History   Tobacco Use  . Smoking status: Never Smoker  . Smokeless tobacco: Never Used  Vaping Use  . Vaping Use: Never used  Substance Use Topics  . Alcohol use: Yes    Comment: occasionally  . Drug use: No   ROS See pertinent in HPI. All other systems are non contributory Blood pressure (!) 128/95, pulse 89, weight (!) 275 lb 6.4 oz (124.9 kg), last menstrual period 10/30/2019. GENERAL: Well-developed, well-nourished female in no acute distress.  ABDOMEN: Soft, nontender, nondistended. No organomegaly. PELVIC: Normal external female genitalia. Vagina is pink and rugated.  Normal discharge. Normal appearing cervix. IUD strings not visualized.  EXTREMITIES: No cyanosis, clubbing, or edema, 2+ distal pulses.  A/P 33 yo with AUB/vaginitis - reassurance provided regarding AUB given recent IUD insertion. Patient advised to return if AUB persists beyond October - vaginal swab collected to rule out infectious etiology of her pain - patient will be contacted with results - RTC  prn

## 2019-12-25 LAB — CERVICOVAGINAL ANCILLARY ONLY
Bacterial Vaginitis (gardnerella): POSITIVE — AB
Candida Glabrata: NEGATIVE
Candida Vaginitis: NEGATIVE
Chlamydia: NEGATIVE
Comment: NEGATIVE
Comment: NEGATIVE
Comment: NEGATIVE
Comment: NEGATIVE
Comment: NEGATIVE
Comment: NORMAL
Neisseria Gonorrhea: NEGATIVE
Trichomonas: NEGATIVE

## 2019-12-25 MED FILL — ?IBUPROFEN 800MG TABLETS: 800 | 15 days supply | Qty: 45 | Fill #1

## 2019-12-25 MED FILL — AMLODIPINE BESYLATE 10 MG T: 10 | 30 days supply | Qty: 30 | Fill #2

## 2019-12-25 MED FILL — ?LABETALOL HCL 100MG TABLET: 100 | 30 days supply | Qty: 60 | Fill #2

## 2019-12-26 ENCOUNTER — Ambulatory Visit: Payer: Medicaid Other | Admitting: Family Medicine

## 2019-12-26 MED ORDER — METRONIDAZOLE 500 MG PO TABS: 500.0000 mg | ORAL_TABLET | Freq: Two times a day (BID) | ORAL | 0 refills | Status: DC

## 2019-12-26 MED FILL — metroNIDAZOLE 500 MG TABS: 500 | 7 days supply | Qty: 14 | Fill #0

## 2019-12-26 NOTE — Addendum Note (Signed)
Addended by: Catalina Antigua on: 12/26/2019 08:55 AM   Modules accepted: Orders

## 2020-01-06 ENCOUNTER — Other Ambulatory Visit: Payer: Self-pay

## 2020-01-06 DIAGNOSIS — N898 Other specified noninflammatory disorders of vagina: Secondary | ICD-10-CM

## 2020-01-06 MED ORDER — FLUCONAZOLE 150 MG PO TABS: 150.0000 mg | ORAL_TABLET | Freq: Once | ORAL | 0 refills | Status: AC

## 2020-01-06 NOTE — Progress Notes (Signed)
diflucan 

## 2020-01-07 MED FILL — FLUCONAZOLE 150 MG TABLET: 150 | 1 days supply | Qty: 1 | Fill #0

## 2020-01-12 ENCOUNTER — Other Ambulatory Visit (HOSPITAL_COMMUNITY)
Admission: RE | Admit: 2020-01-12 | Discharge: 2020-01-12 | Disposition: A | Discharge status: Home / Self Care | Payer: Medicaid Other | Source: Ambulatory Visit | Attending: Obstetrics and Gynecology | Admitting: Obstetrics and Gynecology

## 2020-01-12 ENCOUNTER — Ambulatory Visit (INDEPENDENT_AMBULATORY_CARE_PROVIDER_SITE_OTHER): Payer: PRIVATE HEALTH INSURANCE | Admitting: *Deleted

## 2020-01-12 ENCOUNTER — Encounter: Payer: Self-pay | Admitting: *Deleted

## 2020-01-12 ENCOUNTER — Other Ambulatory Visit: Payer: Self-pay

## 2020-01-12 VITALS — BP 143/101 | HR 101 | Ht 68.0 in | Wt 276.5 lb

## 2020-01-12 DIAGNOSIS — Z23 Encounter for immunization: Secondary | ICD-10-CM | POA: Diagnosis not present

## 2020-01-12 DIAGNOSIS — N898 Other specified noninflammatory disorders of vagina: Secondary | ICD-10-CM | POA: Insufficient documentation

## 2020-01-12 NOTE — Progress Notes (Signed)
Gardasil #2 injection administered as scheduled. Pt tolerated well. Last dose scheduled on 05/03/20. Pt had sent a MyChart message earlier today with concerns about vaginal discharge (brown spotting). She had IUD placed on 07/30/19 and was treated for BV on 7/30. She wants to consider alternate form of birth control if the IUD is the cause of the constant discharge. I recommended that we start with self swab to determine if she has a recurrent case of BV. If she still wants to consider a change in birth control after that, she will need to schedule appointment with a provider. Pt agreed to plan. Self swab obtained and she will be notified of results and treatment if needed via MyChart. Pt was noted to have elevated BP today. She acknowledged that she has not taken her BP meds yet today. I advised her to contact her PCP for follow up. Pt voiced understanding of all information and instructions given.

## 2020-01-13 LAB — CERVICOVAGINAL ANCILLARY ONLY
Bacterial Vaginitis (gardnerella): POSITIVE — AB
Candida Glabrata: NEGATIVE
Candida Vaginitis: NEGATIVE
Chlamydia: NEGATIVE
Comment: NEGATIVE
Comment: NEGATIVE
Comment: NEGATIVE
Comment: NEGATIVE
Comment: NEGATIVE
Comment: NORMAL
Neisseria Gonorrhea: NEGATIVE
Trichomonas: NEGATIVE

## 2020-01-13 NOTE — Progress Notes (Signed)
Patient was assessed and managed by nursing staff during this encounter. I have reviewed the chart and agree with the documentation and plan. I have also made any necessary editorial changes.  Justice Aguirre A Raelyn Racette, MD 01/13/2020 7:27 AM   

## 2020-01-15 ENCOUNTER — Encounter: Payer: Self-pay | Admitting: Family Medicine

## 2020-01-15 ENCOUNTER — Telehealth: Payer: Self-pay | Admitting: *Deleted

## 2020-01-15 MED ORDER — METRONIDAZOLE 500 MG PO TABS: 500.0000 mg | ORAL_TABLET | Freq: Two times a day (BID) | ORAL | 0 refills | Status: DC

## 2020-01-15 MED FILL — metroNIDAZOLE 500 MG TABS: 500 | 7 days supply | Qty: 14 | Fill #0

## 2020-01-15 NOTE — Telephone Encounter (Addendum)
-----   Message from Warden Fillers, MD sent at 01/13/2020  4:09 PM EDT ----- Bacterial vaginosis noted on swab, will offer flagyl for treatment  500 mg po BID x 7 days, disp # 14  8/19  1405  Called pt and informed her of test results and treatment prescribed. Pt was advised to schedule appointment with a provider if the symptoms reoccur following medication treatment. She voiced understanding.

## 2020-01-16 ENCOUNTER — Other Ambulatory Visit: Payer: Self-pay | Admitting: Nurse Practitioner

## 2020-01-16 ENCOUNTER — Other Ambulatory Visit: Payer: Self-pay

## 2020-01-16 ENCOUNTER — Ambulatory Visit: Payer: PRIVATE HEALTH INSURANCE | Attending: Nurse Practitioner | Admitting: Nurse Practitioner

## 2020-01-16 ENCOUNTER — Encounter: Payer: Self-pay | Admitting: Nurse Practitioner

## 2020-01-16 VITALS — BP 140/87 | HR 82 | Temp 97.7°F | Ht 68.0 in | Wt 278.0 lb

## 2020-01-16 DIAGNOSIS — I10 Essential (primary) hypertension: Secondary | ICD-10-CM | POA: Diagnosis not present

## 2020-01-16 DIAGNOSIS — R5383 Other fatigue: Secondary | ICD-10-CM | POA: Diagnosis not present

## 2020-01-16 DIAGNOSIS — R000 Tachycardia, unspecified: Secondary | ICD-10-CM | POA: Diagnosis not present

## 2020-01-16 MED ORDER — LABETALOL HCL 100 MG PO TABS: 100.0000 mg | ORAL_TABLET | Freq: Two times a day (BID) | ORAL | 1 refills | Status: DC

## 2020-01-16 MED ORDER — AMLODIPINE BESYLATE 10 MG PO TABS: 10.0000 mg | ORAL_TABLET | Freq: Every day | ORAL | 1 refills | Status: DC

## 2020-01-16 MED FILL — AMLODIPINE BESYLATE 10 MG T: 10 | 30 days supply | Qty: 30 | Fill #0

## 2020-01-16 MED FILL — LABETALOL HCL 100MG TABLET: 100 | 30 days supply | Qty: 60 | Fill #0

## 2020-01-16 NOTE — Progress Notes (Signed)
Assessment & Plan:  Dana Allison was seen today for medication refill.  Diagnoses and all orders for this visit:  Essential hypertension -     labetalol (NORMODYNE) 100 MG tablet; Take 1 tablet (100 mg total) by mouth 2 (two) times daily. To lower blood pressure and heart rate -     amLODipine (NORVASC) 10 MG tablet; Take 1 tablet (10 mg total) by mouth daily. To lower blood pressure -     TSH -     CMP14+EGFR Continue all antihypertensives as prescribed.  Remember to bring in your blood pressure log with you for your follow up appointment.  DASH/Mediterranean Diets are healthier choices for HTN.    Sinus tachycardia -     labetalol (NORMODYNE) 100 MG tablet; Take 1 tablet (100 mg total) by mouth 2 (two) times daily. To lower blood pressure and heart rate -     TSH  Fatigue, unspecified type -     TSH -     CMP14+EGFR    Patient has been counseled on age-appropriate routine health concerns for screening and prevention. These are reviewed and up-to-date. Referrals have been placed accordingly. Immunizations are up-to-date or declined.    Subjective:   Chief Complaint  Patient presents with   Medication Refill    Pt. is here for blood pressure medication refill.    HPI Dana Allison 34 y.o. female presents to office today for HTN.  has a past medical history of Endometrial polyp (03/17/2019), Hypertension, and Pre-diabetes.   Essential Hypertension Not well controlled. She did not take amlodipine or labetalol today. She does not monitor her blood pressure at home. I have recommended that she check her blood pressures at home. Denies chest pain, shortness of breath, palpitations, lightheadedness, dizziness, headaches or BLE edema.  BP Readings from Last 3 Encounters:  01/16/20 140/87  01/12/20 (!) 143/101  12/23/19 (!) 128/95   Fatigue She endorses increased fatigue. Working 12 hours per day.  Not consistently dietary or exercise adherent. She takes a MVI daily. May need sleep  study if TSH normal. She states she has been told that she snores. BMI 42.   Review of Systems  Constitutional: Negative for fever, malaise/fatigue and weight loss.  HENT: Negative.  Negative for nosebleeds.   Eyes: Negative.  Negative for blurred vision, double vision and photophobia.  Respiratory: Negative.  Negative for cough and shortness of breath.   Cardiovascular: Negative.  Negative for chest pain, palpitations and leg swelling.  Gastrointestinal: Negative.  Negative for heartburn, nausea and vomiting.  Musculoskeletal: Negative.  Negative for myalgias.  Neurological: Negative.  Negative for dizziness, focal weakness, seizures and headaches.  Psychiatric/Behavioral: Negative.  Negative for suicidal ideas.    Past Medical History:  Diagnosis Date   Endometrial polyp 03/17/2019   Hypertension    Pre-diabetes    per patient    Past Surgical History:  Procedure Laterality Date   HYSTEROSCOPY WITH D & C N/A 04/08/2019   Procedure: DILATATION AND CURETTAGE /HYSTEROSCOPY WITH ENDOMETRIAL POLYPECTOMY;  Surgeon: Chancy Milroy, MD;  Location: [facility];  Service: Gynecology;  Laterality: N/A;   NO PAST SURGERIES      Family History  Problem Relation Age of Onset   Hypertension Mother    Diabetes Mother    Diabetes Father    Hypertension Sister    Hypertension Sister     Social History Reviewed with no changes to be made today.   Outpatient Medications Prior to Visit  Medication Sig Dispense  Refill   ibuprofen (ADVIL) 800 MG tablet Take 1 tablet (800 mg total) by mouth every 8 (eight) hours as needed. 45 tablet 1   metroNIDAZOLE (FLAGYL) 500 MG tablet Take 1 tablet (500 mg total) by mouth 2 (two) times daily. 14 tablet 0   Multiple Vitamins-Minerals (WOMENS MULTIVITAMIN PO) Take by mouth.     labetalol (NORMODYNE) 100 MG tablet Take 1 tablet (100 mg total) by mouth 2 (two) times daily. To lower blood pressure and heart rate 60 tablet 2   Blood Glucose  Monitoring Suppl (TRUE METRIX METER) w/Device KIT Use to check blood sugar daily. (Patient not taking: Reported on 05/09/2019) 1 kit 0   glucose blood (TRUE METRIX BLOOD GLUCOSE TEST) test strip Use as instructed to check blood sugar daily. (Patient not taking: Reported on 05/09/2019) 100 each 11   TRUEplus Lancets 28G MISC Use as instructed to check blood sugar daily. (Patient not taking: Reported on 05/09/2019) 100 each 11   amLODipine (NORVASC) 10 MG tablet Take 1 tablet (10 mg total) by mouth daily. To lower blood pressure 30 tablet 2   No facility-administered medications prior to visit.    No Known Allergies     Objective:    BP 140/87 (BP Location: Right Arm, Patient Position: Sitting, Cuff Size: Large)    Pulse 82    Temp 97.7 F (36.5 C) (Temporal)    Ht 5\' 8"  (1.727 m)    Wt 278 lb (126.1 kg)    LMP 10/30/2019 (LMP Unknown)    SpO2 100%    BMI 42.27 kg/m  Wt Readings from Last 3 Encounters:  01/16/20 278 lb (126.1 kg)  01/12/20 276 lb 8 oz (125.4 kg)  12/23/19 (!) 275 lb 6.4 oz (124.9 kg)    Physical Exam Vitals and nursing note reviewed.  Constitutional:      Appearance: She is well-developed.  HENT:     Head: Normocephalic and atraumatic.  Cardiovascular:     Rate and Rhythm: Normal rate and regular rhythm.     Heart sounds: Normal heart sounds. No murmur heard.  No friction rub. No gallop.   Pulmonary:     Effort: Pulmonary effort is normal. No tachypnea or respiratory distress.     Breath sounds: Normal breath sounds. No decreased breath sounds, wheezing, rhonchi or rales.  Chest:     Chest wall: No tenderness.  Abdominal:     General: Bowel sounds are normal.     Palpations: Abdomen is soft.  Musculoskeletal:        General: Normal range of motion.     Cervical back: Normal range of motion.  Skin:    General: Skin is warm and dry.  Neurological:     Mental Status: She is alert and oriented to person, place, and time.     Coordination: Coordination  normal.  Psychiatric:        Behavior: Behavior normal. Behavior is cooperative.        Thought Content: Thought content normal.        Judgment: Judgment normal.          Patient has been counseled extensively about nutrition and exercise as well as the importance of adherence with medications and regular follow-up. The patient was given clear instructions to go to ER or return to medical center if symptoms don't improve, worsen or new problems develop. The patient verbalized understanding.   Follow-up: Return in about 3 months (around 04/17/2020).   04/19/2020, FNP-BC [facility] and [place] [facility], [place]   01/16/2020, 11:56 AM

## 2020-01-17 LAB — CMP14+EGFR
ALT: 23 IU/L (ref 0–32)
AST: 20 IU/L (ref 0–40)
Albumin/Globulin Ratio: 1.4 (ref 1.2–2.2)
Albumin: 4.3 g/dL (ref 3.8–4.8)
Alkaline Phosphatase: 79 IU/L (ref 48–121)
BUN/Creatinine Ratio: 11 (ref 9–23)
BUN: 9 mg/dL (ref 6–20)
Bilirubin Total: <0.2 mg/dL (ref 0.0–1.2)
CO2: 26 mmol/L (ref 20–29)
Calcium: 9.5 mg/dL (ref 8.7–10.2)
Chloride: 101 mmol/L (ref 96–106)
Creatinine, Ser: 0.79 mg/dL (ref 0.57–1.00)
GFR calc Af Amer: 115 mL/min/{1.73_m2} (ref >59)
GFR calc non Af Amer: 100 mL/min/{1.73_m2} (ref >59)
Globulin, Total: 3.1 g/dL (ref 1.5–4.5)
Glucose: 92 mg/dL (ref 65–99)
Potassium: 3.8 mmol/L (ref 3.5–5.2)
Sodium: 140 mmol/L (ref 134–144)
Total Protein: 7.4 g/dL (ref 6.0–8.5)

## 2020-01-17 LAB — TSH: TSH: 0.798 u[IU]/mL (ref 0.450–4.500)

## 2020-01-19 ENCOUNTER — Other Ambulatory Visit: Payer: Self-pay | Admitting: Internal Medicine

## 2020-01-19 DIAGNOSIS — I10 Essential (primary) hypertension: Secondary | ICD-10-CM

## 2020-01-30 ENCOUNTER — Encounter: Payer: Self-pay | Admitting: General Practice

## 2020-02-10 ENCOUNTER — Ambulatory Visit: Payer: PRIVATE HEALTH INSURANCE | Admitting: Family

## 2020-03-15 ENCOUNTER — Encounter (HOSPITAL_COMMUNITY): Payer: Self-pay | Admitting: *Deleted

## 2020-03-15 ENCOUNTER — Other Ambulatory Visit: Payer: Self-pay

## 2020-03-15 ENCOUNTER — Ambulatory Visit (HOSPITAL_COMMUNITY)
Admission: EM | Admit: 2020-03-15 | Discharge: 2020-03-15 | Disposition: A | Discharge status: Home / Self Care | Payer: PRIVATE HEALTH INSURANCE | Attending: Urgent Care | Admitting: Urgent Care

## 2020-03-15 DIAGNOSIS — U071 COVID-19: Secondary | ICD-10-CM | POA: Diagnosis not present

## 2020-03-15 DIAGNOSIS — Z20822 Contact with and (suspected) exposure to covid-19: Secondary | ICD-10-CM | POA: Diagnosis present

## 2020-03-15 NOTE — ED Provider Notes (Signed)
[facility]    CSN: [id number] Arrival date & time: 03/15/20  0846      History   Chief Complaint Chief Complaint  Patient presents with  . Covid Exposure    HPI Dana Allison is a 34 y.o. female.   Pt is a 34 year old female that presents with body aches, loss of taste and smell.  She is been taken over-the-counter medication without much relief.  Denies any fever, chills, cough, sore throat or ear pain.  Possible Covid exposure     Past Medical History:  Diagnosis Date  . Endometrial polyp 03/17/2019  . Hypertension   . Pre-diabetes    per patient    Patient Active Problem List   Diagnosis Date Noted  . Post-operative state 05/09/2019  . DUB (dysfunctional uterine bleeding) 09/19/2018  . OBESITY, NOS 07/26/2006    Past Surgical History:  Procedure Laterality Date  . HYSTEROSCOPY WITH D & C N/A 04/08/2019   Procedure: DILATATION AND CURETTAGE /HYSTEROSCOPY WITH ENDOMETRIAL POLYPECTOMY;  Surgeon: Hermina Staggers, MD;  Location: [facility];  Service: Gynecology;  Laterality: N/A;  . NO PAST SURGERIES      OB History    Gravida  0   Para  0   Term  0   Preterm  0   AB  0   Living  0     SAB  0   TAB  0   Ectopic  0   Multiple  0   Live Births  0            Home Medications    Prior to Admission medications   Medication Sig Start Date End Date Taking? Authorizing Provider  amLODipine (NORVASC) 10 MG tablet Take 1 tablet (10 mg total) by mouth daily. To lower blood pressure 01/16/20 04/15/20 Yes Claiborne Rigg, NP  ibuprofen (ADVIL) 800 MG tablet Take 1 tablet (800 mg total) by mouth every 8 (eight) hours as needed. 09/02/19  Yes Gerrit Heck, CNM  labetalol (NORMODYNE) 100 MG tablet Take 1 tablet (100 mg total) by mouth 2 (two) times daily. To lower blood pressure and heart rate 01/16/20 04/15/20 Yes Claiborne Rigg, NP    Family History Family History  Problem Relation Age of Onset  . Hypertension Mother   . Diabetes Mother    . Diabetes Father   . Hypertension Sister   . Hypertension Sister     Social History Social History   Tobacco Use  . Smoking status: Never Smoker  . Smokeless tobacco: Never Used  Vaping Use  . Vaping Use: Never used  Substance Use Topics  . Alcohol use: Yes    Comment: occasionally  . Drug use: No     Allergies   Patient has no known allergies.   Review of Systems Review of Systems   Physical Exam Triage Vital Signs ED Triage Vitals  Enc Vitals Group     BP 03/15/20 0926 (!) 144/108     Pulse Rate 03/15/20 0926 74     Resp --      Temp 03/15/20 0926 98.9 F (37.2 C)     Temp Source 03/15/20 0926 Oral     SpO2 03/15/20 0926 100 %     Weight 03/15/20 0929 274 lb (124.3 kg)     Height 03/15/20 0929 5\' 8"  (1.727 m)     Head Circumference --      Peak Flow --      Pain Score 03/15/20  7681 4     Pain Loc --      Pain Edu? --      Excl. in GC? --    No data found.  Updated Vital Signs BP (!) 144/108 (BP Location: Left Arm)   Pulse 74   Temp 98.9 F (37.2 C) (Oral)   Ht 5\' 8"  (1.727 m)   Wt 274 lb (124.3 kg)   LMP 03/15/2020   SpO2 100%   BMI 41.66 kg/m   Visual Acuity Right Eye Distance:   Left Eye Distance:   Bilateral Distance:    Right Eye Near:   Left Eye Near:    Bilateral Near:     Physical Exam Vitals and nursing note reviewed.  Constitutional:      General: She is not in acute distress.    Appearance: Normal appearance. She is not ill-appearing, toxic-appearing or diaphoretic.  HENT:     Head: Normocephalic.     Nose: Nose normal.  Eyes:     Conjunctiva/sclera: Conjunctivae normal.  Pulmonary:     Effort: Pulmonary effort is normal.  Musculoskeletal:        General: Normal range of motion.     Cervical back: Normal range of motion.  Skin:    General: Skin is warm and dry.     Findings: No rash.  Neurological:     Mental Status: She is alert.  Psychiatric:        Mood and Affect: Mood normal.      UC Treatments /  Results  Labs (all labs ordered are listed, but only abnormal results are displayed) Labs Reviewed  SARS CORONAVIRUS 2 (TAT 6-24 HRS)    EKG   Radiology No results found.  Procedures Procedures (including critical care time)  Medications Ordered in UC Medications - No data to display  Initial Impression / Assessment and Plan / UC Course  I have reviewed the triage vital signs and the nursing notes.  Pertinent labs & imaging results that were available during my care of the patient were reviewed by me and considered in my medical decision making (see chart for details).     Covid exposure Over-the-counter medicines as needed.  Swab sent for testing. Recommend quarantine until we get results. Follow up as needed for continued or worsening symptoms  Final Clinical Impressions(s) / UC Diagnoses   Final diagnoses:  Close exposure to COVID-19 virus     Discharge Instructions     We should have the covid swab back tomorrow Rest, quarantine.  OTC medicines as needed.  Follow up as needed for continued or worsening symptoms     ED Prescriptions    None     PDMP not reviewed this encounter.   03/17/2020, NP 03/15/20 1008

## 2020-03-15 NOTE — ED Triage Notes (Signed)
Pt reports 3-4 days ago loss of taste and loss of smell. Pt also reports general body aches.

## 2020-03-15 NOTE — Discharge Instructions (Signed)
We should have the covid swab back tomorrow Rest, quarantine.  OTC medicines as needed.  Follow up as needed for continued or worsening symptoms

## 2020-03-16 LAB — SARS CORONAVIRUS 2 (TAT 6-24 HRS): SARS Coronavirus 2: POSITIVE — AB

## 2020-03-17 ENCOUNTER — Encounter: Payer: Self-pay | Admitting: Physician Assistant

## 2020-03-17 ENCOUNTER — Other Ambulatory Visit: Payer: Self-pay | Admitting: Physician Assistant

## 2020-03-17 ENCOUNTER — Ambulatory Visit (HOSPITAL_COMMUNITY)
Admission: RE | Admit: 2020-03-17 | Discharge: 2020-03-17 | Disposition: A | Discharge status: Home / Self Care | Payer: PRIVATE HEALTH INSURANCE | Source: Ambulatory Visit | Attending: Pulmonary Disease | Admitting: Pulmonary Disease

## 2020-03-17 DIAGNOSIS — U071 COVID-19: Secondary | ICD-10-CM | POA: Insufficient documentation

## 2020-03-17 DIAGNOSIS — R7303 Prediabetes: Secondary | ICD-10-CM

## 2020-03-17 DIAGNOSIS — I10 Essential (primary) hypertension: Secondary | ICD-10-CM | POA: Insufficient documentation

## 2020-03-17 MED ORDER — ALBUTEROL SULFATE HFA 108 (90 BASE) MCG/ACT IN AERS: 2.0000 | INHALATION_SPRAY | Freq: Once | RESPIRATORY_TRACT | Status: DC | PRN

## 2020-03-17 MED ORDER — SODIUM CHLORIDE 0.9 % IV SOLN: Freq: Once | INTRAVENOUS | Status: AC

## 2020-03-17 MED ORDER — METHYLPREDNISOLONE SODIUM SUCC 125 MG IJ SOLR: 125.0000 mg | Freq: Once | INTRAMUSCULAR | Status: DC | PRN

## 2020-03-17 MED ORDER — SODIUM CHLORIDE 0.9 % IV SOLN: INTRAVENOUS | Status: DC | PRN

## 2020-03-17 MED ORDER — EPINEPHRINE 0.3 MG/0.3ML IJ SOAJ: 0.3000 mg | Freq: Once | INTRAMUSCULAR | Status: DC | PRN

## 2020-03-17 MED ORDER — FAMOTIDINE IN NACL 20-0.9 MG/50ML-% IV SOLN: 20.0000 mg | Freq: Once | INTRAVENOUS | Status: DC | PRN

## 2020-03-17 MED ORDER — DIPHENHYDRAMINE HCL 50 MG/ML IJ SOLN: 50.0000 mg | Freq: Once | INTRAMUSCULAR | Status: DC | PRN

## 2020-03-17 NOTE — Discharge Instructions (Signed)

## 2020-03-17 NOTE — Progress Notes (Signed)
  Diagnosis: COVID-19  Physician: Dr Wright  Procedure: Covid Infusion Clinic Med: bamlanivimab\etesevimab infusion - Provided patient with bamlanimivab\etesevimab fact sheet for patients, parents and caregivers prior to infusion.  Complications: No immediate complications noted.  Discharge: Discharged home   Chaddrick Brue Rudd 03/17/2020  

## 2020-03-17 NOTE — Progress Notes (Signed)
I connected by phone with Dana Allison on 03/17/2020 at 9:01 AM to discuss the potential use of a new treatment for mild to moderate COVID-19 viral infection in non-hospitalized patients.  This patient is a 34 y.o. female that meets the FDA criteria for Emergency Use Authorization of COVID monoclonal antibody casirivimab/imdevimab or bamlamivimab/estevimab.  Has a (+) direct SARS-CoV-2 viral test result  Has mild or moderate COVID-19   Is NOT hospitalized due to COVID-19  Is within 10 days of symptom onset  Has at least one of the high risk factor(s) for progression to severe COVID-19 and/or hospitalization as defined in EUA.  Specific high risk criteria : BMI > 25, Diabetes and Cardiovascular disease or hypertension   I have spoken and communicated the following to the patient or parent/caregiver regarding COVID monoclonal antibody treatment:  1. FDA has authorized the emergency use for the treatment of mild to moderate COVID-19 in adults and pediatric patients with positive results of direct SARS-CoV-2 viral testing who are 68 years of age and older weighing at least 40 kg, and who are at high risk for progressing to severe COVID-19 and/or hospitalization.  2. The significant known and potential risks and benefits of COVID monoclonal antibody, and the extent to which such potential risks and benefits are unknown.  3. Information on available alternative treatments and the risks and benefits of those alternatives, including clinical trials.  4. Patients treated with COVID monoclonal antibody should continue to self-isolate and use infection control measures (e.g., wear mask, isolate, social distance, avoid sharing personal items, clean and disinfect "high touch" surfaces, and frequent handwashing) according to CDC guidelines.   5. The patient or parent/caregiver has the option to accept or refuse COVID monoclonal antibody treatment.  After reviewing this information with the patient,  the patient has agreed to receive one of the available covid 19 monoclonal antibodies and will be provided an appropriate fact sheet prior to infusion.  Sx onset 10/11. Set up for infusion on 10/20 @ 10:30am. Directions given to [facility]. Pt is aware that insurance will be charged an infusion fee. Pt is unvaccinated.   Cline Crock 03/17/2020 9:01 AM

## 2020-04-08 ENCOUNTER — Other Ambulatory Visit: Payer: Self-pay | Admitting: Obstetrics and Gynecology

## 2020-04-08 MED ORDER — NORETHINDRONE ACETATE 5 MG PO TABS
5.0000 mg | ORAL_TABLET | Freq: Every day | ORAL | 2 refills | Status: DC
Start: 2020-04-08 — End: 2020-04-08

## 2020-04-08 MED FILL — NORETHINDRONE 5 MG TABLET: 5 | 30 days supply | Qty: 30 | Fill #0

## 2020-04-16 ENCOUNTER — Emergency Department (HOSPITAL_COMMUNITY): Payer: PRIVATE HEALTH INSURANCE

## 2020-04-16 ENCOUNTER — Encounter (HOSPITAL_COMMUNITY): Payer: Self-pay

## 2020-04-16 ENCOUNTER — Other Ambulatory Visit: Payer: Self-pay

## 2020-04-16 ENCOUNTER — Encounter (HOSPITAL_COMMUNITY): Payer: Self-pay | Admitting: Emergency Medicine

## 2020-04-16 ENCOUNTER — Ambulatory Visit (INDEPENDENT_AMBULATORY_CARE_PROVIDER_SITE_OTHER)
Admission: EM | Admit: 2020-04-16 | Discharge: 2020-04-16 | Disposition: A | Discharge status: Home / Self Care | Payer: PRIVATE HEALTH INSURANCE | Source: Home / Self Care

## 2020-04-16 ENCOUNTER — Emergency Department (HOSPITAL_COMMUNITY)
Admission: EM | Admit: 2020-04-16 | Discharge: 2020-04-17 | Disposition: A | Discharge status: Home / Self Care | Payer: PRIVATE HEALTH INSURANCE | Attending: Emergency Medicine | Admitting: Emergency Medicine

## 2020-04-16 ENCOUNTER — Ambulatory Visit (INDEPENDENT_AMBULATORY_CARE_PROVIDER_SITE_OTHER): Payer: PRIVATE HEALTH INSURANCE

## 2020-04-16 DIAGNOSIS — R072 Precordial pain: Secondary | ICD-10-CM | POA: Diagnosis not present

## 2020-04-16 DIAGNOSIS — R6883 Chills (without fever): Secondary | ICD-10-CM | POA: Insufficient documentation

## 2020-04-16 DIAGNOSIS — R079 Chest pain, unspecified: Secondary | ICD-10-CM

## 2020-04-16 DIAGNOSIS — R0602 Shortness of breath: Secondary | ICD-10-CM | POA: Diagnosis not present

## 2020-04-16 DIAGNOSIS — Z79899 Other long term (current) drug therapy: Secondary | ICD-10-CM | POA: Insufficient documentation

## 2020-04-16 DIAGNOSIS — R7989 Other specified abnormal findings of blood chemistry: Secondary | ICD-10-CM | POA: Insufficient documentation

## 2020-04-16 DIAGNOSIS — I10 Essential (primary) hypertension: Secondary | ICD-10-CM | POA: Insufficient documentation

## 2020-04-16 DIAGNOSIS — R350 Frequency of micturition: Secondary | ICD-10-CM | POA: Diagnosis not present

## 2020-04-16 DIAGNOSIS — R000 Tachycardia, unspecified: Secondary | ICD-10-CM | POA: Diagnosis not present

## 2020-04-16 LAB — CBC
HCT: 39.7 % (ref 36.0–46.0)
Hemoglobin: 13.3 g/dL (ref 12.0–15.0)
MCH: 30.1 pg (ref 26.0–34.0)
MCHC: 33.5 g/dL (ref 30.0–36.0)
MCV: 89.8 fL (ref 80.0–100.0)
Platelets: 297 10*3/uL (ref 150–400)
RBC: 4.42 MIL/uL (ref 3.87–5.11)
RDW: 13.2 % (ref 11.5–15.5)
WBC: 15.7 10*3/uL — ABNORMAL HIGH (ref 4.0–10.5)
nRBC: 0 % (ref 0.0–0.2)

## 2020-04-16 LAB — BASIC METABOLIC PANEL
Anion gap: 10 (ref 5–15)
BUN: 5 mg/dL — ABNORMAL LOW (ref 6–20)
CO2: 22 mmol/L (ref 22–32)
Calcium: 9.4 mg/dL (ref 8.9–10.3)
Chloride: 106 mmol/L (ref 98–111)
Creatinine, Ser: 0.63 mg/dL (ref 0.44–1.00)
GFR, Estimated: >60 mL/min (ref >60)
Glucose, Bld: 77 mg/dL (ref 70–99)
Potassium: 3.3 mmol/L — ABNORMAL LOW (ref 3.5–5.1)
Sodium: 138 mmol/L (ref 135–145)

## 2020-04-16 LAB — URINALYSIS, ROUTINE W REFLEX MICROSCOPIC
Bacteria, UA: NONE SEEN
Bilirubin Urine: NEGATIVE
Glucose, UA: NEGATIVE mg/dL
Hgb urine dipstick: NEGATIVE
Ketones, ur: 20 mg/dL — AB
Leukocytes,Ua: NEGATIVE
Nitrite: NEGATIVE
Protein, ur: 30 mg/dL — AB
Specific Gravity, Urine: >1.046 — ABNORMAL HIGH (ref 1.005–1.030)
pH: 5 (ref 5.0–8.0)

## 2020-04-16 LAB — D-DIMER, QUANTITATIVE: D-Dimer, Quant: 0.83 ug/mL-FEU — ABNORMAL HIGH (ref 0.00–0.50)

## 2020-04-16 LAB — HEPATIC FUNCTION PANEL
ALT: 19 U/L (ref 0–44)
AST: 26 U/L (ref 15–41)
Albumin: 3.8 g/dL (ref 3.5–5.0)
Alkaline Phosphatase: 55 U/L (ref 38–126)
Bilirubin, Direct: 0.3 mg/dL — ABNORMAL HIGH (ref 0.0–0.2)
Indirect Bilirubin: 0.6 mg/dL (ref 0.3–0.9)
Total Bilirubin: 0.9 mg/dL (ref 0.3–1.2)
Total Protein: 7.7 g/dL (ref 6.5–8.1)

## 2020-04-16 LAB — LIPASE, BLOOD: Lipase: 23 U/L (ref 11–51)

## 2020-04-16 LAB — TROPONIN I (HIGH SENSITIVITY)
Troponin I (High Sensitivity): 3 ng/L (ref <18)
Troponin I (High Sensitivity): 3 ng/L (ref <18)

## 2020-04-16 LAB — LACTIC ACID, PLASMA: Lactic Acid, Venous: 1.5 mmol/L (ref 0.5–1.9)

## 2020-04-16 LAB — I-STAT BETA HCG BLOOD, ED (MC, WL, AP ONLY): I-stat hCG, quantitative: <5 m[IU]/mL (ref <5)

## 2020-04-16 MED ORDER — KETOROLAC TROMETHAMINE 30 MG/ML IJ SOLN
30.0000 mg | Freq: Once | INTRAMUSCULAR | Status: AC
  Administered 2020-04-16: 30 mg via INTRAVENOUS
  Filled 2020-04-16: qty 1

## 2020-04-16 MED ORDER — SODIUM CHLORIDE 0.9 % IV BOLUS (SEPSIS)
1000.0000 mL | Freq: Once | INTRAVENOUS | Status: AC
  Administered 2020-04-16: 1000 mL via INTRAVENOUS

## 2020-04-16 MED ORDER — LABETALOL HCL 5 MG/ML IV SOLN
5.0000 mg | Freq: Once | INTRAVENOUS | Status: DC
  Filled 2020-04-16: qty 4

## 2020-04-16 MED ORDER — AMLODIPINE BESYLATE 5 MG PO TABS
10.0000 mg | ORAL_TABLET | Freq: Once | ORAL | Status: AC
  Administered 2020-04-16: 10 mg via ORAL
  Filled 2020-04-16: qty 2

## 2020-04-16 MED ORDER — ALUM & MAG HYDROXIDE-SIMETH 200-200-20 MG/5ML PO SUSP
15.0000 mL | Freq: Once | ORAL | Status: AC
  Administered 2020-04-16: 15 mL via ORAL
  Filled 2020-04-16: qty 30

## 2020-04-16 MED ORDER — SODIUM CHLORIDE 0.9 % IV SOLN
1000.0000 mL | INTRAVENOUS | Status: DC
  Administered 2020-04-16: 1000 mL via INTRAVENOUS

## 2020-04-16 MED ORDER — IOHEXOL 350 MG/ML SOLN
100.0000 mL | Freq: Once | INTRAVENOUS | Status: AC | PRN
  Administered 2020-04-16: 100 mL via INTRAVENOUS

## 2020-04-16 NOTE — Discharge Instructions (Signed)
With your lab results and your chest pain I do recommend further evaluation in the ER now.

## 2020-04-16 NOTE — ED Notes (Signed)
Pt transported to CT ?

## 2020-04-16 NOTE — ED Notes (Signed)
Back to room at this time.

## 2020-04-16 NOTE — ED Notes (Signed)
PA peter made aware of pts blood pressure and held labetatol.

## 2020-04-16 NOTE — ED Provider Notes (Signed)
[facility] EMERGENCY DEPARTMENT Provider Note   CSN: [id number] Arrival date & time: 04/16/20  1323     History Chief Complaint  Patient presents with  . Chest Pain  . Shortness of Breath    Dana Allison is a 34 y.o. female. Patient has a history of hypertension (has not been taking labetalol and norvasc for the past 3 weeks).  Patient presents to the emergency department with a chief complaint of chest pain and shortness of breath.  Patient reports that these symptoms have been ongoing for the past 2 days.  Patient states that the chest pain is worse with deep breaths, laughing, or laying flat.  Patient reports nothing makes the chest pain better.  Patient describes the chest pain as substernal and rates it as a 7 out of 10 pain scale.  Patient denies any nausea, vomiting, diaphoresis, radiation to arm or back, unilateral leg swelling, peripheral edema, palpitations or hemoptysis.  Patient denies any previous events similar to this.  Patient endorses chills and increased frequency of urination.  She denies any recent sick contacts, fever, nasal congestion, sore throat, dysuria, abdominal pain or back pain.  Patient was seen earlier today at an urgent care for her chest pain and shortness of breath.  A D-dimer was performed and found to be elevated at 0.83.  Patient denies any previous DVT or PE, recent immobilization, recent surgery, active or history of cancer.       HPI: A 34 year old patient with a history of hypertension and obesity presents for evaluation of chest pain. Initial onset of pain was more than 6 hours ago. The patient's chest pain is not worse with exertion. The patient's chest pain is middle- or left-sided, is not well-localized, is not described as heaviness/pressure/tightness, is not sharp and does not radiate to the arms/jaw/neck. The patient does not complain of nausea and denies diaphoresis. The patient has a family history of coronary artery disease  in a first-degree relative with onset less than age 3. The patient has no history of stroke, has no history of peripheral artery disease, has not smoked in the past 90 days, denies any history of treated diabetes and has no history of hypercholesterolemia.   Past Medical History:  Diagnosis Date  . Endometrial polyp 03/17/2019  . Hypertension   . Morbid obesity (HCC)   . Pre-diabetes    per patient    Patient Active Problem List   Diagnosis Date Noted  . Morbid obesity (HCC)   . Hypertension   . Pre-diabetes   . Post-operative state 05/09/2019  . DUB (dysfunctional uterine bleeding) 09/19/2018  . OBESITY, NOS 07/26/2006    Past Surgical History:  Procedure Laterality Date  . HYSTEROSCOPY WITH D & C N/A 04/08/2019   Procedure: DILATATION AND CURETTAGE /HYSTEROSCOPY WITH ENDOMETRIAL POLYPECTOMY;  Surgeon: Hermina Staggers, MD;  Location: [facility] OR;  Service: Gynecology;  Laterality: N/A;  . NO PAST SURGERIES       OB History    Gravida  0   Para  0   Term  0   Preterm  0   AB  0   Living  0     SAB  0   TAB  0   Ectopic  0   Multiple  0   Live Births  0           Family History  Problem Relation Age of Onset  . Hypertension Mother   . Diabetes Mother   .  Diabetes Father   . Hypertension Sister   . Hypertension Sister     Social History   Tobacco Use  . Smoking status: Never Smoker  . Smokeless tobacco: Never Used  Vaping Use  . Vaping Use: Never used  Substance Use Topics  . Alcohol use: Yes    Comment: occasionally  . Drug use: No    Home Medications Prior to Admission medications   Medication Sig Start Date End Date Taking? Authorizing Provider  amLODipine (NORVASC) 10 MG tablet Take 1 tablet (10 mg total) by mouth daily. To lower blood pressure 01/16/20 04/16/20 Yes Claiborne Rigg, NP  ibuprofen (ADVIL) 800 MG tablet Take 1 tablet (800 mg total) by mouth every 8 (eight) hours as needed. Patient taking differently: Take 800 mg by mouth  every 8 (eight) hours as needed for headache (pain).  09/02/19  Yes Gerrit Heck, CNM  labetalol (NORMODYNE) 100 MG tablet Take 1 tablet (100 mg total) by mouth 2 (two) times daily. To lower blood pressure and heart rate 01/16/20 04/16/20 Yes Claiborne Rigg, NP  levonorgestrel (MIRENA) 20 MCG/24HR IUD 1 each by Intrauterine route once. Implanted March 2021   Yes [provider]  norethindrone (AYGESTIN) 5 MG tablet Take 1 tablet (5 mg total) by mouth daily. 04/08/20  Yes Constant, Gigi Gin, MD    Allergies    Patient has no known allergies.  Review of Systems   Review of Systems  Constitutional: Positive for chills and fatigue. Negative for diaphoresis, fever and unexpected weight change.  HENT: Negative for congestion, rhinorrhea, sinus pressure, sinus pain, sneezing and sore throat.   Respiratory: Positive for shortness of breath (x2 days). Negative for cough and wheezing.   Cardiovascular: Positive for chest pain (substernal x 2days). Negative for palpitations and leg swelling.  Gastrointestinal: Negative for abdominal distention, abdominal pain, constipation, diarrhea, nausea and vomiting.  Genitourinary: Positive for frequency. Negative for difficulty urinating, dysuria and flank pain.  Musculoskeletal: Negative for arthralgias and myalgias.  Skin: Negative for color change, pallor and rash.  Neurological: Negative for dizziness, light-headedness and headaches.  Psychiatric/Behavioral: Negative for confusion.    Physical Exam Updated Vital Signs BP (!) 151/99   Pulse 91   Temp 98.4 F (36.9 C)   Resp (!) 26   Ht 5\' 8"  (1.727 m)   Wt 124 kg   LMP 04/09/2020   SpO2 100%   BMI 41.57 kg/m   Physical Exam Constitutional:      General: She is not in acute distress.    Appearance: She is obese. She is not ill-appearing, toxic-appearing or diaphoretic.  HENT:     Head: Normocephalic and atraumatic.  Eyes:     Extraocular Movements: Extraocular movements intact.   Cardiovascular:     Rate and Rhythm: Regular rhythm. Tachycardia present.     Pulses:          Dorsalis pedis pulses are 3+ on the right side and 3+ on the left side.     Heart sounds: Normal heart sounds.  Pulmonary:     Effort: Pulmonary effort is normal.     Breath sounds: Normal breath sounds.  Chest:     Chest wall: No tenderness.  Abdominal:     General: There is no distension.     Palpations: Abdomen is soft.     Tenderness: There is no abdominal tenderness.  Musculoskeletal:     Cervical back: Normal range of motion.     Right lower leg: Tenderness (pt indicates  she has been having pain in her right leg for several years ) present. No swelling. No edema.     Left lower leg: No swelling. No edema.     Comments: Right and Left Calf both measured 44cm   Skin:    General: Skin is warm and dry.  Neurological:     Mental Status: She is alert and oriented to person, place, and time.     ED Results / Procedures / Treatments   Labs (all labs ordered are listed, but only abnormal results are displayed) Labs Reviewed  URINALYSIS, ROUTINE W REFLEX MICROSCOPIC - Abnormal; Notable for the following components:      Result Value   Specific Gravity, Urine >1.046 (*)    Ketones, ur 20 (*)    Protein, ur 30 (*)    All other components within normal limits  HEPATIC FUNCTION PANEL - Abnormal; Notable for the following components:   Bilirubin, Direct 0.3 (*)    All other components within normal limits  LIPASE, BLOOD  LACTIC ACID, PLASMA  I-STAT BETA HCG BLOOD, ED ([facility], [facility], [facility] ONLY)  TROPONIN I (HIGH SENSITIVITY)  TROPONIN I (HIGH SENSITIVITY)    EKG EKG Interpretation  Date/Time:  Friday April 16 2020 13:36:13 EST Ventricular Rate:  106 PR Interval:  124 QRS Duration: 76 QT Interval:  326 QTC Calculation: 433 R Axis:   29 Text Interpretation: Sinus tachycardia Otherwise normal ECG No STEMI Confirmed by Alona BeneLong, Joshua [phone]([id number]) on 04/16/2020 8:55:52 PM   Radiology DG  Chest 2 View  Result Date: 04/16/2020 CLINICAL DATA:  Chest pain and short of breath.  Hypertension EXAM: CHEST - 2 VIEW COMPARISON:  None. FINDINGS: The heart size and mediastinal contours are within normal limits. Both lungs are clear. The visualized skeletal structures are unremarkable. IMPRESSION: No active cardiopulmonary disease. Electronically Signed   By: Marlan Palauharles  Clark M.D.   On: 04/16/2020 13:15   CT Angio Chest PE W/Cm &/Or Wo Cm  Result Date: 04/16/2020 CLINICAL DATA:  Positive D-dimer, chest pain most severe when laying supine EXAM: CT ANGIOGRAPHY CHEST WITH CONTRAST TECHNIQUE: Multidetector CT imaging of the chest was performed using the standard protocol during bolus administration of intravenous contrast. Multiplanar CT image reconstructions and MIPs were obtained to evaluate the vascular anatomy. CONTRAST:  100mL OMNIPAQUE IOHEXOL 350 MG/ML SOLN COMPARISON:  Radiograph 04/16/2020 FINDINGS: Cardiovascular: Satisfactory opacification of pulmonary arteries. No large central or lobar filling defects. More distal evaluation limited by respiratory motion artifact. Central pulmonary arteries are normal caliber. Normal heart size. No pericardial effusion. Aortic root and ascending aorta suboptimally assessed given pulsation artifact. No clear acute luminal abnormality. No periaortic stranding or hemorrhage. Normal caliber thoracic aorta. Normal 3 vessel branching of the aortic arch. Proximal great vessels are unremarkable. No major venous abnormalities. Mediastinum/Nodes: Wedge-shaped soft tissue attenuation with fatty stippling in the anterior mediastinum. Trace fluid in the pericardial recesses. No free mediastinal fluid or gas. Normal thyroid gland and thoracic inlet. No acute abnormality of the trachea or esophagus. No worrisome mediastinal, hilar or axillary adenopathy. Lungs/Pleura: No consolidation, features of edema, pneumothorax, or effusion. No suspicious pulmonary nodules or masses. Mild  atelectatic change, likely accentuated by imaging during exhalation. Upper Abdomen: No acute abnormalities present in the visualized portions of the upper abdomen. Musculoskeletal: No acute or suspicious chest wall abnormalities. Small amount of gas in the first costal cartilaginous junctions is a nonspecific finding, often incidental. Soft tissues are unremarkable. Review of the MIP images confirms the above findings. IMPRESSION:  1. No evidence of large central or lobar pulmonary embolism. More distal evaluation limited by respiratory motion artifact. 2. Wedge-shaped soft tissue attenuation with fatty stippling in the anterior mediastinum, suggestive of a thymic remnant. 3. No other acute intrathoracic process. Electronically Signed   By: Kreg Shropshire M.D.   On: 04/16/2020 19:46    Procedures Procedures (including critical care time)  Medications Ordered in ED Medications  sodium chloride 0.9 % bolus 1,000 mL (0 mLs Intravenous Stopped 04/16/20 2228)    Followed by  0.9 %  sodium chloride infusion (0 mLs Intravenous Stopped 04/17/20 0011)  labetalol (NORMODYNE) injection 5 mg (5 mg Intravenous Not Given 04/16/20 2241)  iohexol (OMNIPAQUE) 350 MG/ML injection 100 mL (100 mLs Intravenous Contrast Given 04/16/20 1852)  ketorolac (TORADOL) 30 MG/ML injection 30 mg (30 mg Intravenous Given 04/16/20 2137)  alum & mag hydroxide-simeth (MAALOX/MYLANTA) 200-200-20 MG/5ML suspension 15 mL (15 mLs Oral Given 04/16/20 2138)  amLODipine (NORVASC) tablet 10 mg (10 mg Oral Given 04/16/20 2241)    ED Course  I have reviewed the triage vital signs and the nursing notes.  Pertinent labs & imaging results that were available during my care of the patient were reviewed by me and considered in my medical decision making (see chart for details).    MDM Rules/Calculators/A&P HEAR Score: 2                         Chief complaint: chest pain and shortness of breath x2 days.    Prior to arrival at the emergency  department patient was seen at [facility] urgent care center.  BMP, D-dimer, CBC and chest x-ray were obtained at the urgent care. D-dimer obtained and urgent care was elevated at 0.83.  BMP showed a potassium of 3.3 and BUN of 5.   CBC shows leukocytosis with white count 15.7.  Chest x-ray.  No signs of active cardiopulmonary disease.  The emergent differential diagnosis of chest pain includes: Acute coronary syndrome, pericarditis, aortic dissection, pulmonary embolism, tension pneumothorax, pneumonia, and esophageal rupture.  The emergent differential diagnosis for shortness of breath includes, but is not limited to, Pulmonary edema, bronchoconstriction, Pneumonia, Pulmonary embolism, Pneumotherax/ Hemothorax, Dysrythmia, ACS.   Patient is Wells score for DVT is 0 points putting her in the low risk group for DVT.  Patient's Wells score for PE is 4.5 and her at moderate risk for PE.  Patient's heart score was 2 putting her in a low risk.    Due to patient's elevated chest pain, shortness of breath, D-dimer, and tachycardia, CT angio of the chest was ordered.  Patients increased urinary frequency, leukocytosis and tachycardia also prompted evaluation for potential infection.  Therefore, urinalysis with microscopy and lactic acid were ordered.  Due to patient's chest pain serial troponin EKG were ordered.    Serial troponin was 3, 3, with a delta of 0.  This combined with findings from patient's EKG make ACS less likely.  UA showed ketones 20, protein 30 however no signs of infection.  Lactic acid was 1.5.  There is no obvious source of the patient's leukocytosis.  It is doubtful that it is sepsis.  CT angio chest showed no evidence of large central or lobar pulmonary embolism.  More distal evaluation limited by respiratory motion artifact.  Wedge-shaped soft tissue attenuation with fatty stippling in the anterior mediastinum suggestive of a thymic remnant.  No other acute intrathoracic  process.  Patient's lipase was within normal limits.  Hepatic function panel showed bilirubin slightly elevated at 0.3, all other values within normal limits.  Patient was given fluids, Mylanta, Toradol to help with her symptoms.  Patient reports improvement in her chest pain and shortness of breath.  Patient was given a dose of Norvasc which she is prescribed in the outpatient setting.  Patient was informed that based on her findings ACS, PE, sepsis were unlikely.  Patient was told to restart her hypertension medication and to follow-up with her PCP.  We discussed the risks of unmanaged hypertension, and return protocols if her symptoms worsen.  Patient expressed understanding of this conversation.       Pt had leukocytosis, unclear sourse, dout sepsis    Final Clinical Impression(s) / ED Diagnoses Final diagnoses:  Precordial chest pain    Rx / DC Orders ED Discharge Orders    None      Berneice Heinrich 04/17/20 0026  Maia Plan, MD 04/19/20 2027

## 2020-04-16 NOTE — ED Triage Notes (Addendum)
Pt reports chest pain x2 days with radiation to back. Endorses some sob but denies radiation of pain. Edema present to R leg for a while. D dimer elevated at 0.83 at urgent care, sent here for further eval. A/ox4, resp e/u, nad.

## 2020-04-16 NOTE — ED Notes (Signed)
Ambulatory to bathroom at this time

## 2020-04-16 NOTE — Discharge Instructions (Signed)
EKG in the emergency department today for chest pain and shortness of breath.  We completed a CT angio chest to evaluate you for a pulmonary embolism.  This showed no evidence of a pulmonary embolism.    While in the department your blood pressure was elevated.  As we discussed you need to resume taking your blood pressure medication.  High blood pressure can cause long term, potentially serious, damage if left untreated.    You need to call your primary care doctor to schedule a  visit to discuss flush blood pressure management and follow-up from your visit in the emergency department today.     Please seek emergent medical attention right away if : Your chest pain is worse. You have a cough that gets worse, or you cough up blood. You have very bad (severe) pain in your belly (abdomen). You pass out (faint). You have either of these for no clear reason: Sudden chest discomfort. Sudden discomfort in your arms, back, neck, or jaw. You have shortness of breath at any time. You suddenly start to sweat, or your skin gets clammy. You feel sick to your stomach (nauseous). You throw up (vomit). You suddenly feel lightheaded or dizzy. You feel very weak or tired. Your heart starts to beat fast, or it feels like it is skipping beats.

## 2020-04-16 NOTE — ED Provider Notes (Signed)
[facility]    CSN: [id number] Arrival date & time: 04/16/20  1028      History   Chief Complaint Chief Complaint  Patient presents with   Chest Pain   Fatigue    HPI Dana Allison is a 34 y.o. female.   Dana Allison presents with complaints of chest pain and shortness of breath which started two days ago. Pain with deep breathing. No cough. No dizziness. No fevers. No congestion. No history of asthma. Doesn't smoke. She is on oral norethindrone as well as has an IUD. No diaphoresis. No nausea. No jaw or arm pain. Denies any previous similar. On chart review patient had covid-19, tested positive on 03/15/2020. Received monoclonal antibody infusion. States she did not have any chest pain while she had covid-19.  History of hypertension, pre-diabetes, obesity.    ROS per HPI, negative if not otherwise mentioned.      Past Medical History:  Diagnosis Date   Endometrial polyp 03/17/2019   Hypertension    Morbid obesity (HCC)    Pre-diabetes    per patient    Patient Active Problem List   Diagnosis Date Noted   Morbid obesity (HCC)    Hypertension    Pre-diabetes    Post-operative state 05/09/2019   DUB (dysfunctional uterine bleeding) 09/19/2018   OBESITY, NOS 07/26/2006    Past Surgical History:  Procedure Laterality Date   HYSTEROSCOPY WITH D & C N/A 04/08/2019   Procedure: DILATATION AND CURETTAGE /HYSTEROSCOPY WITH ENDOMETRIAL POLYPECTOMY;  Surgeon: Hermina Staggers, MD;  Location: [facility] OR;  Service: Gynecology;  Laterality: N/A;   NO PAST SURGERIES      OB History    Gravida  0   Para  0   Term  0   Preterm  0   AB  0   Living  0     SAB  0   TAB  0   Ectopic  0   Multiple  0   Live Births  0            Home Medications    Prior to Admission medications   Medication Sig Start Date End Date Taking? Authorizing Provider  amLODipine (NORVASC) 10 MG tablet Take 1 tablet (10 mg total) by mouth daily. To lower  blood pressure 01/16/20 04/15/20  Claiborne Rigg, NP  ibuprofen (ADVIL) 800 MG tablet Take 1 tablet (800 mg total) by mouth every 8 (eight) hours as needed. 09/02/19   Gerrit Heck, CNM  labetalol (NORMODYNE) 100 MG tablet Take 1 tablet (100 mg total) by mouth 2 (two) times daily. To lower blood pressure and heart rate 01/16/20 04/15/20  Claiborne Rigg, NP  norethindrone (AYGESTIN) 5 MG tablet Take 1 tablet (5 mg total) by mouth daily. 04/08/20   Constant, Peggy, MD    Family History Family History  Problem Relation Age of Onset   Hypertension Mother    Diabetes Mother    Diabetes Father    Hypertension Sister    Hypertension Sister     Social History Social History   Tobacco Use   Smoking status: Never Smoker   Smokeless tobacco: Never Used  Building services engineer Use: Never used  Substance Use Topics   Alcohol use: Yes    Comment: occasionally   Drug use: No     Allergies   Patient has no known allergies.   Review of Systems Review of Systems   Physical Exam Triage Vital Signs ED  Triage Vitals  Enc Vitals Group     BP 04/16/20 1039 (!) 142/97     Pulse Rate 04/16/20 1039 (!) 101     Resp 04/16/20 1039 (!) 25     Temp 04/16/20 1039 97.6 F (36.4 C)     Temp Source 04/16/20 1039 Oral     SpO2 04/16/20 1039 (!) 1 %     Weight --      Height --      Head Circumference --      Peak Flow --      Pain Score 04/16/20 1042 7     Pain Loc --      Pain Edu? --      Excl. in GC? --    No data found.  Updated Vital Signs BP (!) 142/97 (BP Location: Right Arm)    Pulse (!) 101    Temp 97.6 F (36.4 C) (Oral)    Resp (!) 25    LMP 04/09/2020    SpO2 97%   Visual Acuity Right Eye Distance:   Left Eye Distance:   Bilateral Distance:    Right Eye Near:   Left Eye Near:    Bilateral Near:     Physical Exam Constitutional:      General: She is not in acute distress.    Appearance: She is well-developed.  Cardiovascular:     Rate and Rhythm: Normal  rate.  Pulmonary:     Effort: Pulmonary effort is normal.     Breath sounds: Normal breath sounds.  Musculoskeletal:     Comments: Calves are soft and non tender   Skin:    General: Skin is warm and dry.  Neurological:     Mental Status: She is alert and oriented to person, place, and time.    EKG:  NSR rate of 98 . Previous EKG was available for review. No stwave changes as interpreted by me.    UC Treatments / Results  Labs (all labs ordered are listed, but only abnormal results are displayed) Labs Reviewed  D-DIMER, QUANTITATIVE (NOT AT [facility]) - Abnormal; Notable for the following components:      Result Value   D-Dimer, Quant 0.83 (*)    All other components within normal limits  BASIC METABOLIC PANEL - Abnormal; Notable for the following components:   Potassium 3.3 (*)    BUN 5 (*)    All other components within normal limits  CBC - Abnormal; Notable for the following components:   WBC 15.7 (*)    All other components within normal limits    EKG   Radiology No results found.  Procedures Procedures (including critical care time)  Medications Ordered in UC Medications - No data to display  Initial Impression / Assessment and Plan / UC Course  I have reviewed the triage vital signs and the nursing notes.  Pertinent labs & imaging results that were available during my care of the patient were reviewed by me and considered in my medical decision making (see chart for details).     covid-19 infection 1 month ago with monoclonal antibody infusion. Onset of chest pain over the past two days, pain with deep breathing. Some shortness of breath. D-dimer is elevated, unable to determine in which direction this is trending, however this is new chest pain. Also with elevation in WBC today. Recommend further evaluation in the ER now. Patient agreeable to self transporting there now.  Final Clinical Impressions(s) / UC Diagnoses   Final  diagnoses:  Chest pain, unspecified  type  Positive D dimer     Discharge Instructions     With your lab results and your chest pain I do recommend further evaluation in the ER now.     ED Prescriptions    None     PDMP not reviewed this encounter.   Georgetta Haber, NP 04/16/20 1316

## 2020-04-16 NOTE — ED Triage Notes (Signed)
Pt presents with chest pain and fatigue x 2 days. States having pain in the middle of the chest, worse when lay down. States she had back pain and difficulty breathing when lay down. Denies vision changes, dizziness, headache, diarrhea, cough.

## 2020-04-20 ENCOUNTER — Ambulatory Visit: Payer: PRIVATE HEALTH INSURANCE | Admitting: Obstetrics and Gynecology

## 2020-05-03 ENCOUNTER — Other Ambulatory Visit: Payer: Self-pay

## 2020-05-03 ENCOUNTER — Ambulatory Visit (INDEPENDENT_AMBULATORY_CARE_PROVIDER_SITE_OTHER): Payer: PRIVATE HEALTH INSURANCE | Admitting: General Practice

## 2020-05-03 VITALS — BP 128/88 | HR 94 | Ht 68.0 in | Wt 285.0 lb

## 2020-05-03 DIAGNOSIS — Z23 Encounter for immunization: Secondary | ICD-10-CM | POA: Diagnosis not present

## 2020-05-03 NOTE — Progress Notes (Signed)
Dana Allison here for Gardasil 9  Injection.  Injection administered without complication. Patient will return as needed for GYN care.  Marylynn Pearson, RN 05/03/2020  2:41 PM

## 2020-05-04 MED FILL — AMLODIPINE BESYLATE 10 MG T: 10 | 30 days supply | Qty: 30 | Fill #1

## 2020-05-04 MED FILL — NORETHINDRONE 5 MG TABLET: 5 | 30 days supply | Qty: 30 | Fill #1

## 2020-08-26 MED FILL — NORETHINDRONE 5 MG TABLET: 5 | 30 days supply | Qty: 30 | Fill #2

## 2020-08-29 IMAGING — US US PELVIS COMPLETE WITH TRANSVAGINAL
1 series · 15 of 25 positions shown · non-contrast
Comparison: None

CLINICAL DATA: Dysfunctional uterine bleeding.



[Series 1: us pelvis complete with transvaginal · 15 of 84 slices shown]
[im 1/84]
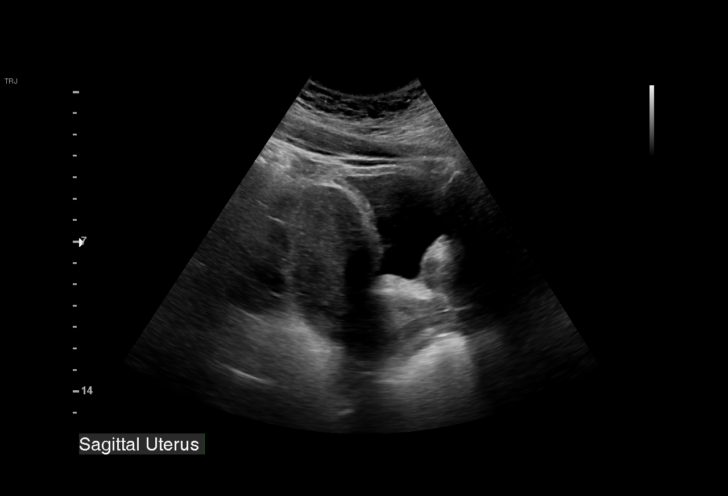
[im 7/84]
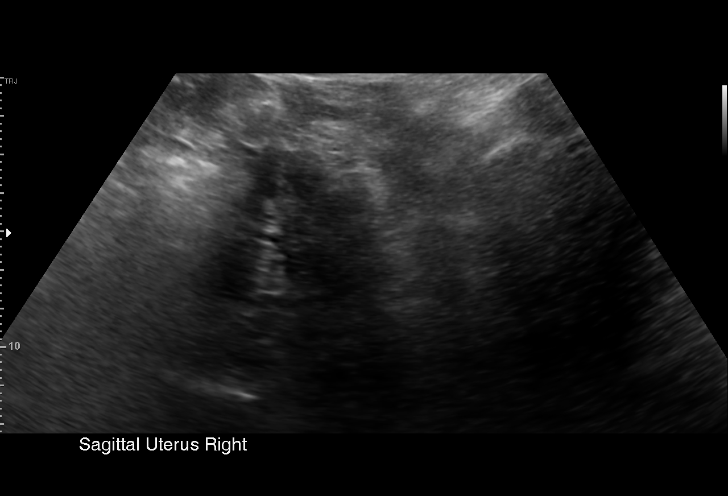
[im 14/84]
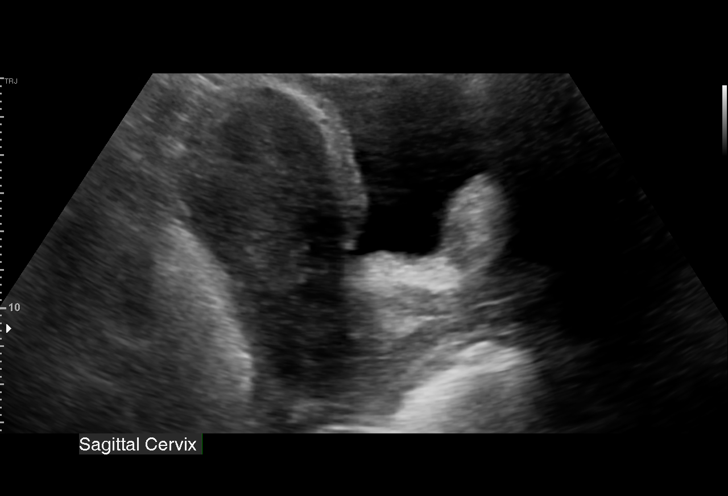
[im 18/84]
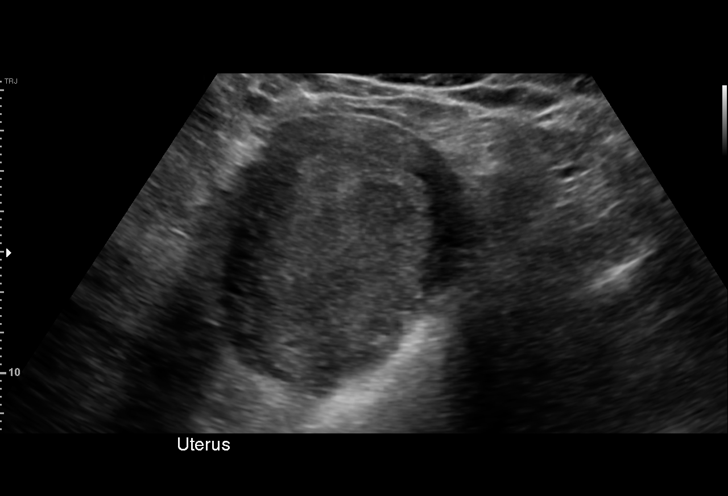
[im 25/84]
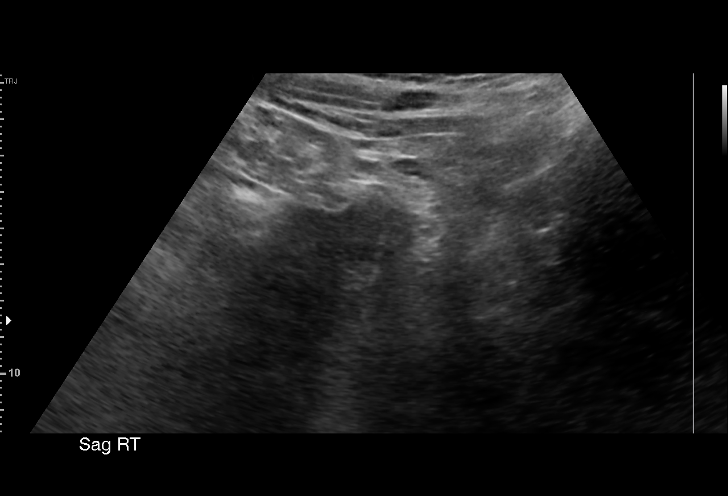
[im 32/84]
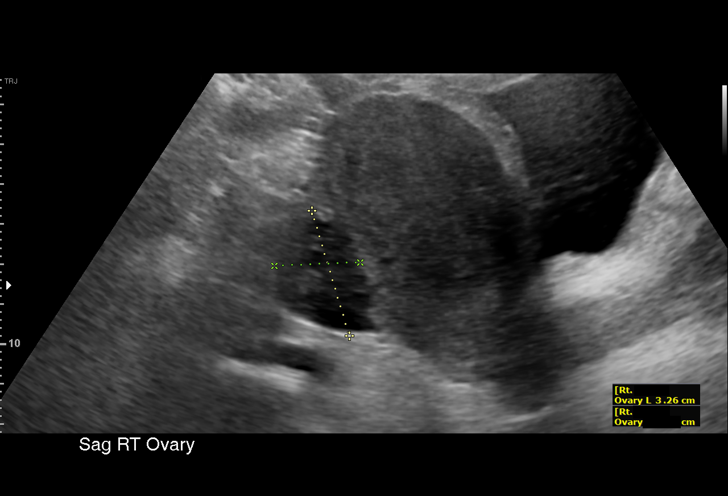
[im 35/84]
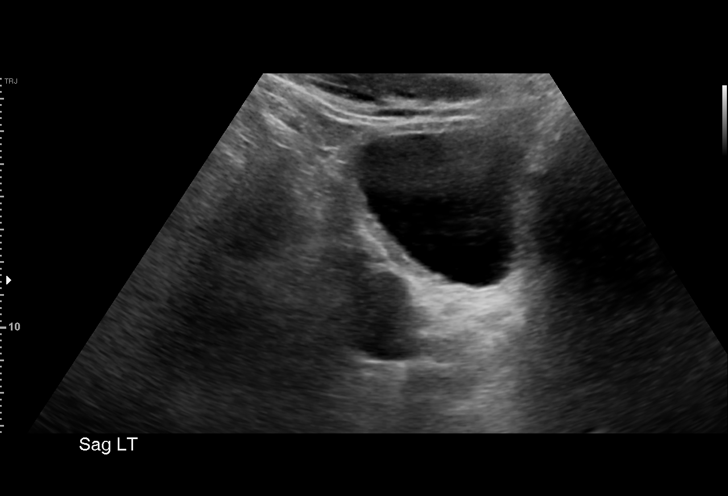
[im 42/84]
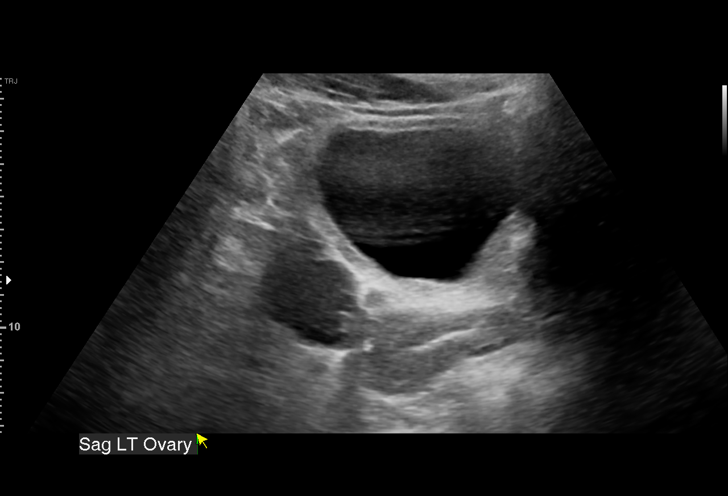
[im 49/84]
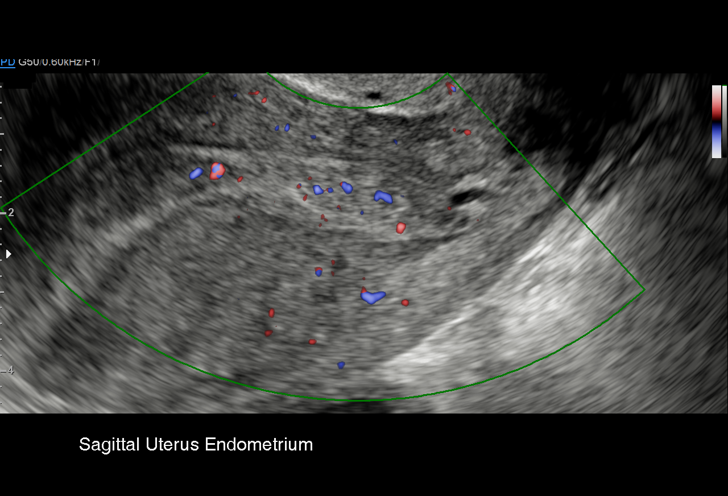
[im 52/84]
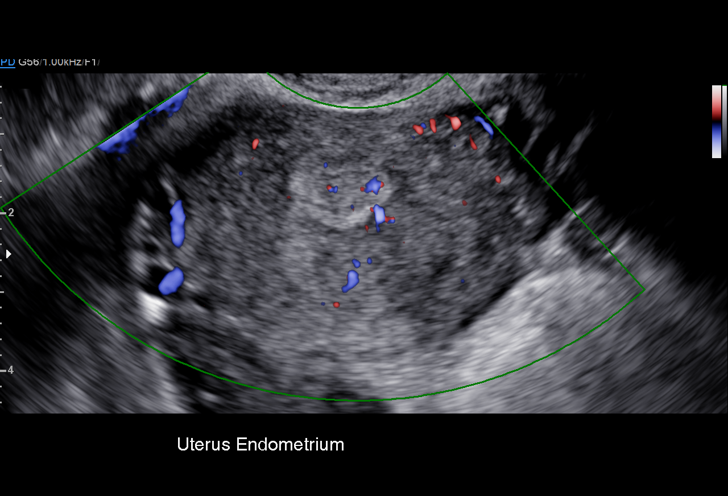
[im 59/84]
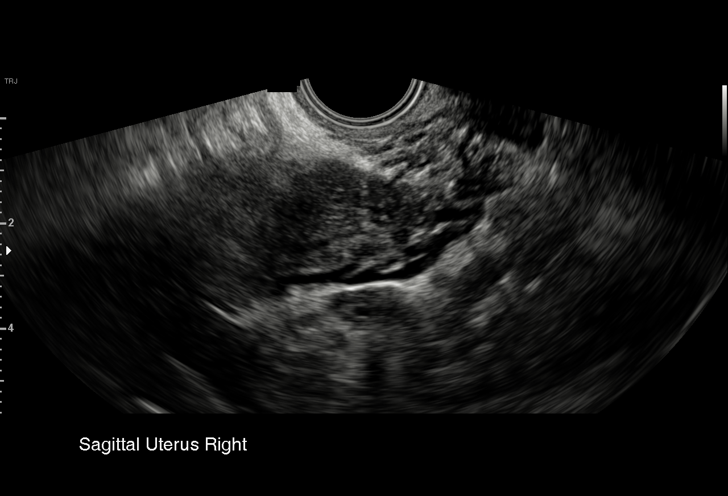
[im 66/84]
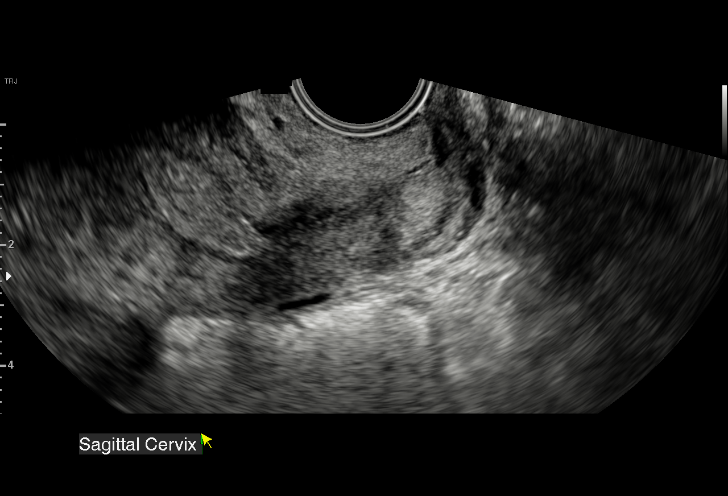
[im 70/84]
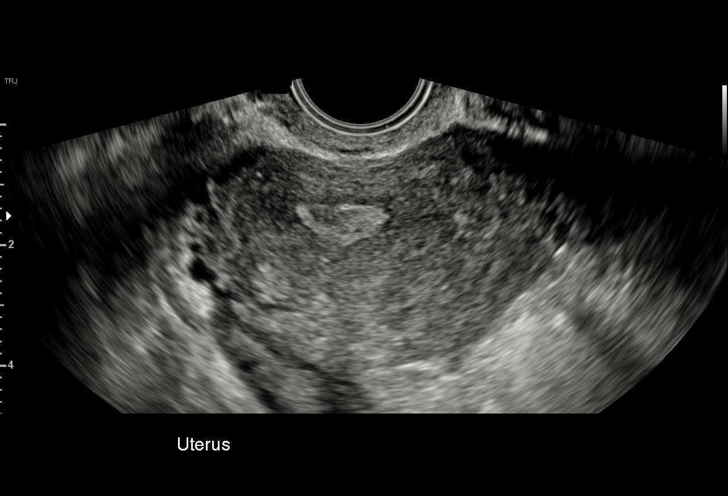
[im 77/84]
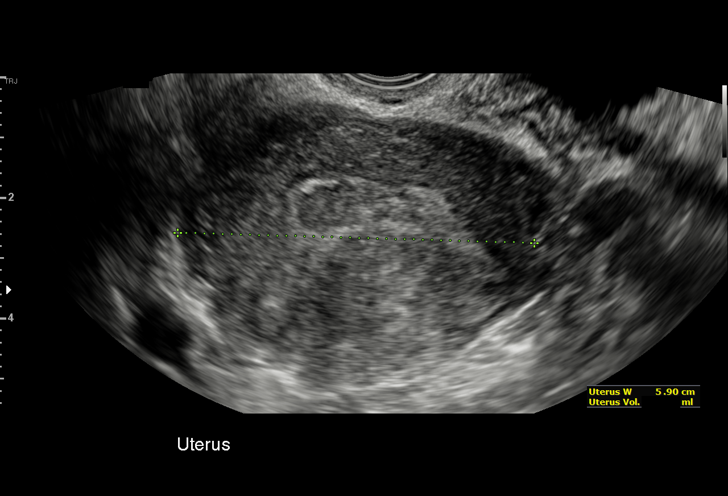
[im 84/84]
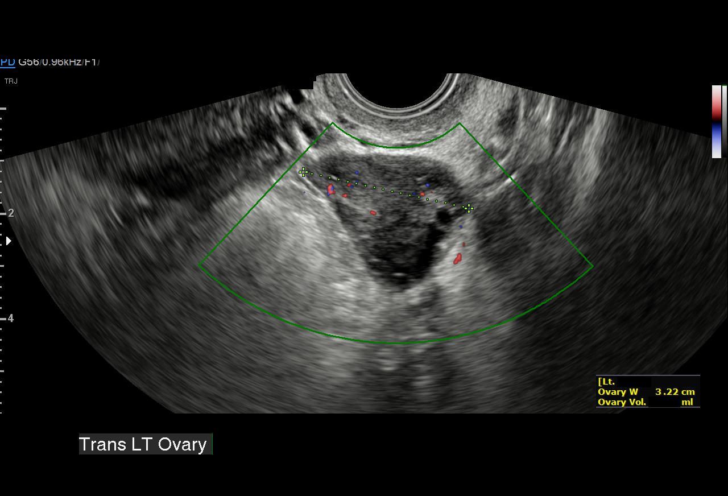

[15 of 25 positions shown; findings below may reference images not displayed]

FINDINGS: Uterus

Measurements: 10.1 x 4.2 x 5.8 cm = volume: 130.2 mL. No fibroids or
other mass visualized.

Endometrium

Thickness: 9 mm. There is a possible 1.5 x 0.7 x 0.8 cm mixed
echogenicity mass within the endometrium lower uterine segment.

Right ovary

Measurements: 4.5 x 2.0 x 3.2 cm = volume: 14.9 mL. Normal
appearance/no adnexal mass.

Left ovary

Measurements: 3.5 x 2.5 x 3.2 cm = volume: 14.7 mL. Normal
appearance/no adnexal mass.

Other findings

No abnormal free fluid.
IMPRESSION: Possible mass within the endometrium of the lower uterine segment.
Consider further evaluation with sonohysterogram for confirmation
prior to hysteroscopy. Endometrial sampling should also be
considered if patient is at high risk for endometrial carcinoma.
(Ref: Radiological Reasoning: Algorithmic Workup of Abnormal Vaginal
Bleeding with Endovaginal Sonography and Sonohysterography. AJR
[id number]; 191:[id number])

These results will be called to the ordering clinician or
representative by the Radiologist Assistant, and communication
documented in the PACS or zVision Dashboard.

## 2020-11-01 ENCOUNTER — Ambulatory Visit (INDEPENDENT_AMBULATORY_CARE_PROVIDER_SITE_OTHER): Payer: Medicaid Other | Admitting: Family Medicine

## 2020-11-01 ENCOUNTER — Other Ambulatory Visit (HOSPITAL_COMMUNITY)
Admission: RE | Admit: 2020-11-01 | Discharge: 2020-11-01 | Disposition: A | Discharge status: Home / Self Care | Payer: Medicaid Other | Source: Ambulatory Visit | Attending: Family Medicine | Admitting: Family Medicine

## 2020-11-01 ENCOUNTER — Other Ambulatory Visit: Payer: Self-pay

## 2020-11-01 ENCOUNTER — Encounter: Payer: Self-pay | Admitting: Family Medicine

## 2020-11-01 VITALS — BP 164/111 | HR 94 | Ht 68.0 in | Wt 289.1 lb

## 2020-11-01 DIAGNOSIS — Z1151 Encounter for screening for human papillomavirus (HPV): Secondary | ICD-10-CM | POA: Insufficient documentation

## 2020-11-01 DIAGNOSIS — R87810 Cervical high risk human papillomavirus (HPV) DNA test positive: Secondary | ICD-10-CM | POA: Insufficient documentation

## 2020-11-01 DIAGNOSIS — Z113 Encounter for screening for infections with a predominantly sexual mode of transmission: Secondary | ICD-10-CM | POA: Insufficient documentation

## 2020-11-01 DIAGNOSIS — Z01419 Encounter for gynecological examination (general) (routine) without abnormal findings: Secondary | ICD-10-CM | POA: Diagnosis not present

## 2020-11-01 DIAGNOSIS — R87610 Atypical squamous cells of undetermined significance on cytologic smear of cervix (ASC-US): Secondary | ICD-10-CM | POA: Diagnosis not present

## 2020-11-01 DIAGNOSIS — Z5941 Food insecurity: Secondary | ICD-10-CM | POA: Diagnosis not present

## 2020-11-01 DIAGNOSIS — Z124 Encounter for screening for malignant neoplasm of cervix: Secondary | ICD-10-CM

## 2020-11-01 DIAGNOSIS — R87612 Low grade squamous intraepithelial lesion on cytologic smear of cervix (LGSIL): Secondary | ICD-10-CM | POA: Insufficient documentation

## 2020-11-01 DIAGNOSIS — Z01411 Encounter for gynecological examination (general) (routine) with abnormal findings: Secondary | ICD-10-CM

## 2020-11-01 NOTE — Progress Notes (Signed)
See documentation in the note authored by Medical student [employer]. I have placed an attestation on this note and also placed addendum to reflect the exam I performed.

## 2020-11-01 NOTE — Progress Notes (Signed)
lab4  

## 2020-11-01 NOTE — Progress Notes (Addendum)
   GYNECOLOGY ANNUAL PREVENTATIVE CARE ENCOUNTER NOTE  Subjective:   Dana Allison is a 35 y.o. G0P0000 female here for a routine annual gynecologic exam and repeat Pap smear after a positive result 1 year ago.  Current complaints: menstrual bleeding s/p mirena, vaginal discharge and high BP. She states that she has continued to have heavy vaginal bleeding once every 2-3 months even after the mirena and has been taking norethindrone which has reduced the bleeding to 2-3 days. Regarding the vaginal discharge, she states that it started 2 months ago, is brownish in color with no malodor, associated bleeding, or inflammatory symptoms. She stopped taking her BP meds in January after switching jobs and losing her insurance and states that she cannot currently afford her labetalol and amlodipine. Denies abnormal vaginal bleeding except as mentioned above, pelvic pain, problems with intercourse or other gynecologic concerns.    Gynecologic History No LMP recorded. (Menstrual status: IUD). Contraception: IUD Last Pap: 09/02/2019 . 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   The following portions of the patient's history were reviewed and updated as appropriate: allergies, current medications, past family history, past medical history, past social history, past surgical history and problem list.  Review of Systems Pertinent items are noted in HPI.   Objective:  BP (!) 164/111   Pulse 94   Ht 5\' 8"  (1.727 m)   Wt 289 lb 1.6 oz (131.1 kg)   BMI 43.96 kg/m  CONSTITUTIONAL: Well-developed, obese female in no acute distress.  EYES:  No scleral icterus.   SKIN: Skin is warm and dry.  NEUROLOGIC: Alert and oriented to person, place, and time.  RESPIRATORY: No increased WOB PELVIC: Normal appearing external genitalia; normal appearing vaginal mucosa and cervix. Physiologic discharge. No abnormal discharge noted. Pap smear obtained.  No IUD strings seen. Normal  uterine size, no other palpable masses, no uterine or adnexal tenderness.  Assessment and Plan:   #Annual gynecologic examination with pap smear:  Will follow up results of pap smear and manage accordingly. STI screen also ordered today. Routine preventative health maintenance measures emphasized.  #Menstrual bleeding s/p mirena: Counseled on bleeding not being completely absent being common in pts after Mirena and encouraged taking Norethindrone as needed to reduce symptoms.  #Vaginal discharge: Discharge most likely physiological. STI screen ordered per patient interest. Will follow up with results if positive.  #Elevated BP: Counseled on importance of continuing BP meds. Printed prescription coupons for her previous meds amlodipine and labetalol and discussed PCP visit in 1 month for another BP check.  Please refer to After Visit Summary for other counseling recommendations.   Return in about 4 weeks (around 11/29/2020) for With PCP for BP check back on medications.  Katalyna Socarras 01/30/2021, Medical Student  Attestation of Attending Supervision of Student:  I confirm that I have verified the information documented in the medical student's note and that I have also personally reperformed the history, physical exam and all medical decision making activities.  I have verified that all services and findings are accurately documented in this student's note; and I agree with management and plan as outlined in the documentation.   Gabriel Rung, MD, MPH, ABFM, [facility] Attending Physician Center for [facility]

## 2020-11-04 LAB — CYTOLOGY - PAP
Chlamydia: NEGATIVE
Comment: NEGATIVE
Comment: NEGATIVE
Comment: NEGATIVE
Comment: NEGATIVE
Comment: NORMAL
Diagnosis: UNDETERMINED — AB
HPV 16: NEGATIVE
HPV 18 / 45: NEGATIVE
High risk HPV: POSITIVE — AB
Neisseria Gonorrhea: NEGATIVE
Trichomonas: NEGATIVE

## 2020-11-08 ENCOUNTER — Telehealth: Payer: Self-pay | Admitting: *Deleted

## 2020-11-08 NOTE — Telephone Encounter (Signed)
-----   Message from Federico Flake, MD sent at 11/08/2020 10:57 AM EDT ----- ASCUS, HR HPV positive. Patient needs colposcopy

## 2020-11-08 NOTE — Telephone Encounter (Signed)
I called Dana Allison and reviewed results and recommendations per Dr. Alvester Morin. I explained registrar will contact her with an appointment. She voices understanding. Baltazar Pekala,RN

## 2020-12-10 ENCOUNTER — Ambulatory Visit: Payer: PRIVATE HEALTH INSURANCE | Admitting: Family Medicine

## 2020-12-17 ENCOUNTER — Encounter: Payer: Self-pay | Admitting: Obstetrics & Gynecology

## 2020-12-17 ENCOUNTER — Other Ambulatory Visit: Payer: Self-pay

## 2020-12-17 ENCOUNTER — Ambulatory Visit (INDEPENDENT_AMBULATORY_CARE_PROVIDER_SITE_OTHER): Payer: Medicaid Other | Admitting: Obstetrics & Gynecology

## 2020-12-17 ENCOUNTER — Other Ambulatory Visit (HOSPITAL_COMMUNITY)
Admission: RE | Admit: 2020-12-17 | Discharge: 2020-12-17 | Disposition: A | Discharge status: Home / Self Care | Payer: Medicaid Other | Source: Ambulatory Visit | Attending: Obstetrics & Gynecology | Admitting: Obstetrics & Gynecology

## 2020-12-17 VITALS — BP 154/121 | HR 87 | Wt 285.9 lb

## 2020-12-17 DIAGNOSIS — R87810 Cervical high risk human papillomavirus (HPV) DNA test positive: Secondary | ICD-10-CM

## 2020-12-17 DIAGNOSIS — R87619 Unspecified abnormal cytological findings in specimens from cervix uteri: Secondary | ICD-10-CM | POA: Diagnosis not present

## 2020-12-17 DIAGNOSIS — R87610 Atypical squamous cells of undetermined significance on cytologic smear of cervix (ASC-US): Secondary | ICD-10-CM | POA: Diagnosis present

## 2020-12-17 NOTE — Patient Instructions (Signed)
COLPOSCOPY POST-PROCEDURE INSTRUCTIONS  You may take Ibuprofen, Aleve or Tylenol for cramping if needed.  If Monsel's solution was used, you will have a black discharge.  Light bleeding is normal.  If bleeding is heavier than your period, please call.  Put nothing in your vagina until the bleeding or discharge stops (usually 2 or3 days).  We will call you within one week with biopsy results or discuss the results at your follow-up appointment if needed.    ENDOMETRIAL BIOPSY POST-PROCEDURE INSTRUCTIONS  You may take Ibuprofen, Aleve or Tylenol for pain if needed.  Cramping should resolve within in 24 hours.  You may have a small amount of spotting.  You should wear a mini pad for the next few days.  You may have intercourse after 24 hours.  You need to call if you have any pelvic pain, fever, heavy bleeding or foul smelling vaginal discharge.  Shower or bathe as normal  6. We will call you within one week with results or we will discuss   the results at your follow-up appointment if needed.

## 2020-12-17 NOTE — Progress Notes (Signed)
    GYNECOLOGY OFFICE PROCEDURE NOTE  35 y.o. 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 Discussed role for HPV in cervical dysplasia, need for surveillance. Patient has received HPV vaccine series.  Also discussed findings of atypical endometrial cells. Given her morbid obesity, she is at risk for endometrial pathology, so endometrial biopsy is recommended, in addition to colposcopy and endocervical curetting.   COLPOSCOPY AND ENDOMETRIAL BIOPSY     The indications for colposcopy and endometrial biopsy were reviewed.   Risks including cramping, bleeding, infection, uterine perforation, inadequate specimen and need for additional procedures  were discussed. The patient states she understands and agrees to undergo procedure today. Consent was signed. Time out was performed. Urine HCG was unable to be done as patient was unable to urinate, but has IUD in place and is not sexually active.    Patient was placed in lithotomy position. Cervix viewed with speculum and colposcope after application of acetic acid.  Colposcopy adequate? Yes.   No visible ectocervical lesions. Endocervical curetting specimen obtained. The cervix was prepped with Betadine. After administration of Hurricane spray to the anterior lip of the cervix, a single-toothed tenaculum was placed on the anterior lip of the cervix to stabilize it. The 3 mm pipelle was introduced into the endometrial cavity without difficulty to a depth of 8 cm, and a moderate amount of tissue was obtained and sent to pathology. Of note, the IUD was palpated inside the uterus, but no strings were visible or palpated in the cervix.  All the instruments were removed from the patient's vagina. Minimal bleeding was noted.  All specimens were labeled and sent to pathology.  The patient tolerated the procedure well.  Patient was given post procedure instructions.  Will follow up pathology  and manage accordingly; patient will be contacted with results and recommendations.  Routine preventative health maintenance measures emphasized.    Jaynie Collins, MD, FACOG Obstetrician & Gynecologist, [facility] for [employer], [facility]

## 2020-12-20 ENCOUNTER — Encounter: Payer: Self-pay | Admitting: Obstetrics & Gynecology

## 2020-12-20 DIAGNOSIS — D069 Carcinoma in situ of cervix, unspecified: Secondary | ICD-10-CM | POA: Insufficient documentation

## 2020-12-20 LAB — SURGICAL PATHOLOGY

## 2020-12-20 NOTE — Progress Notes (Signed)
12/17/2020 Colposcopy ECC and EMB pathology (ASCUS + HRHPV with atypical endocervical cells on pap smear done 11/01/2020) A. ENDOCERVIX, CURETTAGE:  - High grade squamous intraepithelial lesion, CIN-III (severe dysplasia/CIS).  - Atypical endocervical cells (the atypical endocervical glands are largely detached. However, the fragment with CIN-III does appear to have very focal involvement of endocervical glands, which likely accounts for the atypia) B. ENDOMETRIUM, BIOPSY:  - Polypoid fragments of inactive endometrium with hormone effect.  - No hyperplasia or malignancy.   LEEP recommended given severe cervical dysplasia.  Please call to inform patient of results and recommendations.  Jaynie Collins, MD

## 2020-12-21 ENCOUNTER — Telehealth: Payer: Self-pay | Admitting: General Practice

## 2020-12-21 NOTE — Telephone Encounter (Signed)
-----   Message from Tereso Newcomer, MD sent at 12/20/2020  1:10 PM EDT ----- 12/17/2020 Colposcopy ECC and EMB pathology (ASCUS + HRHPV with atypical endocervical cells on pap smear done 11/01/2020) A. ENDOCERVIX, CURETTAGE:  - High grade squamous intraepithelial lesion, CIN-III (severe dysplasia/CIS).  - Atypical endocervical cells (the atypical endocervical glands are largely detached. However, the fragment with CIN-III does appear to have very focal involvement of endocervical glands, which likely accounts for the atypia) B. ENDOMETRIUM, BIOPSY:  - Polypoid fragments of inactive endometrium with hormone effect.  - No hyperplasia or malignancy.   LEEP recommended given severe cervical dysplasia.  Please call to inform patient of results and recommendations.  Jaynie Collins, MD

## 2020-12-21 NOTE — Telephone Encounter (Signed)
Called patient & explained LEEP/results. Advised someone from the front office will contact her with an appt. Patient verbalized understanding

## 2021-01-27 ENCOUNTER — Other Ambulatory Visit: Payer: Self-pay

## 2021-01-27 ENCOUNTER — Ambulatory Visit: Payer: PRIVATE HEALTH INSURANCE | Attending: Physician Assistant | Admitting: Physician Assistant

## 2021-01-27 ENCOUNTER — Ambulatory Visit: Payer: PRIVATE HEALTH INSURANCE | Admitting: Obstetrics & Gynecology

## 2021-01-27 DIAGNOSIS — R000 Tachycardia, unspecified: Secondary | ICD-10-CM | POA: Diagnosis not present

## 2021-01-27 DIAGNOSIS — Z131 Encounter for screening for diabetes mellitus: Secondary | ICD-10-CM

## 2021-01-27 DIAGNOSIS — Z13220 Encounter for screening for lipoid disorders: Secondary | ICD-10-CM

## 2021-01-27 DIAGNOSIS — I10 Essential (primary) hypertension: Secondary | ICD-10-CM

## 2021-01-27 MED ORDER — AMLODIPINE BESYLATE 10 MG PO TABS
ORAL_TABLET | Freq: Every day | ORAL | 1 refills | Status: DC
  Filled 2021-01-27: qty 30, 30d supply, fill #0
  Filled 2021-03-08: qty 30, 30d supply, fill #1
  Filled 2021-06-09: qty 30, 30d supply, fill #0
  Filled 2021-09-20: qty 30, 30d supply, fill #1
  Filled 2021-12-29: qty 30, 30d supply, fill #2
  Filled 2022-01-19: qty 30, 30d supply, fill #3

## 2021-01-27 MED ORDER — LABETALOL HCL 100 MG PO TABS
100.0000 mg | ORAL_TABLET | Freq: Two times a day (BID) | ORAL | 1 refills | Status: DC
  Filled 2021-01-27 – 2021-06-09 (×3): qty 60, 30d supply, fill #0
  Filled 2021-09-20: qty 60, 30d supply, fill #1
  Filled 2021-12-29: qty 60, 30d supply, fill #2

## 2021-01-27 NOTE — Progress Notes (Signed)
Virtual Visit via Telephone Note  I connected with Dana Allison on 01/27/21 at  8:50 AM EDT by telephone and verified that I am speaking with the correct person using two identifiers.  Location: Patient: home Provider: [facility] office   I discussed the limitations, risks, security and privacy concerns of performing an evaluation and management service by telephone and the availability of in person appointments. I also discussed with the patient that there may be a patient responsible charge related to this service. The patient expressed understanding and agreed to proceed.   History of Present Illness:  the patient has been off of her BP meds for a while due to lack of follow up.  She feels fine.  Wants to get back on meds.  No HA/CP/Dizziness    Observations/Objective:  NAD.  A&Ox3   Assessment and Plan: 1. Essential hypertension Uncontrolled.  Out of meds - amLODipine (NORVASC) 10 MG tablet; TAKE 1 TABLET (10 MG TOTAL) BY MOUTH DAILY. TO LOWER BLOOD PRESSURE  Dispense: 90 tablet; Refill: 1 - labetalol (NORMODYNE) 100 MG tablet; Take 1 tablet (100 mg total) by mouth 2 (two) times daily. To lower blood pressure and heart rate  Dispense: 180 tablet; Refill: 1 - Comprehensive metabolic panel; Future  2. Sinus tachycardia - labetalol (NORMODYNE) 100 MG tablet; Take 1 tablet (100 mg total) by mouth 2 (two) times daily. To lower blood pressure and heart rate  Dispense: 180 tablet; Refill: 1  3. Screening for diabetes mellitus I have had a lengthy discussion and provided education about insulin resistance and the intake of too much sugar/refined carbohydrates.  I have advised the patient to work at a goal of eliminating sugary drinks, candy, desserts, sweets, refined sugars, processed foods, and white carbohydrates.  The patient expresses understanding.   - Hemoglobin A1c; Future  4. Screening cholesterol level - Lipid panel; Future   Follow Up Instructions: Please schedule a lab appt and  an appt with Franky Macho on the same day in about 3 weeks for BP check   I discussed the assessment and treatment plan with the patient. The patient was provided an opportunity to ask questions and all were answered. The patient agreed with the plan and demonstrated an understanding of the instructions.   The patient was advised to call back or seek an in-person evaluation if the symptoms worsen or if the condition fails to improve as anticipated.  I provided 12 minutes of non-face-to-face time during this encounter.   Georgian Co, PA-C  Patient ID: Dana Allison, female   DOB: 06/21/1985, 35 y.o.   MRN: [id number]

## 2021-01-28 ENCOUNTER — Other Ambulatory Visit: Payer: Self-pay

## 2021-02-17 ENCOUNTER — Ambulatory Visit: Payer: Self-pay | Admitting: Pharmacist

## 2021-02-17 ENCOUNTER — Other Ambulatory Visit: Payer: Self-pay

## 2021-02-17 ENCOUNTER — Ambulatory Visit: Payer: PRIVATE HEALTH INSURANCE | Admitting: Pharmacist

## 2021-02-18 LAB — HEMOGLOBIN A1C
Est. average glucose Bld gHb Est-mCnc: 128 mg/dL
Hgb A1c MFr Bld: 6.1 % — ABNORMAL HIGH (ref 4.8–5.6)

## 2021-02-18 LAB — COMPREHENSIVE METABOLIC PANEL
ALT: 45 IU/L — ABNORMAL HIGH (ref 0–32)
AST: 26 IU/L (ref 0–40)
Albumin/Globulin Ratio: 1.4 (ref 1.2–2.2)
Albumin: 4.3 g/dL (ref 3.8–4.8)
Alkaline Phosphatase: 70 IU/L (ref 44–121)
BUN/Creatinine Ratio: 13 (ref 9–23)
BUN: 9 mg/dL (ref 6–20)
Bilirubin Total: 0.3 mg/dL (ref 0.0–1.2)
CO2: 22 mmol/L (ref 20–29)
Calcium: 9.5 mg/dL (ref 8.7–10.2)
Chloride: 102 mmol/L (ref 96–106)
Creatinine, Ser: 0.72 mg/dL (ref 0.57–1.00)
Globulin, Total: 3.1 g/dL (ref 1.5–4.5)
Glucose: 91 mg/dL (ref 65–99)
Potassium: 3.7 mmol/L (ref 3.5–5.2)
Sodium: 140 mmol/L (ref 134–144)
Total Protein: 7.4 g/dL (ref 6.0–8.5)
eGFR: 114 mL/min/{1.73_m2} (ref >59)

## 2021-02-18 LAB — LIPID PANEL
Chol/HDL Ratio: 4.3 ratio (ref 0.0–4.4)
Cholesterol, Total: 173 mg/dL (ref 100–199)
HDL: 40 mg/dL (ref >39)
LDL Chol Calc (NIH): 120 mg/dL — ABNORMAL HIGH (ref 0–99)
Triglycerides: 68 mg/dL (ref 0–149)
VLDL Cholesterol Cal: 13 mg/dL (ref 5–40)

## 2021-02-23 ENCOUNTER — Other Ambulatory Visit (HOSPITAL_COMMUNITY)
Admission: RE | Admit: 2021-02-23 | Discharge: 2021-02-23 | Disposition: A | Discharge status: Home / Self Care | Payer: Medicaid Other | Source: Ambulatory Visit | Attending: Obstetrics & Gynecology | Admitting: Obstetrics & Gynecology

## 2021-02-23 ENCOUNTER — Encounter: Payer: Self-pay | Admitting: Obstetrics & Gynecology

## 2021-02-23 ENCOUNTER — Other Ambulatory Visit: Payer: Self-pay

## 2021-02-23 ENCOUNTER — Ambulatory Visit (INDEPENDENT_AMBULATORY_CARE_PROVIDER_SITE_OTHER): Payer: Medicaid Other | Admitting: Obstetrics & Gynecology

## 2021-02-23 VITALS — BP 142/84 | HR 94 | Wt 296.5 lb

## 2021-02-23 DIAGNOSIS — D069 Carcinoma in situ of cervix, unspecified: Secondary | ICD-10-CM | POA: Insufficient documentation

## 2021-02-23 DIAGNOSIS — Z3202 Encounter for pregnancy test, result negative: Secondary | ICD-10-CM

## 2021-02-23 LAB — POCT PREGNANCY, URINE: Preg Test, Ur: NEGATIVE

## 2021-02-23 NOTE — Progress Notes (Signed)
   GYNECOLOGY OFFICE PROCEDURE NOTE  Dana Allison is a 35 y.o. G0P0000 here for LEEP. No GYN concerns. Pap smear and colposcopy history reviewed.    ASCUS + HRHPV with atypical endocervical cells on pap smear done 11/01/2020 Colpo Biopsy 12/17/2020 ECC : CIN III and atypical endocervical cells. Benign endometrial biopsy  Risks, benefits, alternatives, and limitations of procedure explained to patient, including pain, bleeding, infection, failure to remove abnormal tissue and failure to cure dysplasia, need for repeat procedures, damage to pelvic organs, cervical incompetence.  Role of HPV,cervical dysplasia and need for close followup was empasized. Informed written consent was obtained. All questions were answered. Time out performed. Urine pregnancy test was negative.  ??Procedure: The patient was placed in lithotomy position and the bivalved coated speculum was placed in the patient's vagina. A grounding pad placed on the patient. Lugol's solution was applied to the cervix and a very small area of decreased uptake was noted around the transformation zone.   Local anesthesia was administered via an intracervical block using 10 ml of 2% Lidocaine with epinephrine. The suction was turned on and the Extended Endocervical Medium 1X Fisher Cone Biopsy Excisor on 42 Watts of blended current was used to excise the area of decreased uptake and excise the entire transformation zone. Excellent hemostasis was achieved using roller ball coagulation set at 60 Watts coagulation current. Monsel's solution was then applied and the speculum was removed from the vagina. Specimens were sent to pathology.  ?The patient tolerated the procedure well. Post-operative instructions given to patient, including instruction to seek medical attention for persistent bright red bleeding, fever, abdominal/pelvic pain, dysuria, nausea or vomiting. She was also told about the possibility of having copious yellow to black tinged discharge  for weeks. She was counseled to avoid anything in the vagina (sex/douching/tampons) for 3 weeks. She has a 4 week post-operative check to assess wound healing, review results and discuss further management.    Jaynie Collins, MD, FACOG Obstetrician & Gynecologist, [facility] for [employer], [facility]

## 2021-02-23 NOTE — Patient Instructions (Signed)
LEEP POST-PROCEDURE INSTRUCTIONS ? ?You may take Ibuprofen, Aleve or Tylenol for pain if needed.  Cramping is normal. ? ?You will have black and/or bloody discharge at first.  This will lighten and then turn clear before completely resolving.  This will take 2 to 3 weeks. ? ?Put nothing in your vagina until the bleeding or discharge stops (usually 2 or 3 weeks). ? ?You need to call if you have redness around the biopsy site, if there is any unusual draining, if the bleeding is heavy, or if you are concerned. ? ?Shower or bathe as normal ? ?We will call you within one week with results or we will discuss the results at your follow-up appointment if needed. ? ?You will need to return for a follow-up Pap smear as directed by your physician. ?

## 2021-02-26 ENCOUNTER — Encounter: Payer: Self-pay | Admitting: Radiology

## 2021-03-03 ENCOUNTER — Telehealth: Payer: Self-pay

## 2021-03-03 LAB — SURGICAL PATHOLOGY

## 2021-03-03 NOTE — Telephone Encounter (Signed)
-----   Message from Tereso Newcomer, MD sent at 03/03/2021  4:54 PM EDT ----- Negative margins seen. Need to repeat pap and HPV testing in six months. Please call to inform patient of results and recommendations.

## 2021-03-03 NOTE — Telephone Encounter (Signed)
Call placed to pt. Spoke with pt. Pt given results and recommendations per Dr Macon Large.  Pt verbalized understanding and agreeable to plan of care.  Recall placed for pap in 6 months!  Judeth Cornfield, RN

## 2021-03-05 ENCOUNTER — Encounter (HOSPITAL_COMMUNITY): Payer: Self-pay | Admitting: Obstetrics and Gynecology

## 2021-03-05 ENCOUNTER — Other Ambulatory Visit: Payer: Self-pay

## 2021-03-05 ENCOUNTER — Emergency Department (HOSPITAL_COMMUNITY)
Admission: AD | Admit: 2021-03-05 | Discharge: 2021-03-05 | Disposition: A | Discharge status: Home / Self Care | Payer: PRIVATE HEALTH INSURANCE | Attending: Obstetrics and Gynecology | Admitting: Obstetrics and Gynecology

## 2021-03-05 DIAGNOSIS — N939 Abnormal uterine and vaginal bleeding, unspecified: Secondary | ICD-10-CM | POA: Insufficient documentation

## 2021-03-05 DIAGNOSIS — Z79899 Other long term (current) drug therapy: Secondary | ICD-10-CM | POA: Diagnosis not present

## 2021-03-05 DIAGNOSIS — I10 Essential (primary) hypertension: Secondary | ICD-10-CM | POA: Insufficient documentation

## 2021-03-05 LAB — CBC WITH DIFFERENTIAL/PLATELET
Abs Immature Granulocytes: 0.06 10*3/uL (ref 0.00–0.07)
Basophils Absolute: 0.1 10*3/uL (ref 0.0–0.1)
Basophils Relative: 0 %
Eosinophils Absolute: 0.1 10*3/uL (ref 0.0–0.5)
Eosinophils Relative: 0 %
HCT: 39.9 % (ref 36.0–46.0)
Hemoglobin: 13.1 g/dL (ref 12.0–15.0)
Immature Granulocytes: 0 %
Lymphocytes Relative: 12 %
Lymphs Abs: 2.2 10*3/uL (ref 0.7–4.0)
MCH: 30.5 pg (ref 26.0–34.0)
MCHC: 32.8 g/dL (ref 30.0–36.0)
MCV: 93 fL (ref 80.0–100.0)
Monocytes Absolute: 0.9 10*3/uL (ref 0.1–1.0)
Monocytes Relative: 5 %
Neutro Abs: 15.5 10*3/uL — ABNORMAL HIGH (ref 1.7–7.7)
Neutrophils Relative %: 83 %
Platelets: 290 10*3/uL (ref 150–400)
RBC: 4.29 MIL/uL (ref 3.87–5.11)
RDW: 12.1 % (ref 11.5–15.5)
WBC: 18.8 10*3/uL — ABNORMAL HIGH (ref 4.0–10.5)
nRBC: 0 % (ref 0.0–0.2)

## 2021-03-05 LAB — URINALYSIS, ROUTINE W REFLEX MICROSCOPIC
Bilirubin Urine: NEGATIVE
Glucose, UA: NEGATIVE mg/dL
Ketones, ur: NEGATIVE mg/dL
Nitrite: POSITIVE — AB
Protein, ur: 100 mg/dL — AB
RBC / HPF: >50 RBC/hpf — ABNORMAL HIGH (ref 0–5)
Specific Gravity, Urine: 1.02 (ref 1.005–1.030)
pH: 6 (ref 5.0–8.0)

## 2021-03-05 LAB — COMPREHENSIVE METABOLIC PANEL
ALT: 56 U/L — ABNORMAL HIGH (ref 0–44)
AST: 40 U/L (ref 15–41)
Albumin: 3.8 g/dL (ref 3.5–5.0)
Alkaline Phosphatase: 68 U/L (ref 38–126)
Anion gap: 6 (ref 5–15)
BUN: 5 mg/dL — ABNORMAL LOW (ref 6–20)
CO2: 28 mmol/L (ref 22–32)
Calcium: 9.2 mg/dL (ref 8.9–10.3)
Chloride: 104 mmol/L (ref 98–111)
Creatinine, Ser: 0.82 mg/dL (ref 0.44–1.00)
GFR, Estimated: >60 mL/min (ref >60)
Glucose, Bld: 118 mg/dL — ABNORMAL HIGH (ref 70–99)
Potassium: 3.7 mmol/L (ref 3.5–5.1)
Sodium: 138 mmol/L (ref 135–145)
Total Bilirubin: 0.7 mg/dL (ref 0.3–1.2)
Total Protein: 7.7 g/dL (ref 6.5–8.1)

## 2021-03-05 LAB — LIPASE, BLOOD: Lipase: 22 U/L (ref 11–51)

## 2021-03-05 LAB — POCT PREGNANCY, URINE: Preg Test, Ur: NEGATIVE

## 2021-03-05 MED ORDER — HYDROCODONE-ACETAMINOPHEN 5-325 MG PO TABS
1.0000 | ORAL_TABLET | Freq: Once | ORAL | Status: AC
  Administered 2021-03-05: 1 via ORAL
  Filled 2021-03-05: qty 1

## 2021-03-05 MED ORDER — HYDROCODONE-ACETAMINOPHEN 5-325 MG PO TABS
1.0000 | ORAL_TABLET | Freq: Four times a day (QID) | ORAL | 0 refills | Status: AC | PRN
Start: 2021-03-05 — End: 2021-03-08

## 2021-03-05 MED ORDER — CEPHALEXIN 500 MG PO CAPS: 500.0000 mg | ORAL_CAPSULE | Freq: Four times a day (QID) | ORAL | 0 refills | Status: DC

## 2021-03-05 NOTE — ED Provider Notes (Signed)
Emergency Medicine Provider Triage Evaluation Note  Dana Allison , a 35 y.o. female  was evaluated in triage.  Pt complains of vaginal bleeding, vaginal pain.  LEEP procedure performed 06/06/2026.  Reports 2 pads an hour for the last couple of days.  Also increasing pain without any relief with ibuprofen.  Evaluated at [facility] but sent here for further evaluation.  Review of Systems  Positive: Vaginal bleeding, vaginal bleeding, abdominal pain Negative: Fever,chest pain, shortness of breath  Physical Exam  BP (!) 152/118 (BP Location: Left Arm)   Pulse 88   Temp 99 F (37.2 C) (Oral)   Resp 17   SpO2 100%  Gen:   Awake, no distress   Resp:  Normal effort  MSK:   Moves extremities without difficulty  Other:    Medical Decision Making  Medically screening exam initiated at 12:29 PM.  Appropriate orders placed.  Dana Allison was informed that the remainder of the evaluation will be completed by another provider, this initial triage assessment does not replace that evaluation, and the importance of remaining in the ED until their evaluation is complete.     Dana Manges, PA-C 03/05/21 1230    Dana Munch, MD 03/05/21 1606

## 2021-03-05 NOTE — [facility] Note (Signed)
Presents stating she s/p LEEP procedure on 02/23/2021.  Reports today she's having vaginal and abdominal pain.  Also reports VB, states had some spotting initially but now bleeding heavier.  States saturating and changing a pad/hour.

## 2021-03-05 NOTE — ED Provider Notes (Signed)
[facility] EMERGENCY DEPARTMENT Provider Note   CSN: [id number] Arrival date & time: 03/05/21  8315     History Chief Complaint  Patient presents with   Vaginal Bleeding    Dana Allison is a 35 y.o. female.  HPI This is a 35 year old female with history of hypertension, obesity, prediabetes who presents with vaginal bleeding.  Patient had a LEEP procedure done on 9/28 of this year and has had spotting since then.  Today she developed large-volume vaginal bleeding described as dark blood with several large clots.  Patient has gone through 2 pads    Past Medical History:  Diagnosis Date   Endometrial polyp 03/17/2019   Hypertension    Morbid obesity (HCC)    Pre-diabetes    per patient    Patient Active Problem List   Diagnosis Date Noted   Severe dysplasia of cervix (CIN III) 12/20/2020   LGSIL on Pap smear of cervix 11/01/2020   Morbid obesity (HCC)    Hypertension    Pre-diabetes    DUB (dysfunctional uterine bleeding) 09/19/2018   OBESITY, NOS 07/26/2006    Past Surgical History:  Procedure Laterality Date   HYSTEROSCOPY WITH D & C N/A 04/08/2019   Procedure: DILATATION AND CURETTAGE /HYSTEROSCOPY WITH ENDOMETRIAL POLYPECTOMY;  Surgeon: Hermina Staggers, MD;  Location: [facility] OR;  Service: Gynecology;  Laterality: N/A;   NO PAST SURGERIES       OB History     Gravida  0   Para  0   Term  0   Preterm  0   AB  0   Living  0      SAB  0   IAB  0   Ectopic  0   Multiple  0   Live Births  0           Family History  Problem Relation Age of Onset   Hypertension Mother    Diabetes Mother    Diabetes Father    Hypertension Sister    Hypertension Sister     Social History   Tobacco Use   Smoking status: Never   Smokeless tobacco: Never  Vaping Use   Vaping Use: Never used  Substance Use Topics   Alcohol use: Yes    Comment: occasionally   Drug use: No    Home Medications Prior to Admission medications    Medication Sig Start Date End Date Taking? Authorizing Provider  amLODipine (NORVASC) 10 MG tablet TAKE 1 TABLET (10 MG TOTAL) BY MOUTH DAILY. TO LOWER BLOOD PRESSURE 01/27/21 01/27/22 Yes McClung, Marzella Schlein, PA-C  cephALEXin (KEFLEX) 500 MG capsule Take 1 capsule (500 mg total) by mouth 4 (four) times daily. 03/05/21  Yes Doran Clay, MD  HYDROcodone-acetaminophen (NORCO/VICODIN) 5-325 MG tablet Take 1 tablet by mouth every 6 (six) hours as needed for up to 3 days for severe pain. 03/05/21 03/08/21 Yes Doran Clay, MD  ibuprofen (ADVIL) 800 MG tablet Take 1 tablet (800 mg total) by mouth every 8 (eight) hours as needed. 09/02/19  Yes Gerrit Heck, CNM  labetalol (NORMODYNE) 100 MG tablet Take 1 tablet (100 mg total) by mouth 2 (two) times daily. To lower blood pressure and heart rate 01/27/21 04/27/21 Yes McClung, Marzella Schlein, PA-C  levonorgestrel (MIRENA) 20 MCG/24HR IUD 1 each by Intrauterine route once. Implanted March 2021    [provider]    Allergies    Patient has no known allergies.  Review of Systems  Review of Systems  Constitutional:  Negative for chills and fever.  HENT:  Negative for ear pain and sore throat.   Eyes:  Negative for pain and visual disturbance.  Respiratory:  Negative for cough and shortness of breath.   Cardiovascular:  Negative for chest pain and palpitations.  Gastrointestinal:  Positive for abdominal pain. Negative for diarrhea, nausea and vomiting.  Genitourinary:  Positive for vaginal bleeding and vaginal pain. Negative for dysuria, flank pain, hematuria and vaginal discharge.  Musculoskeletal:  Negative for arthralgias and back pain.  Skin:  Negative for color change and rash.  Neurological:  Negative for seizures and syncope.  All other systems reviewed and are negative.  Physical Exam Updated Vital Signs BP (!) 140/96 (BP Location: Right Wrist)   Pulse (!) 103   Temp 98.7 F (37.1 C) (Oral)   Resp 16   SpO2 100%   Physical  Exam Vitals and nursing note reviewed. Exam conducted with a chaperone present.  Constitutional:      General: She is not in acute distress.    Appearance: She is well-developed.  HENT:     Head: Normocephalic and atraumatic.  Eyes:     Conjunctiva/sclera: Conjunctivae normal.  Cardiovascular:     Rate and Rhythm: Normal rate and regular rhythm.     Heart sounds: No murmur heard. Pulmonary:     Effort: Pulmonary effort is normal. No respiratory distress.     Breath sounds: Normal breath sounds.  Abdominal:     General: There is no distension.     Palpations: Abdomen is soft.     Tenderness: There is abdominal tenderness. There is no right CVA tenderness, left CVA tenderness, guarding or rebound.  Genitourinary:    Comments: Large clots in vaginal vault. Cervix visualized. Unable to visualize IUD strings. Cervix without active bleeding.  Musculoskeletal:     Cervical back: Neck supple.  Skin:    General: Skin is warm and dry.  Neurological:     Mental Status: She is alert.    ED Results / Procedures / Treatments   Labs (all labs ordered are listed, but only abnormal results are displayed) Labs Reviewed  CBC WITH DIFFERENTIAL/PLATELET - Abnormal; Notable for the following components:      Result Value   WBC 18.8 (*)    Neutro Abs 15.5 (*)    All other components within normal limits  COMPREHENSIVE METABOLIC PANEL - Abnormal; Notable for the following components:   Glucose, Bld 118 (*)    BUN 5 (*)    ALT 56 (*)    All other components within normal limits  URINALYSIS, ROUTINE W REFLEX MICROSCOPIC - Abnormal; Notable for the following components:   Color, Urine AMBER (*)    APPearance CLOUDY (*)    Hgb urine dipstick LARGE (*)    Protein, ur 100 (*)    Nitrite POSITIVE (*)    Leukocytes,Ua MODERATE (*)    RBC / HPF >50 (*)    Bacteria, UA FEW (*)    All other components within normal limits  LIPASE, BLOOD  POCT PREGNANCY, URINE    EKG None  Radiology No  results found.  Procedures Procedures   Medications Ordered in ED Medications  HYDROcodone-acetaminophen (NORCO/VICODIN) 5-325 MG per tablet 1 tablet (1 tablet Oral Given 03/05/21 1604)    ED Course  I have reviewed the triage vital signs and the nursing notes.  Pertinent labs & imaging results that were available during my care of the patient were  reviewed by me and considered in my medical decision making (see chart for details).    MDM Rules/Calculators/A&P                          Patient is hemodynamically stable and well-appearing on exam.  Hemoglobin is normal at 13 there is no significant tachycardia no hypotension.  No dizziness or lightheadedness to indicate significant blood loss.  Abdomen is soft with mild tenderness suprapubically but without rebound or guarding.  Pregnancy test is negative.  Vaginal bleeding likely secondary to recent LEEP procedure.  Pelvic exam without active bleeding or evidence of new cervical or vaginal injury.  No bleeding coming from the uterus out of the cervix.  Unable to visualize IUD strings, but patient states this was the case prior to her procedure as well.  Abdomen is soft and no evidence of perforation secondary to IUD misplacement.   Spoke with gynecology who saw patient in their clinic today.  They see no efficacy in Provera as bleeding likely not from the uterus.  She will follow-up in their clinic early next week.  Patient stable for discharge home.  Given strict return precautions including ongoing or increased bleeding, lightheadedness, dizziness, chest pain, shortness of breath, or loss of consciousness.  She expressed understanding.  Discharged home with Norco for pain. Urine with evidence of UTI likely contirbutibing to suprapubic pain. Also given keflex for 5 days.   Final Clinical Impression(s) / ED Diagnoses Final diagnoses:  Vaginal bleeding    Rx / DC Orders ED Discharge Orders          Ordered    HYDROcodone-acetaminophen  (NORCO/VICODIN) 5-325 MG tablet  Every 6 hours PRN        03/05/21 1759    cephALEXin (KEFLEX) 500 MG capsule  4 times daily        03/05/21 1759             Doran Clay, MD 03/05/21 2356    Jacalyn Lefevre, MD 03/06/21 1606

## 2021-03-05 NOTE — ED Triage Notes (Signed)
Pt reports vaginal pain and heavy vaginal bleeding that started during the night.  Seen at MAU and sent to ED.  LEEP procedure on 9/28.  Has IUD.

## 2021-03-05 NOTE — Discharge Instructions (Addendum)
Take Keflex 4 times daily for 5 days for UTI.  Take Norco every 6 hours as needed for severe pain.  You can also take Motrin for pain every 6 hours.  Follow-up with your OB/GYN, they will call to schedule this appointment on Monday.  Return to the ED with worsening bleeding, dizziness, chest pain, or shortness of breath.

## 2021-03-05 NOTE — Progress Notes (Signed)
MSE completed by Lilyan Punt, NP.

## 2021-03-05 NOTE — MAU Provider Note (Signed)
  History     CSN: [id number]  Arrival date and time: 03/05/21   Seen by provider at 10:15  CC: Abdominal pain and vaginal bleeding  HPI Dana Allison 35 y.o. Client has a negative pregnancy test today.  She has had a Mirena IUD inserted to control heavy vaginal bleeding and it has worked well until today.  Had a LEEP on 02-23-21 and had not been having much bleeding since the LEEP.  Having heavy vaginal bleeding and abdominal cramping that started during the night.  Took an Ibuprofen at 7 am which has not helped her pain.      Past Medical History:  Diagnosis Date   Endometrial polyp 03/17/2019   Hypertension    Morbid obesity (HCC)    Pre-diabetes    per patient    Past Surgical History:  Procedure Laterality Date   HYSTEROSCOPY WITH D & C N/A 04/08/2019   Procedure: DILATATION AND CURETTAGE /HYSTEROSCOPY WITH ENDOMETRIAL POLYPECTOMY;  Surgeon: Hermina Staggers, MD;  Location: [facility] OR;  Service: Gynecology;  Laterality: N/A;   NO PAST SURGERIES      Family History  Problem Relation Age of Onset   Hypertension Mother    Diabetes Mother    Diabetes Father    Hypertension Sister    Hypertension Sister     Social History   Tobacco Use   Smoking status: Never   Smokeless tobacco: Never  Vaping Use   Vaping Use: Never used  Substance Use Topics   Alcohol use: Yes    Comment: occasionally   Drug use: No    Allergies: No Known Allergies  Medications Prior to Admission  Medication Sig Dispense Refill Last Dose   amLODipine (NORVASC) 10 MG tablet TAKE 1 TABLET (10 MG TOTAL) BY MOUTH DAILY. TO LOWER BLOOD PRESSURE 90 tablet 1    ibuprofen (ADVIL) 800 MG tablet Take 1 tablet (800 mg total) by mouth every 8 (eight) hours as needed. 45 tablet 1    labetalol (NORMODYNE) 100 MG tablet Take 1 tablet (100 mg total) by mouth 2 (two) times daily. To lower blood pressure and heart rate 180 tablet 1    levonorgestrel (MIRENA) 20 MCG/24HR IUD 1 each by Intrauterine route once.  Implanted March 2021       Review of Systems  Constitutional:  Negative for fever.  Gastrointestinal:  Positive for abdominal pain.  Genitourinary:  Positive for vaginal bleeding.  Physical Exam   Blood pressure (!) 143/94, pulse 93, temperature 98.3 F (36.8 C), temperature source Oral, resp. rate 19, SpO2 98 %.  Physical Exam GENERAL: Well-developed, well-nourished female appearing to be in pain - walks slowly.  Has bed pad she has been sitting on and a small amount of blood noted on pad.  Reports pain and abnormal bleeding and is seeking relief. HEENT: Normocephalic, atraumatic.  LUNGS: Effort normal  HEART: Regular rate  SKIN: Warm, dry and without erythema    MAU Course  Procedures  MDM ER MED - Dr. Karene Fry - notified by phone and patient transferred to ER.  Assessment and Plan  Not Pregnant Gyn patient needing further evaluation for vaginal bleeding and abdominal pain.  Plan Transfer to ER  [name] 03/05/2021, 10:19 AM

## 2021-03-05 NOTE — Progress Notes (Signed)
Attempted to call ED Charge RN x2 to give report regarding pt transfer to ED, but no answer.

## 2021-03-09 ENCOUNTER — Other Ambulatory Visit: Payer: Self-pay

## 2021-03-10 ENCOUNTER — Other Ambulatory Visit: Payer: Self-pay

## 2021-03-17 ENCOUNTER — Encounter: Payer: Self-pay | Admitting: *Deleted

## 2021-03-28 ENCOUNTER — Ambulatory Visit (INDEPENDENT_AMBULATORY_CARE_PROVIDER_SITE_OTHER): Payer: Medicaid Other | Admitting: Obstetrics and Gynecology

## 2021-03-28 ENCOUNTER — Other Ambulatory Visit: Payer: Self-pay

## 2021-03-28 ENCOUNTER — Encounter: Payer: Self-pay | Admitting: Obstetrics and Gynecology

## 2021-03-28 VITALS — BP 151/93 | HR 100 | Wt 300.0 lb

## 2021-03-28 DIAGNOSIS — T8332XA Displacement of intrauterine contraceptive device, initial encounter: Secondary | ICD-10-CM | POA: Diagnosis not present

## 2021-03-28 DIAGNOSIS — Z30432 Encounter for removal of intrauterine contraceptive device: Secondary | ICD-10-CM | POA: Diagnosis not present

## 2021-03-28 DIAGNOSIS — D069 Carcinoma in situ of cervix, unspecified: Secondary | ICD-10-CM

## 2021-03-28 NOTE — Progress Notes (Signed)
Patient stated that she has been having on and off heavy bleeding since LEEP procedure (01/2821) and went to the ER (03/05/21) due to her having "clots the size of her hand" bleeding

## 2021-03-28 NOTE — Progress Notes (Signed)
  CC: LEEP Subjective:    Patient ID: Dana Allison, female    DOB: 03/19/1986, 35 y.o.   MRN: [id number]  HPI Pt seen for LEEP follow up.  She did have incident of heavier bleeding  03/05/21.  Pt has noted some increased abdominal cramping.  Pathology reviewed of CIN 3 with clear margins.  Malpositioned IUD noted in cervix and was removed with verbal consent.   Review of Systems     Objective:   Physical Exam Vitals:   03/28/21 1550 03/28/21 1615  BP: (!) 150/93 (!) 151/93  Pulse: 100 100  SSE:  LEEP bed well healed, no granulation tissue.  IUD strings seen; however, post of Liletta noted at cervical os.         Assessment & Plan:   1. Severe dysplasia of cervix (CIN III) Per previous note, repap in 6 months  2. Malpositioned intrauterine device (IUD), initial encounter Removed, see separate note  If bleeding returns can consider placement of new IUD after repap or possibly using megace or lysteda to help control bleeding amount and duration.  Warden Fillers, MD Faculty Attending, [facility]

## 2021-03-28 NOTE — Progress Notes (Signed)
    GYNECOLOGY OFFICE PROCEDURE NOTE  Dana Allison is a 35 y.o. G0P0000 here for  IUD removal after IUD malposition noted. No GYN concerns.  IUD Removal  Patient identified, informed verbal consent performed, consent signed after procedure.  Patient was in the dorsal lithotomy position, normal external genitalia was noted.  A speculum was placed in the patient's vagina, normal discharge was noted, no lesions. The cervix was visualized, no lesions, no abnormal discharge.  The strings of the IUD were grasped and pulled using ring forceps. The IUD was removed in its entirety.   Patient tolerated the procedure well.    Patient is unsure about contraception as she was using the IUD to help control bleeding  Routine preventative health maintenance measures emphasized.   Dana Aloe, MD, FACOG Obstetrician & Gynecologist, [facility], [facility]

## 2021-05-04 ENCOUNTER — Other Ambulatory Visit: Payer: Self-pay

## 2021-05-04 ENCOUNTER — Encounter: Payer: Self-pay | Admitting: Obstetrics & Gynecology

## 2021-05-04 DIAGNOSIS — N938 Other specified abnormal uterine and vaginal bleeding: Secondary | ICD-10-CM

## 2021-05-04 MED ORDER — MEGESTROL ACETATE 40 MG PO TABS
ORAL_TABLET | ORAL | 3 refills | Status: AC
  Filled 2021-05-04: qty 60, 30d supply, fill #0

## 2021-05-04 NOTE — Progress Notes (Signed)
Megace orders placed per Donavan Foil, MD.

## 2021-05-05 ENCOUNTER — Other Ambulatory Visit: Payer: Self-pay

## 2021-05-31 ENCOUNTER — Emergency Department (HOSPITAL_COMMUNITY)
Admission: EM | Admit: 2021-05-31 | Discharge: 2021-05-31 | Disposition: A | Discharge status: Home / Self Care | Payer: PRIVATE HEALTH INSURANCE | Attending: Emergency Medicine | Admitting: Emergency Medicine

## 2021-05-31 ENCOUNTER — Other Ambulatory Visit: Payer: Self-pay

## 2021-05-31 ENCOUNTER — Encounter (HOSPITAL_COMMUNITY): Payer: Self-pay

## 2021-05-31 DIAGNOSIS — R102 Pelvic and perineal pain: Secondary | ICD-10-CM | POA: Insufficient documentation

## 2021-05-31 DIAGNOSIS — Z5321 Procedure and treatment not carried out due to patient leaving prior to being seen by health care provider: Secondary | ICD-10-CM | POA: Insufficient documentation

## 2021-05-31 LAB — URINALYSIS, MICROSCOPIC (REFLEX)

## 2021-05-31 LAB — URINALYSIS, ROUTINE W REFLEX MICROSCOPIC
Bilirubin Urine: NEGATIVE
Glucose, UA: NEGATIVE mg/dL
Ketones, ur: NEGATIVE mg/dL
Nitrite: NEGATIVE
Protein, ur: NEGATIVE mg/dL
Specific Gravity, Urine: >1.03 — ABNORMAL HIGH (ref 1.005–1.030)
pH: 6 (ref 5.0–8.0)

## 2021-05-31 LAB — I-STAT BETA HCG BLOOD, ED (MC, WL, AP ONLY): I-stat hCG, quantitative: <5 m[IU]/mL (ref <5)

## 2021-05-31 MED ORDER — ACETAMINOPHEN 325 MG PO TABS
650.0000 mg | ORAL_TABLET | Freq: Once | ORAL | Status: AC
  Administered 2021-05-31: 650 mg via ORAL
  Filled 2021-05-31: qty 2

## 2021-05-31 NOTE — ED Notes (Signed)
Pt states wait is too long and she is leaving

## 2021-05-31 NOTE — ED Provider Notes (Signed)
Emergency Medicine Provider Triage Evaluation Note  Dana Allison , a 36 y.o. female  was evaluated in triage.  Pt complains of lower abdominal pelvic pain that began today.  She had intercourse yesterday.  She denies any discharge, foul odor, urinary symptoms.  Review of Systems  Positive: Pelvic pain Negative: Vaginal discharge or dysuria  Physical Exam  BP (!) 171/121    Pulse 91    Temp 99.1 F (37.3 C) (Oral)    Resp 16    SpO2 99%  Gen:   Awake, no distress   Resp:  Normal effort  MSK:   Moves extremities without difficulty  Other:  Suprapubic tenderness on exam  Medical Decision Making  Medically screening exam initiated at 5:32 PM.  Appropriate orders placed.  Amiriah Weniger was informed that the remainder of the evaluation will be completed by another provider, this initial triage assessment does not replace that evaluation, and the importance of remaining in the ED until their evaluation is complete.  Work-up initiated   Margarita Mail, Hershal Coria 05/31/21 2158    Teressa Lower, MD 06/01/21 [phone]

## 2021-05-31 NOTE — ED Triage Notes (Signed)
Patient complains of lower abdominal /pelvic pain that started today. Denies dysuria, denies discharge. NAD

## 2021-06-01 ENCOUNTER — Encounter (HOSPITAL_COMMUNITY): Payer: Self-pay | Admitting: Emergency Medicine

## 2021-06-01 ENCOUNTER — Telehealth: Payer: Self-pay | Admitting: Family Medicine

## 2021-06-01 ENCOUNTER — Emergency Department (HOSPITAL_COMMUNITY)
Admission: EM | Admit: 2021-06-01 | Discharge: 2021-06-02 | Disposition: A | Discharge status: Home / Self Care | Payer: Medicaid Other | Attending: Emergency Medicine | Admitting: Emergency Medicine

## 2021-06-01 DIAGNOSIS — N730 Acute parametritis and pelvic cellulitis: Secondary | ICD-10-CM

## 2021-06-01 DIAGNOSIS — Z79899 Other long term (current) drug therapy: Secondary | ICD-10-CM | POA: Insufficient documentation

## 2021-06-01 DIAGNOSIS — N739 Female pelvic inflammatory disease, unspecified: Secondary | ICD-10-CM | POA: Insufficient documentation

## 2021-06-01 DIAGNOSIS — I10 Essential (primary) hypertension: Secondary | ICD-10-CM | POA: Insufficient documentation

## 2021-06-01 LAB — COMPREHENSIVE METABOLIC PANEL
ALT: 28 U/L (ref 0–44)
AST: 21 U/L (ref 15–41)
Albumin: 4.2 g/dL (ref 3.5–5.0)
Alkaline Phosphatase: 56 U/L (ref 38–126)
Anion gap: 7 (ref 5–15)
BUN: 7 mg/dL (ref 6–20)
CO2: 25 mmol/L (ref 22–32)
Calcium: 9.1 mg/dL (ref 8.9–10.3)
Chloride: 106 mmol/L (ref 98–111)
Creatinine, Ser: 0.71 mg/dL (ref 0.44–1.00)
GFR, Estimated: >60 mL/min (ref >60)
Glucose, Bld: 89 mg/dL (ref 70–99)
Potassium: 3.4 mmol/L — ABNORMAL LOW (ref 3.5–5.1)
Sodium: 138 mmol/L (ref 135–145)
Total Bilirubin: 0.9 mg/dL (ref 0.3–1.2)
Total Protein: 8.3 g/dL — ABNORMAL HIGH (ref 6.5–8.1)

## 2021-06-01 LAB — I-STAT BETA HCG BLOOD, ED (MC, WL, AP ONLY): I-stat hCG, quantitative: <5 m[IU]/mL (ref <5)

## 2021-06-01 LAB — LIPASE, BLOOD: Lipase: 66 U/L — ABNORMAL HIGH (ref 11–51)

## 2021-06-01 LAB — CBC WITH DIFFERENTIAL/PLATELET
Abs Immature Granulocytes: 0.04 10*3/uL (ref 0.00–0.07)
Basophils Absolute: 0 10*3/uL (ref 0.0–0.1)
Basophils Relative: 0 %
Eosinophils Absolute: 0.1 10*3/uL (ref 0.0–0.5)
Eosinophils Relative: 1 %
HCT: 39.9 % (ref 36.0–46.0)
Hemoglobin: 13.6 g/dL (ref 12.0–15.0)
Immature Granulocytes: 0 %
Lymphocytes Relative: 25 %
Lymphs Abs: 3.2 10*3/uL (ref 0.7–4.0)
MCH: 31.6 pg (ref 26.0–34.0)
MCHC: 34.1 g/dL (ref 30.0–36.0)
MCV: 92.6 fL (ref 80.0–100.0)
Monocytes Absolute: 0.8 10*3/uL (ref 0.1–1.0)
Monocytes Relative: 6 %
Neutro Abs: 8.5 10*3/uL — ABNORMAL HIGH (ref 1.7–7.7)
Neutrophils Relative %: 68 %
Platelets: 241 10*3/uL (ref 150–400)
RBC: 4.31 MIL/uL (ref 3.87–5.11)
RDW: 12.4 % (ref 11.5–15.5)
WBC: 12.7 10*3/uL — ABNORMAL HIGH (ref 4.0–10.5)
nRBC: 0 % (ref 0.0–0.2)

## 2021-06-01 LAB — URINALYSIS, ROUTINE W REFLEX MICROSCOPIC
Bacteria, UA: NONE SEEN
Bilirubin Urine: NEGATIVE
Glucose, UA: NEGATIVE mg/dL
Hgb urine dipstick: NEGATIVE
Ketones, ur: 5 mg/dL — AB
Nitrite: NEGATIVE
Protein, ur: 30 mg/dL — AB
Specific Gravity, Urine: 1.027 (ref 1.005–1.030)
pH: 5 (ref 5.0–8.0)

## 2021-06-01 LAB — WET PREP, GENITAL
Sperm: NONE SEEN
Trich, Wet Prep: NONE SEEN
WBC, Wet Prep HPF POC: >=10 — AB (ref <10)
Yeast Wet Prep HPF POC: NONE SEEN

## 2021-06-01 MED ORDER — CEFTRIAXONE SODIUM 1 G IJ SOLR
1.0000 g | Freq: Once | INTRAMUSCULAR | Status: AC
  Administered 2021-06-01: 1 g via INTRAMUSCULAR
  Filled 2021-06-01: qty 10

## 2021-06-01 MED ORDER — SODIUM CHLORIDE 0.9 % IV SOLN: INTRAVENOUS | Status: DC

## 2021-06-01 MED ORDER — LIDOCAINE HCL (PF) 1 % IJ SOLN
2.1000 mL | Freq: Once | INTRAMUSCULAR | Status: AC
  Administered 2021-06-01: 2.1 mL
  Filled 2021-06-01: qty 30

## 2021-06-01 MED ORDER — LABETALOL HCL 5 MG/ML IV SOLN
20.0000 mg | Freq: Once | INTRAVENOUS | Status: AC
  Administered 2021-06-01: 20 mg via INTRAVENOUS
  Filled 2021-06-01 (×2): qty 4

## 2021-06-01 MED ORDER — SODIUM CHLORIDE 0.9 % IV BOLUS
1000.0000 mL | Freq: Once | INTRAVENOUS | Status: AC
  Administered 2021-06-01: 1000 mL via INTRAVENOUS

## 2021-06-01 MED ORDER — DOXYCYCLINE HYCLATE 100 MG PO TABS
100.0000 mg | ORAL_TABLET | Freq: Two times a day (BID) | ORAL | 0 refills | Status: AC
  Filled 2021-06-01 – 2021-06-03 (×3): qty 14, 7d supply, fill #0

## 2021-06-01 MED ORDER — LABETALOL HCL 100 MG PO TABS
100.0000 mg | ORAL_TABLET | Freq: Once | ORAL | Status: AC
  Administered 2021-06-01: 100 mg via ORAL
  Filled 2021-06-01: qty 1

## 2021-06-01 MED ORDER — ONDANSETRON HCL 4 MG/2ML IJ SOLN
4.0000 mg | Freq: Once | INTRAMUSCULAR | Status: AC
  Administered 2021-06-01: 4 mg via INTRAVENOUS
  Filled 2021-06-01: qty 2

## 2021-06-01 MED ORDER — MORPHINE SULFATE (PF) 4 MG/ML IV SOLN
4.0000 mg | Freq: Once | INTRAVENOUS | Status: AC
  Administered 2021-06-01: 4 mg via INTRAVENOUS
  Filled 2021-06-01: qty 1

## 2021-06-01 MED ORDER — AMLODIPINE BESYLATE 5 MG PO TABS
10.0000 mg | ORAL_TABLET | Freq: Once | ORAL | Status: AC
  Administered 2021-06-01: 10 mg via ORAL
  Filled 2021-06-01: qty 2

## 2021-06-01 MED ORDER — NAPROXEN 375 MG PO TABS
375.0000 mg | ORAL_TABLET | Freq: Two times a day (BID) | ORAL | 0 refills | Status: DC
  Filled 2021-06-01 – 2021-06-02 (×2): qty 20, 10d supply, fill #0

## 2021-06-01 NOTE — ED Provider Notes (Signed)
[facility]-EMERGENCY DEPT Provider Note   CSN: [id number] Arrival date & time: 06/01/21  1012     History  Chief Complaint  Patient presents with   Pelvic Pain    Dana Allison is a 36 y.o. female.   Pelvic Pain   Patient presented to the ED for evaluation of pelvic pain.  Patient states she has been having symptoms since yesterday.  She is having cramping pain in her lower pelvic region and radiates to the side bilaterally.  She has had some vaginal discharge.  No vaginal bleeding.  No vomiting or diarrhea.  No nausea.  Patient does have a history of hypertension.  She has not been able to take occasions today because she has been waiting in the ED for long period of time  Home Medications Prior to Admission medications   Medication Sig Start Date End Date Taking? Authorizing Provider  doxycycline (VIBRA-TABS) 100 MG tablet Take 1 tablet (100 mg total) by mouth 2 (two) times daily for 7 days. 06/01/21 06/08/21 Yes Linwood Dibbles, MD  megestrol (MEGACE) 40 MG tablet Take 1 tablet (40 mg total) by mouth 2 (two) times daily for 30 days, THEN 1 tablet (40 mg total) daily. 05/04/21 09/01/21 Yes Warden Fillers, MD  naproxen (NAPROSYN) 375 MG tablet Take 1 tablet (375 mg total) by mouth 2 (two) times daily. 06/01/21  Yes Linwood Dibbles, MD  amLODipine (NORVASC) 10 MG tablet TAKE 1 TABLET (10 MG TOTAL) BY MOUTH DAILY. TO LOWER BLOOD PRESSURE Patient not taking: Reported on 06/01/2021 01/27/21 01/27/22  Anders Simmonds, PA-C  cephALEXin (KEFLEX) 500 MG capsule Take 1 capsule (500 mg total) by mouth 4 (four) times daily. Patient not taking: Reported on 06/01/2021 03/05/21   Doran Clay, MD  labetalol (NORMODYNE) 100 MG tablet Take 1 tablet (100 mg total) by mouth 2 (two) times daily. To lower blood pressure and heart rate Patient not taking: Reported on 06/01/2021 01/27/21 08/06/21  Anders Simmonds, PA-C      Allergies    Patient has no known allergies.    Review of Systems   Review  of Systems  Genitourinary:  Positive for pelvic pain.  All other systems reviewed and are negative.  Physical Exam Updated Vital Signs BP (!) 193/128    Pulse (!) 102    Temp 99.3 F (37.4 C) (Oral)    Resp 18    SpO2 98%  Physical Exam Vitals and nursing note reviewed. Exam conducted with a chaperone present.  Constitutional:      Appearance: She is well-developed. She is not diaphoretic.  HENT:     Head: Normocephalic and atraumatic.     Right Ear: External ear normal.     Left Ear: External ear normal.  Eyes:     General: No scleral icterus.       Right eye: No discharge.        Left eye: No discharge.     Conjunctiva/sclera: Conjunctivae normal.  Neck:     Trachea: No tracheal deviation.  Cardiovascular:     Rate and Rhythm: Normal rate and regular rhythm.  Pulmonary:     Effort: Pulmonary effort is normal. No respiratory distress.     Breath sounds: Normal breath sounds. No stridor. No wheezing or rales.  Abdominal:     General: Bowel sounds are normal. There is no distension.     Palpations: Abdomen is soft.     Tenderness: There is abdominal tenderness in the epigastric  area and periumbilical area. There is no guarding or rebound.     Comments: Tenderness palpation suprapubic region  Genitourinary:    General: Normal vulva.     Pubic Area: No rash.      Vagina: Vaginal discharge present.     Cervix: Cervical motion tenderness and discharge present.     Uterus: Tender.      Adnexa:        Right: No mass.         Left: No mass.    Musculoskeletal:        General: No tenderness or deformity.     Cervical back: Neck supple.  Skin:    General: Skin is warm and dry.     Findings: No rash.  Neurological:     General: No focal deficit present.     Cranial Nerves: No cranial nerve deficit (no facial droop, extraocular movements intact, no slurred speech).     Sensory: No sensory deficit.     Motor: No abnormal muscle tone or seizure activity.     Coordination:  Coordination normal.  Psychiatric:        Mood and Affect: Mood normal.    ED Results / Procedures / Treatments   Labs (all labs ordered are listed, but only abnormal results are displayed) Labs Reviewed  WET PREP, GENITAL - Abnormal; Notable for the following components:      Result Value   Clue Cells Wet Prep HPF POC PRESENT (*)    WBC, Wet Prep HPF POC >=10 (*)    All other components within normal limits  URINALYSIS, ROUTINE W REFLEX MICROSCOPIC - Abnormal; Notable for the following components:   APPearance HAZY (*)    Ketones, ur 5 (*)    Protein, ur 30 (*)    Leukocytes,Ua LARGE (*)    All other components within normal limits  COMPREHENSIVE METABOLIC PANEL - Abnormal; Notable for the following components:   Potassium 3.4 (*)    Total Protein 8.3 (*)    All other components within normal limits  LIPASE, BLOOD - Abnormal; Notable for the following components:   Lipase 66 (*)    All other components within normal limits  CBC WITH DIFFERENTIAL/PLATELET - Abnormal; Notable for the following components:   WBC 12.7 (*)    Neutro Abs 8.5 (*)    All other components within normal limits  RPR  HIV ANTIBODY (ROUTINE TESTING W REFLEX)  I-STAT BETA HCG BLOOD, ED ([facility], [facility], [facility] ONLY)  I-STAT BETA HCG BLOOD, ED ([facility], [facility], [facility] ONLY)  GC/CHLAMYDIA PROBE AMP ([place]) NOT AT [facility]    EKG None  Radiology No results found.  Procedures Procedures    Medications Ordered in ED Medications  sodium chloride 0.9 % bolus 1,000 mL (1,000 mLs Intravenous New Bag/Given 06/01/21 2324)    And  0.9 %  sodium chloride infusion (has no administration in time range)  morphine 4 MG/ML injection 4 mg (4 mg Intravenous Given 06/01/21 2258)  ondansetron (ZOFRAN) injection 4 mg (4 mg Intravenous Given 06/01/21 2303)  labetalol (NORMODYNE) tablet 100 mg (100 mg Oral Given 06/01/21 2308)  amLODipine (NORVASC) tablet 10 mg (10 mg Oral Given 06/01/21 2305)  labetalol (NORMODYNE) injection 20 mg (20 mg  Intravenous Given 06/01/21 2319)  cefTRIAXone (ROCEPHIN) injection 1 g (1 g Intramuscular Given 06/01/21 2254)  lidocaine (PF) (XYLOCAINE) 1 % injection 2.1 mL (2.1 mLs Other Given 06/01/21 2253)    ED Course/ Medical Decision Making/ A&P Clinical Course  as of 06/01/21 2335  Wed Jun 01, 2021  2101 Patient had a negative pregnancy test yesterday.  She also had a urinalysis that showed: 150 white blood cells [JK]  2217 Blood pressure significantly elevated.  Oral meds ordered.  We will also order a dose of IV antihypertensive agents [JK]  2217 Wet prep does show white blood cells and clue cells suggestive of infection [JK]  2237 Lipase slightly elevated at 66.  White blood cell count elevated 12.7.  Metabolic panel unremarkable.  hCG negative [JK]  2332 Pressure improved.  Now 153/103 at the bedside. [JK]    Clinical Course User Index [JK] Linwood Dibbles, MD                           Medical Decision Making  Patient presented to the ED with complaints of lower abdominal pain.  Patient also complained of vaginal discharge.  Concerning for the possibility of PID.  Patient did have cervical motion tenderness on exam.  Laboratory test did not show any signs of hepatitis or pancreatitis.  Patient was treated with Rocephin and doxycycline prescription.  Patient was treated with IV narcotic pain medications.  Symptoms have improved.  Doubt tubo-ovarian abscess.  Appears stable for outpatient management.  Patient also noted to be hypertensive.  She has not taken any medications today.  Patient was treated with IV antihypertensive agents and blood pressure has improved.  She was also given a dose of her symptoms.  Doubt hypertensive emergency and patient appears stable for outpatient management at this time.        Final Clinical Impression(s) / ED Diagnoses Final diagnoses:  PID (acute pelvic inflammatory disease)  Hypertension, unspecified type    Rx / DC Orders ED Discharge Orders           Ordered    doxycycline (VIBRA-TABS) 100 MG tablet  2 times daily        06/01/21 2217    naproxen (NAPROSYN) 375 MG tablet  2 times daily        06/01/21 2333              Linwood Dibbles, MD 06/01/21 2336

## 2021-06-01 NOTE — Telephone Encounter (Signed)
Called patient and she states she is currently at the ER being evaluated but she was originally calling because she couldn't see what was sent to her mychart from the ED from yesterday. Told patient they will certainly answer her questions today while she is there. Patient verbalized understanding.

## 2021-06-01 NOTE — ED Notes (Signed)
Peripheral IV attempted x2 without success.

## 2021-06-01 NOTE — Discharge Instructions (Addendum)
Take the medications as prescribed.  Follow-up with your OB/GYN doctor to be rechecked.  Return as needed for worsening symptoms.  Make sure to take your blood pressure medications and have that rechecked

## 2021-06-01 NOTE — Telephone Encounter (Signed)
Calling for test results ?

## 2021-06-01 NOTE — ED Triage Notes (Signed)
Per pt, states lower pelvic pain since yesterday-states discharge yesterday but it has cleared-history of heavy menstrual cycles-recently give megace in November

## 2021-06-02 ENCOUNTER — Other Ambulatory Visit: Payer: Self-pay

## 2021-06-02 LAB — GC/CHLAMYDIA PROBE AMP (~~LOC~~) NOT AT ARMC
Chlamydia: NEGATIVE
Comment: NEGATIVE
Comment: NORMAL
Neisseria Gonorrhea: NEGATIVE

## 2021-06-02 LAB — HIV ANTIBODY (ROUTINE TESTING W REFLEX): HIV Screen 4th Generation wRfx: NONREACTIVE

## 2021-06-02 LAB — RPR: RPR Ser Ql: NONREACTIVE

## 2021-06-03 ENCOUNTER — Other Ambulatory Visit (HOSPITAL_COMMUNITY): Payer: Self-pay

## 2021-06-03 ENCOUNTER — Other Ambulatory Visit: Payer: Self-pay

## 2021-06-09 ENCOUNTER — Other Ambulatory Visit: Payer: Self-pay

## 2021-08-14 IMAGING — US US TRANSVAGINAL NON-OB
1 series · 15 of 25 positions shown · non-contrast
Comparison: Pelvic ultrasound 09/09/2019

CLINICAL DATA: Lost IUD strings, follow-up transabdominal
ultrasound

EXAM:
ULTRASOUND PELVIS TRANSVAGINAL
TECHNIQUE: Transvaginal ultrasound examination of the pelvis was performed
including evaluation of the uterus, ovaries, adnexal regions, and
pelvic cul-de-sac.

[Series 1: us transvaginal non-ob · 15 of 42 slices shown]
[im 1/42]
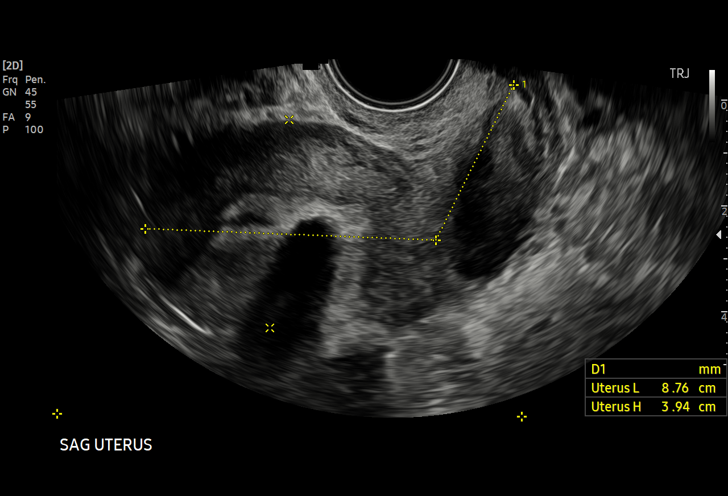
[im 4/42]
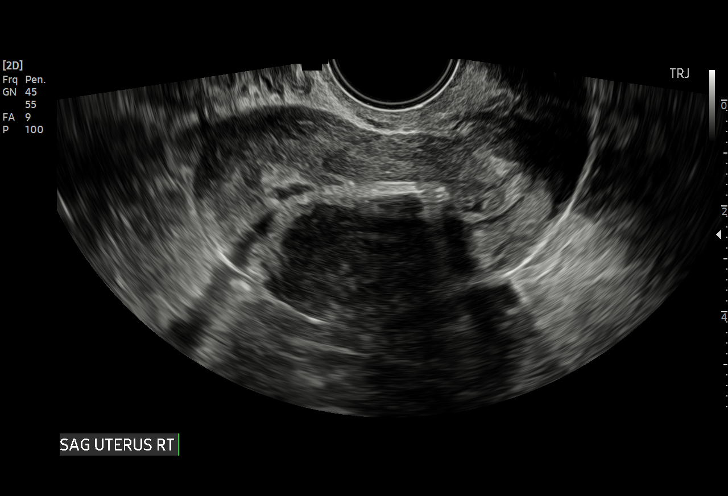
[im 7/42]
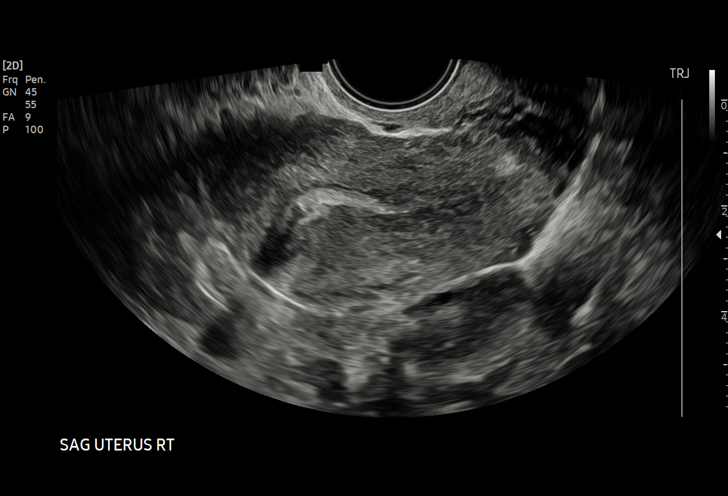
[im 9/42]
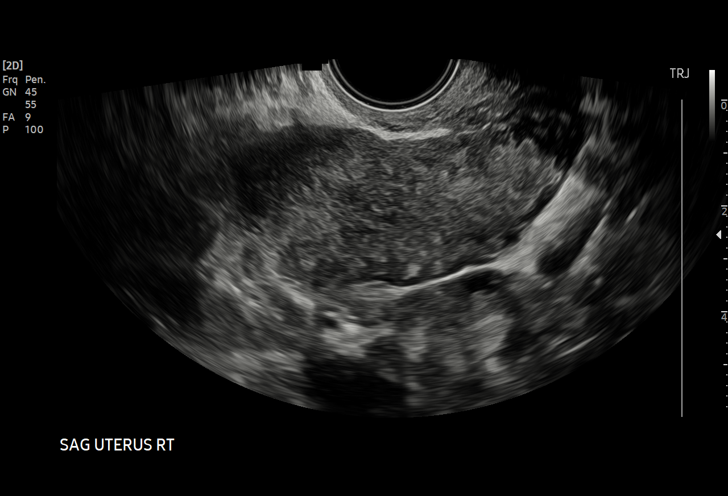
[im 12/42]
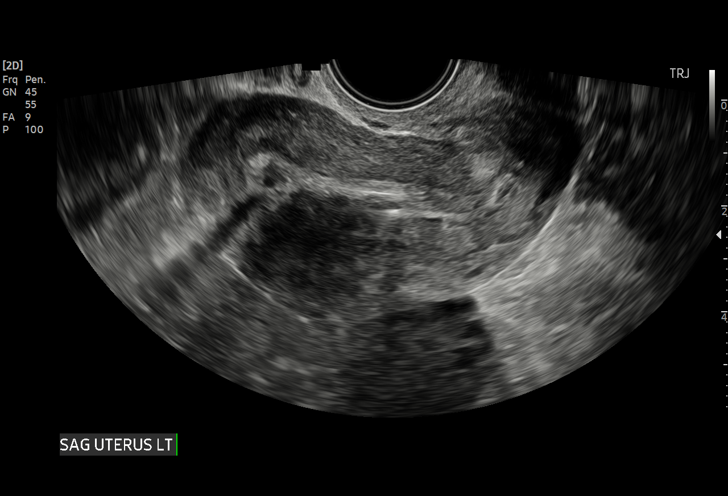
[im 16/42]
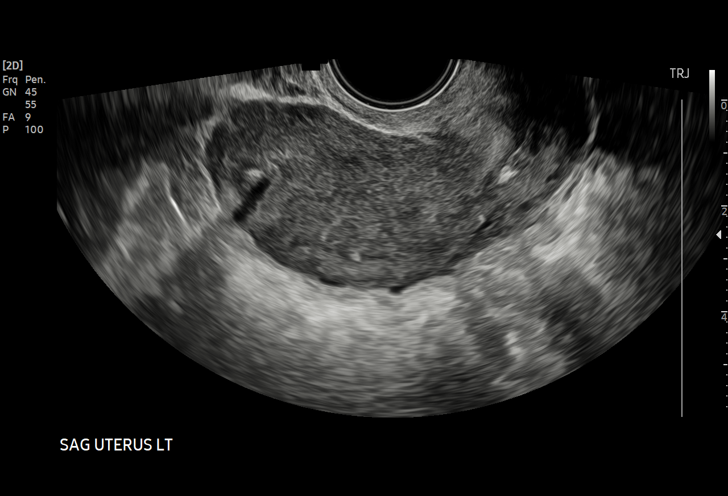
[im 18/42]
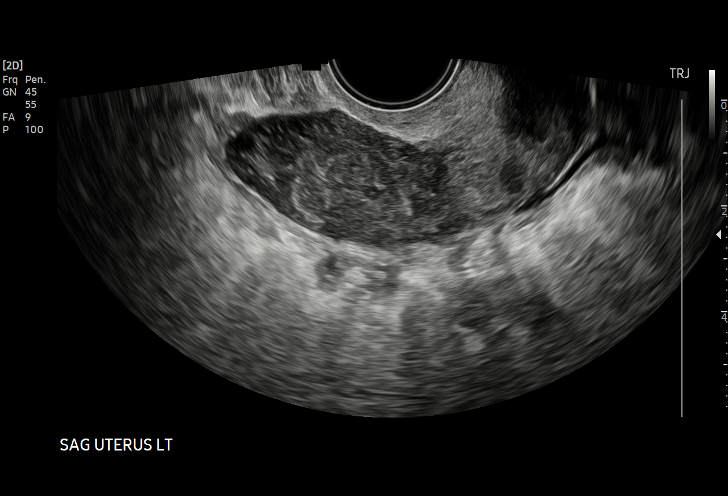
[im 21/42]
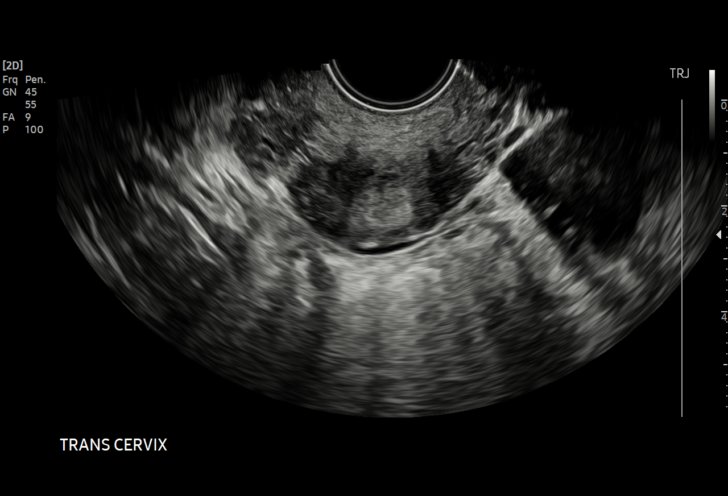
[im 24/42]
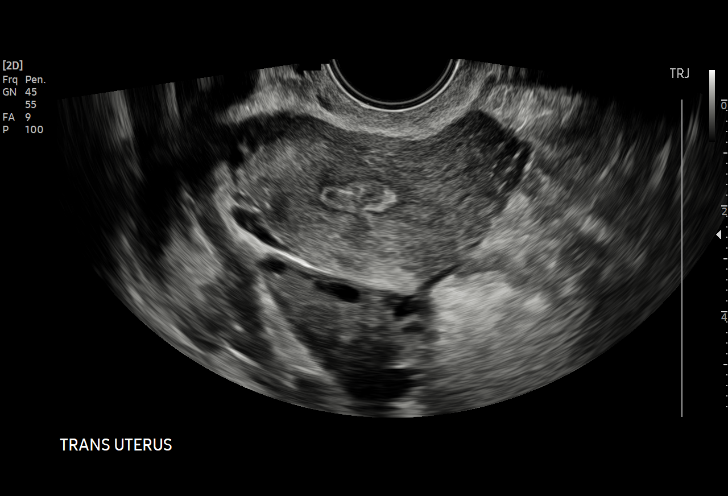
[im 26/42]
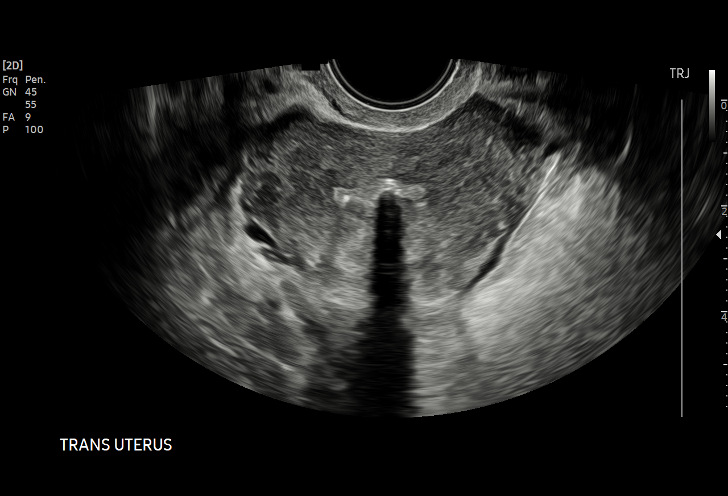
[im 30/42]
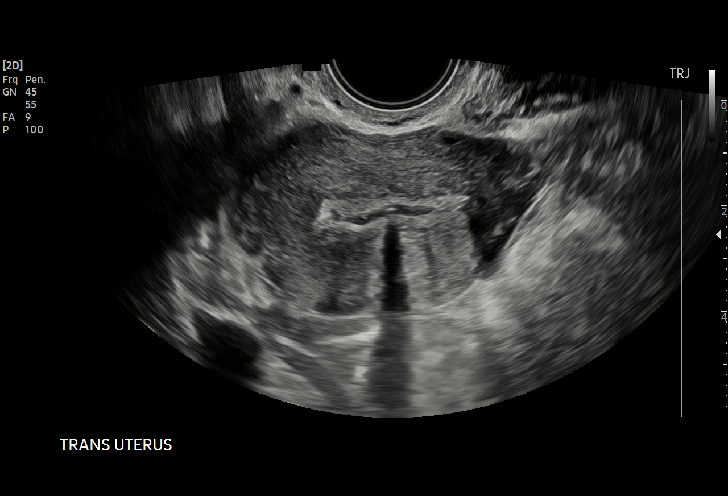
[im 33/42]
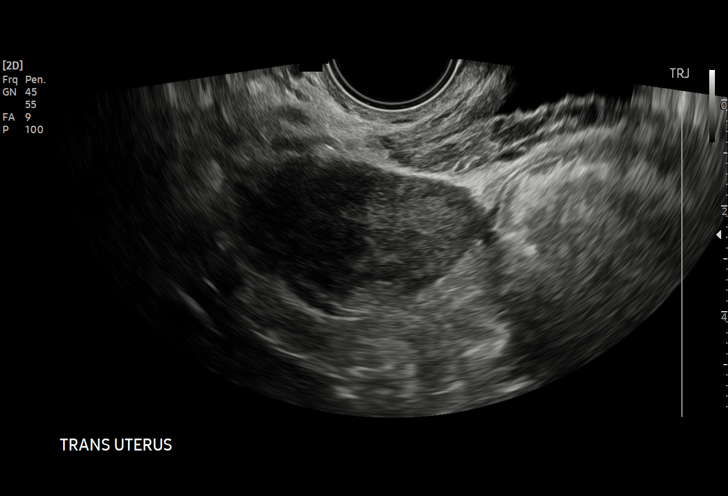
[im 35/42]
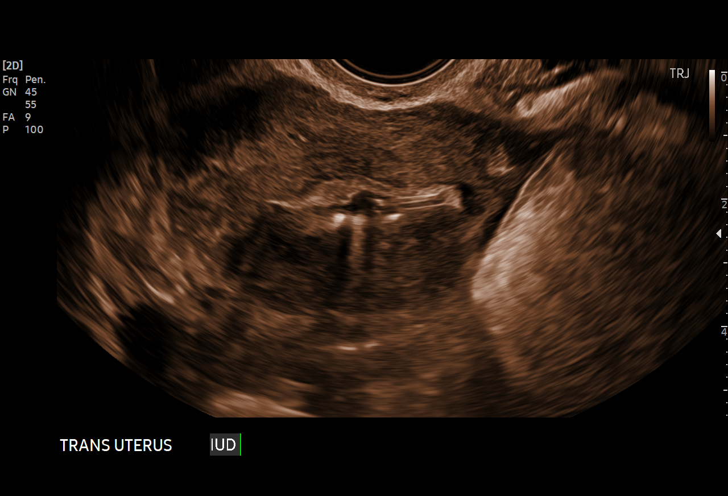
[im 38/42]
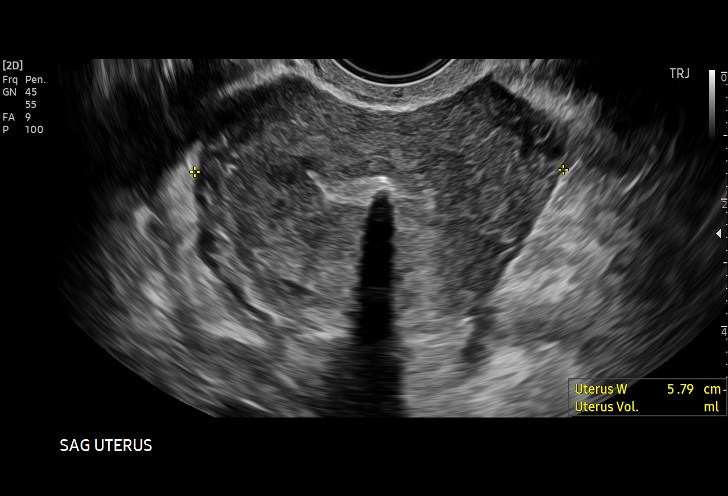
[im 42/42]
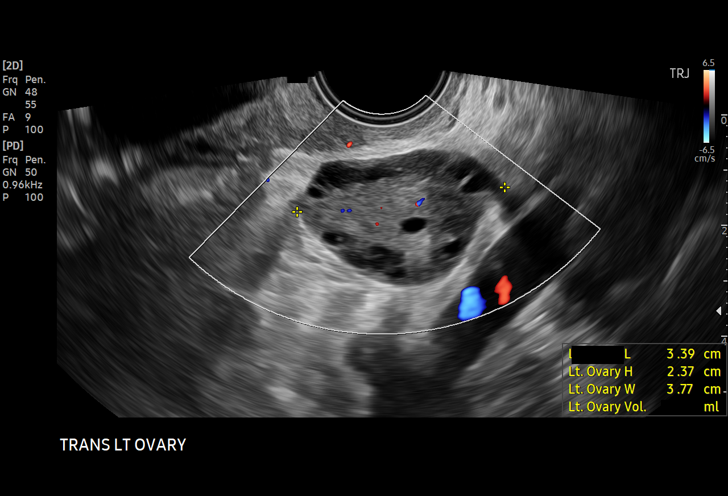

[15 of 25 positions shown; findings below may reference images not displayed]

FINDINGS: Uterus

Measurements: 8.8 x 3.9 x 5.8 cm = volume: 104.7 mL. No fibroids or
other mass visualized.

Endometrium

Thickness: 5.7 mm. Echogenic, shadowing intrauterine device is seen
appropriately within the endometrial canal. A thin linear echogenic
structure likely reflecting the IUD string seen extending from the
inferior end of the device posterior and to the left extending into
the endometrial and subendometrial soft tissues. A small volume of
fluid is seen near the superior margin of the endometrial cavity
about the IUD.

Right ovary

Measurements: 4.1 x 2.1 x 3.1 = volume: 14.3 mL. Normal
appearance/no adnexal mass.

Left ovary

Measurements: 3.4 x 2.4 x 3.8 cm = volume: 15.9 mL. Normal
appearance/no adnexal mass.

Other findings:  No abnormal free fluid
IMPRESSION: The IUD appears normally positioned towards the uterine fundus with
the limbs well extended. However, likely corresponding to the
abnormality seen on prior transabdominal ultrasound, the radiopaque
IUD strings however appear to extend left posterolateral from the
inferior tip of the device through the endometrial tissue and into
the subendometrial tissues.

Small volume of nonspecific fluid noted towards the uterine fundus
about the IUD.

## 2021-09-19 ENCOUNTER — Ambulatory Visit (HOSPITAL_COMMUNITY)
Admission: EM | Admit: 2021-09-19 | Discharge: 2021-09-19 | Disposition: A | Discharge status: Home / Self Care | Payer: Self-pay | Attending: Internal Medicine | Admitting: Internal Medicine

## 2021-09-19 ENCOUNTER — Encounter (HOSPITAL_COMMUNITY): Payer: Self-pay | Admitting: Emergency Medicine

## 2021-09-19 ENCOUNTER — Other Ambulatory Visit: Payer: Self-pay

## 2021-09-19 ENCOUNTER — Ambulatory Visit (INDEPENDENT_AMBULATORY_CARE_PROVIDER_SITE_OTHER): Payer: Self-pay

## 2021-09-19 DIAGNOSIS — M25562 Pain in left knee: Secondary | ICD-10-CM

## 2021-09-19 MED ORDER — NAPROXEN 500 MG PO TABS
500.0000 mg | ORAL_TABLET | Freq: Two times a day (BID) | ORAL | 0 refills | Status: DC
  Filled 2021-09-19: qty 30, 15d supply, fill #0

## 2021-09-19 MED ORDER — KETOROLAC TROMETHAMINE 60 MG/2ML IM SOLN
INTRAMUSCULAR | Status: AC
  Filled 2021-09-19: qty 2

## 2021-09-19 MED ORDER — KETOROLAC TROMETHAMINE 60 MG/2ML IM SOLN
60.0000 mg | Freq: Once | INTRAMUSCULAR | Status: AC
  Administered 2021-09-19: 60 mg via INTRAMUSCULAR

## 2021-09-19 NOTE — Discharge Instructions (Addendum)
Your x-ray today was negative. ? ?We have provided you with a knee brace for your left knee.  Please wear this for compression and stabilization of your left knee.  You were given a pain medicine injection today.  Do not take any more NSAID medication tonight (naproxen, ibuprofen, Aleve, Advil, aspirin, Goody powder). ?You may resume taking the prescribed naproxen 500 mg tomorrow morning with food.  It is very important that you take this medication with food to avoid GI upset.  Please take this twice daily for the next 5 days to help with inflammation and pain.  After 5 days of resting, icing, and elevating your left leg, your leg should feel better.  If it does not feel better, please follow-up with sports medicine within the next week or two. I have included their contact information in your paperwork.  ? ?Return to urgent care for new or or worsening symptoms.  Go to the emergency room if your symptoms are severe. ?

## 2021-09-19 NOTE — ED Triage Notes (Signed)
Pain on the inside of left knee.  No known injury.  Pain started 3 days ago.  Now pain and swelling from knee and including lkeft ankle ?

## 2021-09-19 NOTE — ED Provider Notes (Signed)
?[facility] ? ? ? ?CSN: [id number] ?Arrival date & time: 09/19/21  1836 ? ? ?  ? ?History   ?Chief Complaint ?Chief Complaint  ?Patient presents with  ? Leg Pain  ? ? ?HPI ?Dana Allison is a 36 y.o. female.  ? ?Patient presents to urgent care for 3-day complaint of left knee pain.  Pain is mostly to the medial and posterior aspect of her left knee.  States that she works at [employer] and does a lot of walking.  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  She has pain with ambulation and decreased range of motion due to tenderness in the left knee.  Denies fever, shortness of breath, chest pain, prior injury to the left knee, loss of sensation, numbness, and tingling to the left leg.  States she took three 800 mg ibuprofen tablets yesterday that temporarily relieve the pain.  When the medication wore off, the pain returned abruptly.  Denies icing or elevating her knee.  Denies any other aggravating or relieving factors. ? ? ?Leg Pain ? ?Past Medical History:  ?Diagnosis Date  ? Endometrial polyp 03/17/2019  ? Hypertension   ? Morbid obesity (HCC)   ? Pre-diabetes   ? per patient  ? ? ?Patient Active Problem List  ? Diagnosis Date Noted  ? Malpositioned intrauterine device (IUD) 03/28/2021  ? Severe dysplasia of cervix (CIN III) 12/20/2020  ? LGSIL on Pap smear of cervix 11/01/2020  ? Morbid obesity (HCC)   ? Hypertension   ? Pre-diabetes   ? DUB (dysfunctional uterine bleeding) 09/19/2018  ? OBESITY, NOS 07/26/2006  ? ? ?Past Surgical History:  ?Procedure Laterality Date  ? HYSTEROSCOPY WITH D & C N/A 04/08/2019  ? Procedure: DILATATION AND CURETTAGE /HYSTEROSCOPY WITH ENDOMETRIAL POLYPECTOMY;  Surgeon: Hermina StaggersErvin, Michael L, MD;  Location: [facility] OR;  Service: Gynecology;  Laterality: N/A;  ? NO PAST SURGERIES    ? ? ?OB History   ? ? Gravida  ?0  ? Para  ?0  ? Term  ?0  ? Preterm  ?0  ? AB  ?0  ? Living  ?0  ?  ? ? SAB   ?0  ? IAB  ?0  ? Ectopic  ?0  ? Multiple  ?0  ? Live Births  ?0  ?   ?  ?  ? ? ? ?Home Medications   ? ?Prior to Admission medications   ?Medication Sig Start Date End Date Taking? Authorizing Provider  ?naproxen (NAPROSYN) 500 MG tablet Take 1 tablet (500 mg total) by mouth 2 (two) times daily. 09/19/21  Yes Carlisle BeersStanhope, Argil Mahl M, FNP  ?amLODipine (NORVASC) 10 MG tablet TAKE 1 TABLET (10 MG TOTAL) BY MOUTH DAILY. TO LOWER BLOOD PRESSURE ?Patient not taking: Reported on 06/01/2021 01/27/21 01/27/22  Anders SimmondsMcClung, Angela M, PA-C  ?cephALEXin (KEFLEX) 500 MG capsule Take 1 capsule (500 mg total) by mouth 4 (four) times daily. ?Patient not taking: Reported on 06/01/2021 03/05/21   Doran ClayZeitany, Alana M, MD  ?labetalol (NORMODYNE) 100 MG tablet Take 1 tablet (100 mg total) by mouth 2 (two) times daily. To lower blood pressure and heart rate ?Patient not taking: Reported on 06/01/2021 01/27/21 07/10/21  Anders SimmondsMcClung, Angela M, PA-C  ? ? ?Family History ?Family History  ?Problem Relation Age of Onset  ? Hypertension Mother   ? Diabetes Mother   ? Diabetes Father   ?  Hypertension Sister   ? Hypertension Sister   ? ? ?Social History ?Social History  ? ?Tobacco Use  ? Smoking status: Never  ? Smokeless tobacco: Never  ?Vaping Use  ? Vaping Use: Never used  ?Substance Use Topics  ? Alcohol use: Yes  ?  Comment: occasionally  ? Drug use: No  ? ? ? ?Allergies   ?Patient has no known allergies. ? ? ?Review of Systems ?Review of Systems ?Per HPI ? ?Physical Exam ?Triage Vital Signs ?ED Triage Vitals  ?Enc Vitals Group  ?   BP 09/19/21 1933 (!) 178/124  ?   Pulse Rate 09/19/21 1933 (!) 105  ?   Resp 09/19/21 1933 (!) 22  ?   Temp 09/19/21 1933 100 ?F (37.8 ?C)  ?   Temp Source 09/19/21 1933 Oral  ?   SpO2 09/19/21 1933 99 %  ?   Weight --   ?   Height --   ?   Head Circumference --   ?   Peak Flow --   ?   Pain Score 09/19/21 1930 10  ?   Pain Loc --   ?   Pain Edu? --   ?   Excl. in GC? --   ? ?No data found. ? ?Updated Vital Signs ?BP (!) 178/124 (BP  Location: Left Arm) Comment (BP Location): regular cuff on forearm/has not had blood pressure medicines for a month  Pulse (!) 105   Temp 100 ?F (37.8 ?C) (Oral)   Resp (!) 22   LMP  (LMP Unknown) Comment: 09/19/2021 patient says she is going to her pcp to discuss this  SpO2 99%  ? ?Visual Acuity ?Right Eye Distance:   ?Left Eye Distance:   ?Bilateral Distance:   ? ?Right Eye Near:   ?Left Eye Near:    ?Bilateral Near:    ? ?Physical Exam ?Vitals and nursing note reviewed.  ?Constitutional:   ?   General: She is not in acute distress. ?   Appearance: Normal appearance. She is well-developed. She is not ill-appearing.  ?HENT:  ?   Head: Normocephalic and atraumatic.  ?   Right Ear: Tympanic membrane, ear canal and external ear normal.  ?   Left Ear: Tympanic membrane, ear canal and external ear normal.  ?   Nose: Nose normal.  ?   Mouth/Throat:  ?   Mouth: Mucous membranes are moist.  ?Eyes:  ?   Extraocular Movements: Extraocular movements intact.  ?   Conjunctiva/sclera: Conjunctivae normal.  ?Cardiovascular:  ?   Rate and Rhythm: Normal rate and regular rhythm.  ?   Heart sounds: Normal heart sounds. No murmur heard. ?  No friction rub. No gallop.  ?Pulmonary:  ?   Effort: Pulmonary effort is normal. No respiratory distress.  ?   Breath sounds: Normal breath sounds. No wheezing, rhonchi or rales.  ?Chest:  ?   Chest wall: No tenderness.  ?Abdominal:  ?   General: Bowel sounds are normal.  ?   Palpations: Abdomen is soft.  ?   Tenderness: There is no abdominal tenderness. There is no right CVA tenderness or left CVA tenderness.  ?Musculoskeletal:     ?   General: No swelling.  ?   Cervical back: Normal range of motion and neck supple.  ?   Right knee: Normal.  ?   Left knee: Swelling and bony tenderness present. No erythema, ecchymosis or crepitus. Tenderness present over the MCL and PCL. Normal alignment and normal  patellar mobility. Normal pulse.  ?   Comments: Unable to perform instability tests due to  patient's level of pain.  ?Skin: ?   General: Skin is warm and dry.  ?   Capillary Refill: Capillary refill takes less than 2 seconds.  ?   Findings: No rash.  ?Neurological:  ?   General: No focal deficit present.  ?   Mental Status: She is alert and oriented to person, place, and time.  ?Psychiatric:     ?   Mood and Affect: Mood normal.     ?   Behavior: Behavior normal.     ?   Thought Content: Thought content normal.     ?   Judgment: Judgment normal.  ? ? ? ?UC Treatments / Results  ?Labs ?(all labs ordered are listed, but only abnormal results are displayed) ?Labs Reviewed - No data to display ? ?EKG ? ? ?Radiology ?DG Knee Complete 4 Views Left ? ?Result Date: 09/19/2021 ?CLINICAL DATA:  Knee pain no known injury. EXAM: LEFT KNEE - COMPLETE 4+ VIEW COMPARISON:  None. FINDINGS: No evidence of fracture, dislocation, or joint effusion. No evidence of arthropathy or other focal bone abnormality. Soft tissues are unremarkable. IMPRESSION: Negative. Electronically Signed   By: Larose Hires D.O.   On: 09/19/2021 20:33   ? ?Procedures ?Procedures (including critical care time) ? ?Medications Ordered in UC ?Medications  ?ketorolac (TORADOL) injection 60 mg (60 mg Intramuscular Given 09/19/21 2007)  ? ? ?Initial Impression / Assessment and Plan / UC Course  ?I have reviewed the triage vital signs and the nursing notes. ? ?Pertinent labs & imaging results that were available during my care of the patient were reviewed by me and considered in my medical decision making (see chart for details). ? ?Patient presents with acute knee pain today.  She is unable to walk without a limp and is very tender to palpation. X-ray of the left knee obtained in clinic today was negative for any bony abnormality.  Gait observed while patient was walking to x-ray room.  She is able to bear weight on her left leg, but does limp and is in pain with flexion of the left knee. ? ?Last dose of NSAID was yesterday.  Patient given IM 60 mg  ketorolac in clinic today for pain management. ? ?Plan to send patient home with prescription of naproxen 500 mg to be taken twice daily.  Kidney function on CMP in January 2023 assessed prior to prescribing NSAID.  Patient i

## 2021-09-19 NOTE — ED Notes (Signed)
Patient not in treatment room.  Patient discharged by other ?

## 2021-09-20 ENCOUNTER — Other Ambulatory Visit: Payer: Self-pay

## 2021-09-21 ENCOUNTER — Encounter: Payer: Self-pay | Admitting: Obstetrics and Gynecology

## 2021-09-21 ENCOUNTER — Ambulatory Visit (INDEPENDENT_AMBULATORY_CARE_PROVIDER_SITE_OTHER): Payer: Medicaid Other | Admitting: Obstetrics and Gynecology

## 2021-09-21 ENCOUNTER — Other Ambulatory Visit (HOSPITAL_COMMUNITY)
Admission: RE | Admit: 2021-09-21 | Discharge: 2021-09-21 | Disposition: A | Discharge status: Home / Self Care | Payer: Medicaid Other | Source: Ambulatory Visit | Attending: Obstetrics and Gynecology | Admitting: Obstetrics and Gynecology

## 2021-09-21 VITALS — BP 139/94 | HR 87 | Ht 68.0 in | Wt 291.9 lb

## 2021-09-21 DIAGNOSIS — N911 Secondary amenorrhea: Secondary | ICD-10-CM

## 2021-09-21 DIAGNOSIS — Z3202 Encounter for pregnancy test, result negative: Secondary | ICD-10-CM

## 2021-09-21 DIAGNOSIS — Z6841 Body Mass Index (BMI) 40.0 and over, adult: Secondary | ICD-10-CM | POA: Diagnosis not present

## 2021-09-21 DIAGNOSIS — Z124 Encounter for screening for malignant neoplasm of cervix: Secondary | ICD-10-CM

## 2021-09-21 DIAGNOSIS — D069 Carcinoma in situ of cervix, unspecified: Secondary | ICD-10-CM

## 2021-09-21 DIAGNOSIS — Z0001 Encounter for general adult medical examination with abnormal findings: Secondary | ICD-10-CM | POA: Diagnosis not present

## 2021-09-21 DIAGNOSIS — N879 Dysplasia of cervix uteri, unspecified: Secondary | ICD-10-CM

## 2021-09-21 DIAGNOSIS — Z01411 Encounter for gynecological examination (general) (routine) with abnormal findings: Secondary | ICD-10-CM

## 2021-09-21 DIAGNOSIS — Z01419 Encounter for gynecological examination (general) (routine) without abnormal findings: Secondary | ICD-10-CM | POA: Diagnosis not present

## 2021-09-21 DIAGNOSIS — R87611 Atypical squamous cells cannot exclude high grade squamous intraepithelial lesion on cytologic smear of cervix (ASC-H): Secondary | ICD-10-CM

## 2021-09-21 LAB — POCT PREGNANCY, URINE: Preg Test, Ur: NEGATIVE

## 2021-09-21 NOTE — Progress Notes (Signed)
? ? ?GYNECOLOGY ANNUAL PREVENTATIVE CARE ENCOUNTER NOTE ? ?History:    ? Dana Allison is a 35 y.o. G0P0000 female here for a routine annual gynecologic exam.  Current complaints: amenorrhea hx of abnormal pap.   Denies abnormal vaginal bleeding, discharge, pelvic pain, problems with intercourse or other gynecologic concerns.  ?  ?Gynecologic History ?No LMP recorded (lmp unknown). ?Contraception: none ?Last Pap: 11/01/20. Results were: abnormal , pt is s/p colposcopy and LEEP ? ?Obstetric History ?OB History  ?Gravida Para Term Preterm AB Living  ?0 0 0 0 0 0  ?SAB IAB Ectopic Multiple Live Births  ?0 0 0 0 0  ? ? ?Past Medical History:  ?Diagnosis Date  ? Endometrial polyp 03/17/2019  ? Hypertension   ? Morbid obesity (HCC)   ? Pre-diabetes   ? per patient  ? ? ?Past Surgical History:  ?Procedure Laterality Date  ? HYSTEROSCOPY WITH D & C N/A 04/08/2019  ? Procedure: DILATATION AND CURETTAGE /HYSTEROSCOPY WITH ENDOMETRIAL POLYPECTOMY;  Surgeon: Hermina Staggers, MD;  Location: [facility];  Service: Gynecology;  Laterality: N/A;  ? NO PAST SURGERIES    ? ? ?Current Outpatient Medications on File Prior to Visit  ?Medication Sig Dispense Refill  ? amLODipine (NORVASC) 10 MG tablet TAKE 1 TABLET (10 MG TOTAL) BY MOUTH DAILY. TO LOWER BLOOD PRESSURE 90 tablet 1  ? labetalol (NORMODYNE) 100 MG tablet Take 1 tablet (100 mg total) by mouth 2 (two) times daily. To lower blood pressure and heart rate 180 tablet 1  ? naproxen (NAPROSYN) 500 MG tablet Take 1 tablet (500 mg total) by mouth 2 (two) times daily. 30 tablet 0  ? cephALEXin (KEFLEX) 500 MG capsule Take 1 capsule (500 mg total) by mouth 4 (four) times daily. (Patient not taking: Reported on 06/01/2021) 20 capsule 0  ? ?No current facility-administered medications on file prior to visit.  ? ? ?No Known Allergies ? ?Social History:  reports that she has never smoked. She has never used smokeless tobacco. She reports current alcohol use. She reports that she does not use  drugs. ? ?Family History  ?Problem Relation Age of Onset  ? Hypertension Mother   ? Diabetes Mother   ? Diabetes Father   ? Hypertension Sister   ? Hypertension Sister   ? ? ?The following portions of the patient's history were reviewed and updated as appropriate: allergies, current medications, past family history, past medical history, past social history, past surgical history and problem list. ? ?Review of Systems ?Pertinent items noted in HPI and remainder of comprehensive ROS otherwise negative. ? ?Physical Exam:  ?BP (!) 139/94   Pulse 87   Ht 5\' 8"  (1.727 m)   Wt 132.4 kg   LMP  (LMP Unknown) Comment: 09/19/2021 patient says she is going to her pcp to discuss this  BMI 44.38 kg/m?  ?CONSTITUTIONAL: Well-developed, well-nourished female in no acute distress.  ?HENT:  Normocephalic, atraumatic, External right and left ear normal. Oropharynx is clear and moist ?EYES: Conjunctivae and EOM are normal.  ?NECK: Normal range of motion, supple, no masses.  Normal thyroid. Acanthosis nigricans noted ?SKIN: Skin is warm and dry. No rash noted. Not diaphoretic. No erythema. No pallor.  Pt does shave or have hair removal on the face. ?MUSCULOSKELETAL: Normal range of motion. No tenderness.  No cyanosis, clubbing, or edema.  2+ distal pulses. ?NEUROLOGIC: Alert and oriented to person, place, and time. Normal reflexes, muscle tone coordination.  ?PSYCHIATRIC: Normal mood and affect. Normal behavior.  Normal judgment and thought content. ?CARDIOVASCULAR: Normal heart rate noted, regular rhythm ?RESPIRATORY: Clear to auscultation bilaterally. Effort and breath sounds normal, no problems with respiration noted. ?BREASTS: deferred ?ABDOMEN: Soft, no distention noted.  No tenderness, rebound or guarding. obese ?PELVIC: Normal appearing external genitalia and urethral meatus; normal appearing vaginal mucosa and cervix.  No abnormal discharge noted.  Pap smear obtained.  Normal uterine size, no other palpable masses, no  uterine or adnexal tenderness.  Performed in the presence of a chaperone. ?  ?Assessment and Plan:  ?  1. Pap smear for cervical cancer screening ? ?- Cytology - PAP( [facility]) ? ?2. Women's annual routine gynecological examination ?Normal physical exam ? ?3. BMI 40.0-44.9, adult ([place]) ? ? ?4. Amenorrhea, secondary ?Unsure if medication effect as patient has been on megace 3-4 months ago.  No other hormonal intervention.  Pt very suspicious for diagnosis of PCOS, especially with abnormal hair growth.  Pt advised to pursue 10% weight loss.  Can consider OCP, but her blood pressure was borderline elevated.  Initial weight loss goal is 12 pounds. ? ?5. Severe dysplasia of cervix (CIN III) ?LEEP had negative margins, repap taken today. ? ?Will follow up results of pap smear and manage accordingly. ?Routine preventative health maintenance measures emphasized. ?Please refer to After Visit Summary for other counseling recommendations.  ?   ?Gyn follow up in 2 months to discuss cycles and weight loss. ?Lynnda Shields, MD, [facility] ?Obstetrician Social research officer, government, Faculty Practice ?[facility] [place]  ?

## 2021-09-21 NOTE — Progress Notes (Signed)
Pt states has not had a cycle in 3 months, took pregnancy test & it was negative. ?

## 2021-09-26 LAB — CYTOLOGY - PAP
Chlamydia: NEGATIVE
Comment: NEGATIVE
Comment: NEGATIVE
Comment: NEGATIVE
Comment: NORMAL
Diagnosis: UNDETERMINED — AB
High risk HPV: NEGATIVE
Neisseria Gonorrhea: NEGATIVE
Trichomonas: POSITIVE — AB

## 2021-09-27 ENCOUNTER — Telehealth: Payer: Self-pay

## 2021-09-27 ENCOUNTER — Other Ambulatory Visit: Payer: Self-pay

## 2021-09-27 MED ORDER — METRONIDAZOLE 500 MG PO TABS
500.0000 mg | ORAL_TABLET | Freq: Two times a day (BID) | ORAL | 0 refills | Status: DC
  Filled 2021-09-27: qty 14, 7d supply, fill #0

## 2021-09-27 NOTE — Telephone Encounter (Addendum)
-----   Message from Warden Fillers, MD sent at 09/27/2021  2:01 PM EDT ----- ?Pap smear showed ASCUS with negative HPV, recommend repap in 3 years.  However, trich seen on pap as well.  Will offer treatment ? ?Notified pt of results and that Flagyl has been sent to [facility].  I explained to the pt if she could please make sure that her partner(s) get treated as well and abstain from intercourse for a week after both are treated.   ? ?Shavy Beachem,RN  ?09/27/21 ?

## 2021-09-28 ENCOUNTER — Other Ambulatory Visit: Payer: Self-pay

## 2021-12-29 ENCOUNTER — Other Ambulatory Visit: Payer: Self-pay

## 2022-01-20 ENCOUNTER — Other Ambulatory Visit (HOSPITAL_COMMUNITY): Payer: Self-pay

## 2022-01-27 ENCOUNTER — Other Ambulatory Visit (HOSPITAL_COMMUNITY): Payer: Self-pay

## 2022-01-28 ENCOUNTER — Other Ambulatory Visit (HOSPITAL_COMMUNITY): Payer: Self-pay

## 2022-03-16 ENCOUNTER — Other Ambulatory Visit: Payer: Self-pay

## 2022-03-16 ENCOUNTER — Other Ambulatory Visit: Payer: Self-pay | Admitting: Physician Assistant

## 2022-03-16 DIAGNOSIS — R000 Tachycardia, unspecified: Secondary | ICD-10-CM

## 2022-03-16 DIAGNOSIS — I10 Essential (primary) hypertension: Secondary | ICD-10-CM

## 2022-03-16 MED ORDER — BENZONATATE 100 MG PO CAPS
200.0000 mg | ORAL_CAPSULE | Freq: Three times a day (TID) | ORAL | 0 refills | Status: DC | PRN
  Filled 2022-03-16: qty 60, 10d supply, fill #0

## 2022-03-16 MED ORDER — ALBUTEROL SULFATE HFA 108 (90 BASE) MCG/ACT IN AERS
INHALATION_SPRAY | RESPIRATORY_TRACT | 0 refills | Status: DC
Start: 2022-03-16 — End: 2022-05-12
  Filled 2022-03-16: qty 6.7, 25d supply, fill #0
  Filled 2022-03-16: qty 8, 16d supply, fill #0

## 2022-03-21 ENCOUNTER — Other Ambulatory Visit: Payer: Self-pay

## 2022-04-02 ENCOUNTER — Encounter (HOSPITAL_COMMUNITY): Payer: Self-pay

## 2022-04-02 ENCOUNTER — Emergency Department (HOSPITAL_COMMUNITY)
Admission: EM | Admit: 2022-04-02 | Discharge: 2022-04-02 | Disposition: A | Discharge status: Home / Self Care | Payer: BC Managed Care – PPO | Attending: Emergency Medicine | Admitting: Emergency Medicine

## 2022-04-02 DIAGNOSIS — R1030 Lower abdominal pain, unspecified: Secondary | ICD-10-CM | POA: Diagnosis present

## 2022-04-02 DIAGNOSIS — Z760 Encounter for issue of repeat prescription: Secondary | ICD-10-CM | POA: Insufficient documentation

## 2022-04-02 LAB — URINALYSIS, ROUTINE W REFLEX MICROSCOPIC
Bilirubin Urine: NEGATIVE
Glucose, UA: NEGATIVE mg/dL
Ketones, ur: NEGATIVE mg/dL
Leukocytes,Ua: NEGATIVE
Nitrite: NEGATIVE
Protein, ur: 100 mg/dL — AB
RBC / HPF: >50 RBC/hpf — ABNORMAL HIGH (ref 0–5)
Specific Gravity, Urine: 1.025 (ref 1.005–1.030)
pH: 5 (ref 5.0–8.0)

## 2022-04-02 LAB — PREGNANCY, URINE: Preg Test, Ur: NEGATIVE

## 2022-04-02 LAB — COMPREHENSIVE METABOLIC PANEL
ALT: 37 U/L (ref 0–44)
AST: 29 U/L (ref 15–41)
Albumin: 3.9 g/dL (ref 3.5–5.0)
Alkaline Phosphatase: 69 U/L (ref 38–126)
Anion gap: 8 (ref 5–15)
BUN: 11 mg/dL (ref 6–20)
CO2: 23 mmol/L (ref 22–32)
Calcium: 9.2 mg/dL (ref 8.9–10.3)
Chloride: 109 mmol/L (ref 98–111)
Creatinine, Ser: 0.76 mg/dL (ref 0.44–1.00)
GFR, Estimated: >60 mL/min (ref >60)
Glucose, Bld: 115 mg/dL — ABNORMAL HIGH (ref 70–99)
Potassium: 3.4 mmol/L — ABNORMAL LOW (ref 3.5–5.1)
Sodium: 140 mmol/L (ref 135–145)
Total Bilirubin: 0.7 mg/dL (ref 0.3–1.2)
Total Protein: 8.1 g/dL (ref 6.5–8.1)

## 2022-04-02 LAB — CBC WITH DIFFERENTIAL/PLATELET
Abs Immature Granulocytes: 0.04 10*3/uL (ref 0.00–0.07)
Basophils Absolute: 0.1 10*3/uL (ref 0.0–0.1)
Basophils Relative: 1 %
Eosinophils Absolute: 0.1 10*3/uL (ref 0.0–0.5)
Eosinophils Relative: 1 %
HCT: 39.4 % (ref 36.0–46.0)
Hemoglobin: 13.4 g/dL (ref 12.0–15.0)
Immature Granulocytes: 0 %
Lymphocytes Relative: 20 %
Lymphs Abs: 2.6 10*3/uL (ref 0.7–4.0)
MCH: 31.2 pg (ref 26.0–34.0)
MCHC: 34 g/dL (ref 30.0–36.0)
MCV: 91.8 fL (ref 80.0–100.0)
Monocytes Absolute: 0.9 10*3/uL (ref 0.1–1.0)
Monocytes Relative: 7 %
Neutro Abs: 9.2 10*3/uL — ABNORMAL HIGH (ref 1.7–7.7)
Neutrophils Relative %: 71 %
Platelets: 301 10*3/uL (ref 150–400)
RBC: 4.29 MIL/uL (ref 3.87–5.11)
RDW: 13 % (ref 11.5–15.5)
WBC: 12.9 10*3/uL — ABNORMAL HIGH (ref 4.0–10.5)
nRBC: 0 % (ref 0.0–0.2)

## 2022-04-02 LAB — LIPASE, BLOOD: Lipase: 28 U/L (ref 11–51)

## 2022-04-02 MED ORDER — ALBUTEROL SULFATE HFA 108 (90 BASE) MCG/ACT IN AERS
1.0000 | INHALATION_SPRAY | Freq: Four times a day (QID) | RESPIRATORY_TRACT | 1 refills | Status: DC | PRN
  Filled 2022-04-02: qty 6.7, 25d supply, fill #0

## 2022-04-02 MED ORDER — KETOROLAC TROMETHAMINE 30 MG/ML IJ SOLN
15.0000 mg | Freq: Once | INTRAMUSCULAR | Status: AC
  Administered 2022-04-02: 15 mg via INTRAVENOUS
  Filled 2022-04-02: qty 1

## 2022-04-02 NOTE — Discharge Instructions (Addendum)
Return for any problem.  Take ibuprofen as instructed for pain.  If you experience increased pain, fever, nausea, vomiting, or other emergent condition please return to the ED for evaluation.

## 2022-04-02 NOTE — ED Triage Notes (Signed)
Pt arrived via POV, c/o abd pain. Denies any n/v or diarrhea. States she was dx with bronchitis and still feels residual sx after tx.

## 2022-04-02 NOTE — ED Provider Notes (Signed)
[facility]-EMERGENCY DEPT Provider Note   CSN: [id number] Arrival date & time: 04/02/22  1540     History  Chief Complaint  Patient presents with   Abdominal Pain    Samiah Cullen is a 36 y.o. female.  36 year old female with prior medical history as detailed below presents for relation.  Patient complains of mild lower abdominal diffuse abdominal discomfort.  This began over the course of the last 2 days.  Patient denies nausea or vomiting.  She denies urinary symptoms.  She does report that she is on her period.  She reports somewhat irregular periods.  She cannot recall the last date of her last menstrual period.  She reports some prior issues with menstrual cramps.  She has not taken anything for pain today.  She denies fever.  She denies vomiting.  The history is provided by the patient and medical records.       Home Medications Prior to Admission medications   Medication Sig Start Date End Date Taking? Authorizing Provider  albuterol (VENTOLIN HFA) 108 (90 Base) MCG/ACT inhaler Inhale 1-2 puffs into the lungs every 6 (six) hours as needed for wheezing or shortness of breath. 04/02/22  Yes Wynetta Fines, MD  albuterol (VENTOLIN HFA) 108 (90 Base) MCG/ACT inhaler Inhale 1 puff every 6 (six) hours if needed for wheezing or shortness of breath. 03/16/22     amLODipine (NORVASC) 10 MG tablet TAKE 1 TABLET (10 MG TOTAL) BY MOUTH DAILY. TO LOWER BLOOD PRESSURE 01/27/21 02/27/22  Anders Simmonds, PA-C  benzonatate (TESSALON) 100 MG capsule Take 2 capsules (200 mg total) by mouth 3 (three) times daily as needed for cough up to 7 days. Do not crush or chew. 03/16/22     cephALEXin (KEFLEX) 500 MG capsule Take 1 capsule (500 mg total) by mouth 4 (four) times daily. Patient not taking: Reported on 06/01/2021 03/05/21   Doran Clay, MD  labetalol (NORMODYNE) 100 MG tablet Take 1 tablet (100 mg total) by mouth 2 (two) times daily. To lower blood pressure and heart  rate 01/27/21 01/28/22  Anders Simmonds, PA-C  metroNIDAZOLE (FLAGYL) 500 MG tablet Take 1 tablet (500 mg total) by mouth 2 (two) times daily. 09/27/21   Warden Fillers, MD  naproxen (NAPROSYN) 500 MG tablet Take 1 tablet (500 mg total) by mouth 2 (two) times daily. 09/19/21   Carlisle Beers, FNP      Allergies    Patient has no known allergies.    Review of Systems   Review of Systems  All other systems reviewed and are negative.   Physical Exam Updated Vital Signs BP (!) 152/107   Pulse 77   Temp 98.5 F (36.9 C)   Resp 18   SpO2 97%  Physical Exam Vitals and nursing note reviewed.  Constitutional:      General: She is not in acute distress.    Appearance: Normal appearance. She is well-developed.  HENT:     Head: Normocephalic and atraumatic.  Eyes:     Conjunctiva/sclera: Conjunctivae normal.     Pupils: Pupils are equal, round, and reactive to light.  Cardiovascular:     Rate and Rhythm: Normal rate and regular rhythm.     Heart sounds: Normal heart sounds.  Pulmonary:     Effort: Pulmonary effort is normal. No respiratory distress.     Breath sounds: Normal breath sounds.  Abdominal:     General: There is no distension.  Palpations: Abdomen is soft.     Tenderness: There is no abdominal tenderness.  Musculoskeletal:        General: No deformity. Normal range of motion.     Cervical back: Normal range of motion and neck supple.  Skin:    General: Skin is warm and dry.  Neurological:     General: No focal deficit present.     Mental Status: She is alert and oriented to person, place, and time.     ED Results / Procedures / Treatments   Labs (all labs ordered are listed, but only abnormal results are displayed) Labs Reviewed  CBC WITH DIFFERENTIAL/PLATELET - Abnormal; Notable for the following components:      Result Value   WBC 12.9 (*)    Neutro Abs 9.2 (*)    All other components within normal limits  COMPREHENSIVE METABOLIC PANEL - Abnormal;  Notable for the following components:   Potassium 3.4 (*)    Glucose, Bld 115 (*)    All other components within normal limits  URINALYSIS, ROUTINE W REFLEX MICROSCOPIC - Abnormal; Notable for the following components:   APPearance HAZY (*)    Hgb urine dipstick LARGE (*)    Protein, ur 100 (*)    RBC / HPF >50 (*)    Bacteria, UA RARE (*)    All other components within normal limits  LIPASE, BLOOD  PREGNANCY, URINE  I-STAT BETA HCG BLOOD, ED ([facility], [facility], [facility] ONLY)    EKG None  Radiology No results found.  Procedures Procedures    Medications Ordered in ED Medications  ketorolac (TORADOL) 30 MG/ML injection 15 mg (15 mg Intravenous Given 04/02/22 2139)    ED Course/ Medical Decision Making/ A&P                           Medical Decision Making Amount and/or Complexity of Data Reviewed Labs: ordered.  Risk Prescription drug management.    Medical Screen Complete  This patient presented to the ED with complaint of pain.  This complaint involves an extensive number of treatment options. The initial differential diagnosis includes, but is not limited to, dysmenorrhea, bone abnormality, UTI, etc.  This presentation is: Acute, Self-Limited, Previously Undiagnosed, Uncertain Prognosis, and Complicated  Patient is presenting with complaint of lower pain.  Pain appears to be associated with initiation of recent menstrual period.  Patient's exam is not suggestive of significant pain.  Screening labs obtained without significant abnormality.  Patient does feel significantly improved with Toradol.  Repeat abdominal exam is benign  Patient now desires DC home.  She declines additional work-up including imaging (CT abdomen pelvis or ultrasound).  She does understand need for close outpatient follow-up. Strict return precautions given and understood.    Additional history obtained:  External records from outside sources obtained and reviewed including prior ED visits  and prior Inpatient records.    Lab Tests:  I ordered and personally interpreted labs.  The pertinent results include: BC, CMP, lipase, hCG, UA   Cardiac Monitoring:  The patient was maintained on a cardiac monitor.  I personally viewed and interpreted the cardiac monitor which showed an underlying rhythm of: NSR   Medicines ordered:  I ordered medication including Toradol for pain Reevaluation of the patient after these medicines showed that the patient: improved   Problem List / ED Course:  Abdominal pain   Reevaluation:  After the interventions noted above, I reevaluated the patient and found  that they have: improved  Disposition:  After consideration of the diagnostic results and the patients response to treatment, I feel that the patent would benefit from close outpatient follow-up.          Final Clinical Impression(s) / ED Diagnoses Final diagnoses:  Lower abdominal pain  Medication refill    Rx / DC Orders ED Discharge Orders          Ordered    albuterol (VENTOLIN HFA) 108 (90 Base) MCG/ACT inhaler  Every 6 hours PRN        04/02/22 2240              Valarie Merino, MD 04/02/22 2259

## 2022-04-02 NOTE — ED Provider Triage Note (Signed)
Emergency Medicine Provider Triage Evaluation Note  Alanda Colton , a 36 y.o. female  was evaluated in triage.  Pt complains of abd pain. Pain across lower abd since yesterday.  Mild nausea.  No fever, chills, dysuria, bowel/bladder changes.  LBM yesterday.  No vaginal bleeding/discharge  Review of Systems  Positive: As above Negative: As above  Physical Exam  BP (!) 174/117 (BP Location: Left Arm) Comment: Out of bp meds  Pulse 100   Temp 98.4 F (36.9 C) (Oral)   Resp 18   SpO2 99%  Gen:   Awake, no distress   Resp:  Normal effort  MSK:   Moves extremities without difficulty  Other:    Medical Decision Making  Medically screening exam initiated at 4:52 PM.  Appropriate orders placed.  Yasmina Chico was informed that the remainder of the evaluation will be completed by another provider, this initial triage assessment does not replace that evaluation, and the importance of remaining in the ED until their evaluation is complete.     Domenic Moras, PA-C 04/02/22 1653

## 2022-04-03 ENCOUNTER — Other Ambulatory Visit: Payer: Self-pay

## 2022-04-05 ENCOUNTER — Encounter: Payer: Self-pay | Admitting: Obstetrics and Gynecology

## 2022-04-07 ENCOUNTER — Other Ambulatory Visit: Payer: Self-pay

## 2022-04-07 ENCOUNTER — Other Ambulatory Visit: Payer: Self-pay | Admitting: Physician Assistant

## 2022-04-07 DIAGNOSIS — I10 Essential (primary) hypertension: Secondary | ICD-10-CM

## 2022-04-07 DIAGNOSIS — R000 Tachycardia, unspecified: Secondary | ICD-10-CM

## 2022-04-07 NOTE — Telephone Encounter (Signed)
Requested medication (s) are due for refill today - expired Rx  Requested medication (s) are on the active medication list -yes  Future visit scheduled -no  Last refill: 01/27/21  Notes to clinic: expired Rx, patient may have changed provider, attempted to call patient- left message to call office  Requested Prescriptions  Pending Prescriptions Disp Refills   amLODipine (NORVASC) 10 MG tablet 90 tablet 1    Sig: TAKE 1 TABLET (10 MG TOTAL) BY MOUTH DAILY. TO LOWER BLOOD PRESSURE     Cardiovascular: Calcium Channel Blockers 2 Failed - 04/07/2022 12:29 PM      Failed - Last BP in normal range    BP Readings from Last 1 Encounters:  04/02/22 (!) 153/98         Failed - Valid encounter within last 6 months    Recent Outpatient Visits           1 year ago Screening for diabetes mellitus   [facility] [place], [place], [place]   2 years ago Essential hypertension   [facility] [place], Shea Stakes, NP   2 years ago Essential hypertension   [facility] [place], [place], NP   3 years ago Prediabetes   [facility] [place], Cornelius Moras, RPH-CPP   3 years ago Prediabetes   [facility] [place], [place], MD              Passed - Last Heart Rate in normal range    Pulse Readings from Last 1 Encounters:  04/02/22 69          labetalol (NORMODYNE) 100 MG tablet 180 tablet 1    Sig: Take 1 tablet (100 mg total) by mouth 2 (two) times daily. To lower blood pressure and heart rate     Cardiovascular:  Beta Blockers Failed - 04/07/2022 12:29 PM      Failed - Last BP in normal range    BP Readings from Last 1 Encounters:  04/02/22 (!) 153/98         Failed - Valid encounter within last 6 months    Recent Outpatient Visits           1 year ago Screening for diabetes mellitus   [facility] [place], [place],  [place]   2 years ago Essential hypertension   [facility] [place], Shea Stakes, NP   2 years ago Essential hypertension   [facility] [place], [place], NP   3 years ago Prediabetes   [facility] [place], Cornelius Moras, RPH-CPP   3 years ago Prediabetes   [facility] [place], [place], MD              Passed - Last Heart Rate in normal range    Pulse Readings from Last 1 Encounters:  04/02/22 69            Requested Prescriptions  Pending Prescriptions Disp Refills   amLODipine (NORVASC) 10 MG tablet 90 tablet 1    Sig: TAKE 1 TABLET (10 MG TOTAL) BY MOUTH DAILY. TO LOWER BLOOD PRESSURE     Cardiovascular: Calcium Channel Blockers 2 Failed - 04/07/2022 12:29 PM      Failed - Last BP in normal range    BP Readings from Last 1 Encounters:  04/02/22 (!) 153/98         Failed - Valid encounter within last 6 months    Recent Outpatient Visits           1 year ago Screening for diabetes mellitus   [place], [place], [place]   2 years ago Essential hypertension   [place], Vernia Buff, NP   2 years ago Essential hypertension   [place], [place], NP   3 years ago Prediabetes   [place], RPH-CPP   3 years ago Prediabetes   [place], MD              Passed - Last Heart Rate in normal range    Pulse Readings from Last 1 Encounters:  04/02/22 69          labetalol (NORMODYNE) 100 MG tablet 180 tablet 1    Sig: Take 1 tablet (100 mg total) by mouth 2 (two) times daily. To lower blood pressure and heart rate     Cardiovascular:  Beta Blockers Failed - 04/07/2022 12:29 PM      Failed - Last BP in normal range    BP Readings from Last 1  Encounters:  04/02/22 (!) 153/98         Failed - Valid encounter within last 6 months    Recent Outpatient Visits           1 year ago Screening for diabetes mellitus   [place], [place], [place]   2 years ago Essential hypertension   [place], Vernia Buff, NP   2 years ago Essential hypertension   [place], [place], NP   3 years ago Prediabetes   [place], RPH-CPP   3 years ago Prediabetes   [place], MD              Passed - Last Heart Rate in normal range    Pulse Readings from Last 1 Encounters:  04/02/22 69

## 2022-04-12 ENCOUNTER — Other Ambulatory Visit (HOSPITAL_COMMUNITY): Payer: Self-pay

## 2022-04-27 ENCOUNTER — Other Ambulatory Visit (HOSPITAL_COMMUNITY): Payer: Self-pay

## 2022-04-27 ENCOUNTER — Other Ambulatory Visit: Payer: Self-pay | Admitting: Physician Assistant

## 2022-04-27 DIAGNOSIS — R000 Tachycardia, unspecified: Secondary | ICD-10-CM

## 2022-04-27 DIAGNOSIS — I10 Essential (primary) hypertension: Secondary | ICD-10-CM

## 2022-04-27 MED ORDER — AMLODIPINE BESYLATE 10 MG PO TABS
10.0000 mg | ORAL_TABLET | Freq: Every day | ORAL | 0 refills | Status: DC
  Filled 2022-04-27: qty 30, 30d supply, fill #0

## 2022-04-27 MED ORDER — LABETALOL HCL 100 MG PO TABS
100.0000 mg | ORAL_TABLET | Freq: Two times a day (BID) | ORAL | 0 refills | Status: DC
  Filled 2022-04-27: qty 60, 30d supply, fill #0

## 2022-04-27 NOTE — Telephone Encounter (Signed)
Requested medication (s) are due for refill today: Yes  Requested medication (s) are on the active medication list: Yes  Last refill:  01/27/21  Future visit scheduled: Yes  Notes to clinic:  Unable to refill per protocol, Rx expired.      Requested Prescriptions  Pending Prescriptions Disp Refills   amLODipine (NORVASC) 10 MG tablet 90 tablet 1    Sig: TAKE 1 TABLET (10 MG TOTAL) BY MOUTH DAILY. TO LOWER BLOOD PRESSURE     Cardiovascular: Calcium Channel Blockers 2 Failed - 04/27/2022  2:46 PM      Failed - Last BP in normal range    BP Readings from Last 1 Encounters:  04/02/22 (!) 153/98         Failed - Valid encounter within last 6 months    Recent Outpatient Visits           1 year ago Screening for diabetes mellitus   [facility] [place], [place], [place]   2 years ago Essential hypertension   [facility] [place], Shea Stakes, NP   2 years ago Essential hypertension   [facility] [place], [place], NP   3 years ago Prediabetes   [facility] [place], Cornelius Moras, RPH-CPP   3 years ago Prediabetes   [facility] [place], [place], MD       Future Appointments             In 2 weeks [place], Marzella Schlein, PA-C [facility]            Passed - Last Heart Rate in normal range    Pulse Readings from Last 1 Encounters:  04/02/22 69          labetalol (NORMODYNE) 100 MG tablet 180 tablet 1    Sig: Take 1 tablet (100 mg total) by mouth 2 (two) times daily. To lower blood pressure and heart rate     Cardiovascular:  Beta Blockers Failed - 04/27/2022  2:46 PM      Failed - Last BP in normal range    BP Readings from Last 1 Encounters:  04/02/22 (!) 153/98         Failed - Valid encounter within last 6 months    Recent Outpatient Visits           1 year ago Screening for diabetes mellitus    [facility] [place], [place], [place]   2 years ago Essential hypertension   [facility] [place], Shea Stakes, NP   2 years ago Essential hypertension   [facility] [place], [place], NP   3 years ago Prediabetes   [facility] [place], RPH-CPP   3 years ago Prediabetes   [facility] [place], [place], MD       Future Appointments             In 2 weeks [place], Marzella Schlein, PA-C [facility]            Passed - Last Heart Rate in normal range    Pulse Readings from Last 1 Encounters:  04/02/22 69

## 2022-04-28 ENCOUNTER — Other Ambulatory Visit (HOSPITAL_COMMUNITY): Payer: Self-pay

## 2022-04-28 ENCOUNTER — Other Ambulatory Visit: Payer: Self-pay

## 2022-04-28 MED ORDER — AMLODIPINE BESY-BENAZEPRIL HCL 5-20 MG PO CAPS
1.0000 | ORAL_CAPSULE | Freq: Every day | ORAL | 0 refills | Status: DC
  Filled 2022-04-28: qty 30, 30d supply, fill #0

## 2022-05-01 ENCOUNTER — Other Ambulatory Visit (HOSPITAL_COMMUNITY): Payer: Self-pay

## 2022-05-01 ENCOUNTER — Other Ambulatory Visit: Payer: Self-pay

## 2022-05-05 ENCOUNTER — Other Ambulatory Visit: Payer: Self-pay

## 2022-05-11 ENCOUNTER — Ambulatory Visit: Payer: BC Managed Care – PPO | Attending: Physician Assistant | Admitting: Physician Assistant

## 2022-05-11 ENCOUNTER — Encounter: Payer: Self-pay | Admitting: Physician Assistant

## 2022-05-11 ENCOUNTER — Other Ambulatory Visit: Payer: Self-pay

## 2022-05-11 VITALS — BP 151/101 | HR 88 | Wt 287.8 lb

## 2022-05-11 DIAGNOSIS — E876 Hypokalemia: Secondary | ICD-10-CM

## 2022-05-11 DIAGNOSIS — I10 Essential (primary) hypertension: Secondary | ICD-10-CM

## 2022-05-11 DIAGNOSIS — R000 Tachycardia, unspecified: Secondary | ICD-10-CM

## 2022-05-11 MED ORDER — LABETALOL HCL 100 MG PO TABS
100.0000 mg | ORAL_TABLET | Freq: Two times a day (BID) | ORAL | 1 refills | Status: DC
  Filled 2022-05-11 – 2022-10-12 (×2): qty 180, 90d supply, fill #0

## 2022-05-11 MED ORDER — AMLODIPINE BESYLATE 10 MG PO TABS
10.0000 mg | ORAL_TABLET | Freq: Every day | ORAL | 1 refills | Status: DC
  Filled 2022-05-11: qty 90, 90d supply, fill #0
  Filled 2022-06-19: qty 30, 30d supply, fill #0
  Filled 2022-10-12: qty 90, 90d supply, fill #1

## 2022-05-11 NOTE — Progress Notes (Signed)
Patient ID: Dana Allison, female   DOB: [birth year], 36 y.o.   MRN: [id number]   Dana Allison, is a 36 y.o. female  [id number]  [id number]  DOB - 07-11-1985  Chief Complaint  Patient presents with   Medication Refill   Hypertension       Subjective:   Dana Allison is a 36 y.o. female here today for BP med RF.  Stable on labetelol and amlodipine.  She works out of town and has difficulty making it to her appts.  She denies HA/CP/dizziness/SOB.    No problems updated.  ALLERGIES: No Known Allergies  PAST MEDICAL HISTORY: Past Medical History:  Diagnosis Date   Endometrial polyp 03/17/2019   Hypertension    Morbid obesity (HCC)    Pre-diabetes    per patient    MEDICATIONS AT HOME: Prior to Admission medications   Medication Sig Start Date End Date Taking? Authorizing Provider  albuterol (VENTOLIN HFA) 108 (90 Base) MCG/ACT inhaler Inhale 1 puff every 6 (six) hours if needed for wheezing or shortness of breath. 03/16/22  Yes   albuterol (VENTOLIN HFA) 108 (90 Base) MCG/ACT inhaler Inhale 1-2 puffs into the lungs every 6 (six) hours as needed for wheezing or shortness of breath. 04/02/22  Yes Wynetta Fines, MD  benzonatate (TESSALON) 100 MG capsule Take 2 capsules (200 mg total) by mouth 3 (three) times daily as needed for cough up to 7 days. Do not crush or chew. 03/16/22  Yes   cephALEXin (KEFLEX) 500 MG capsule Take 1 capsule (500 mg total) by mouth 4 (four) times daily. 03/05/21  Yes Doran Clay, MD  naproxen (NAPROSYN) 500 MG tablet Take 1 tablet (500 mg total) by mouth 2 (two) times daily. 09/19/21  Yes Carlisle Beers, FNP  amLODipine (NORVASC) 10 MG tablet Take 1 tablet (10 mg total) by mouth daily to lower blood pressure 05/11/22   Aundraya Dripps M, PA-C  labetalol (NORMODYNE) 100 MG tablet Take 1 tablet (100 mg total) by mouth 2 (two) times daily to lower blood pressure and heart rate 05/11/22   Arber Wiemers M, PA-C    ROS: Neg HEENT Neg  resp Neg GI Neg GU Neg MS Neg psych Neg neuro  Objective:   Vitals:   05/11/22 1038  BP: (!) 151/101  Pulse: 88  SpO2: 98%  Weight: 287 lb 12.8 oz (130.5 kg)   Exam General appearance : Awake, alert, not in any distress. Speech Clear. Not toxic looking HEENT: Atraumatic and Normocephalic Neck: Supple, no JVD. No cervical lymphadenopathy.  Chest: Good air entry bilaterally, CTAB.  No rales/rhonchi/wheezing CVS: S1 S2 regular, no murmurs.  Abdomen: Bowel sounds present, Non tender and not distended with no gaurding, rigidity or rebound. Extremities: B/L Lower Ext shows no edema, both legs are warm to touch Neurology: Awake alert, and oriented X 3, CN II-XII intact, Non focal Skin: No Rash  Data Review Lab Results  Component Value Date   HGBA1C 6.1 (H) 02/17/2021   HGBA1C 5.7 (A) 03/13/2019   HGBA1C 6.3 (H) 10/28/2018    Assessment & Plan   1. Sinus tachycardia - labetalol (NORMODYNE) 100 MG tablet; Take 1 tablet (100 mg total) by mouth 2 (two) times daily to lower blood pressure and heart rate  Dispense: 180 tablet; Refill: 1  2. Essential hypertension Check BP daily OOO.  If not at goal <130/85 in 3 weeks schedule an appt - labetalol (NORMODYNE) 100 MG tablet; Take 1 tablet (100 mg total) by mouth 2 (  two) times daily to lower blood pressure and heart rate  Dispense: 180 tablet; Refill: 1 - amLODipine (NORVASC) 10 MG tablet; Take 1 tablet (10 mg total) by mouth daily to lower blood pressure  Dispense: 90 tablet; Refill: 1 - Basic metabolic panel  3. Hypokalemia - Basic metabolic panel    Return in about 6 months (around 11/10/2022) for please assign PCP for blood pressure.  The patient was given clear instructions to go to ER or return to medical center if symptoms don't improve, worsen or new problems develop. The patient verbalized understanding. The patient was told to call to get lab results if they haven't heard anything in the next week.      Freeman Caldron, PA-C [facility] [place], [place]   05/11/2022, 10:50 AM

## 2022-05-11 NOTE — Patient Instructions (Signed)
Goal blood pressure is <130/85  Drink 80-100 ounces water daily

## 2022-05-12 ENCOUNTER — Other Ambulatory Visit: Payer: Self-pay

## 2022-05-12 LAB — BASIC METABOLIC PANEL
BUN/Creatinine Ratio: 11 (ref 9–23)
BUN: 10 mg/dL (ref 6–20)
CO2: 23 mmol/L (ref 20–29)
Calcium: 9.6 mg/dL (ref 8.7–10.2)
Chloride: 101 mmol/L (ref 96–106)
Creatinine, Ser: 0.93 mg/dL (ref 0.57–1.00)
Glucose: 172 mg/dL — ABNORMAL HIGH (ref 70–99)
Potassium: 3.9 mmol/L (ref 3.5–5.2)
Sodium: 139 mmol/L (ref 134–144)
eGFR: 83 mL/min/{1.73_m2} (ref >59)

## 2022-05-23 ENCOUNTER — Other Ambulatory Visit: Payer: Self-pay

## 2022-05-23 MED ORDER — PREDNISONE 10 MG (21) PO TBPK
ORAL_TABLET | ORAL | 0 refills | Status: DC
  Filled 2022-05-23: qty 21, 6d supply, fill #0

## 2022-05-24 ENCOUNTER — Other Ambulatory Visit: Payer: Self-pay

## 2022-06-19 ENCOUNTER — Other Ambulatory Visit (HOSPITAL_COMMUNITY): Payer: Self-pay

## 2022-06-20 ENCOUNTER — Other Ambulatory Visit (HOSPITAL_COMMUNITY): Payer: Self-pay

## 2022-10-12 ENCOUNTER — Other Ambulatory Visit: Payer: Self-pay

## 2022-10-12 MED ORDER — AMOXICILLIN-POT CLAVULANATE 875-125 MG PO TABS
1.0000 | ORAL_TABLET | Freq: Two times a day (BID) | ORAL | 0 refills | Status: AC
  Filled 2022-10-12: qty 14, 7d supply, fill #0

## 2022-10-19 ENCOUNTER — Other Ambulatory Visit: Payer: Self-pay

## 2022-11-01 ENCOUNTER — Other Ambulatory Visit: Payer: Self-pay

## 2022-11-01 ENCOUNTER — Other Ambulatory Visit (HOSPITAL_COMMUNITY): Payer: Self-pay

## 2022-11-01 MED ORDER — IBUPROFEN 600 MG PO TABS
600.0000 mg | ORAL_TABLET | Freq: Four times a day (QID) | ORAL | 0 refills | Status: DC | PRN
  Filled 2022-11-01: qty 30, 8d supply, fill #0

## 2022-11-08 ENCOUNTER — Encounter: Payer: Self-pay | Admitting: Physician Assistant

## 2022-11-08 ENCOUNTER — Ambulatory Visit: Payer: BC Managed Care – PPO | Attending: Physician Assistant | Admitting: Physician Assistant

## 2022-11-08 ENCOUNTER — Other Ambulatory Visit: Payer: Self-pay

## 2022-11-08 VITALS — BP 132/90 | HR 91 | Wt 297.4 lb

## 2022-11-08 DIAGNOSIS — I10 Essential (primary) hypertension: Secondary | ICD-10-CM

## 2022-11-08 DIAGNOSIS — Z6841 Body Mass Index (BMI) 40.0 and over, adult: Secondary | ICD-10-CM

## 2022-11-08 DIAGNOSIS — R000 Tachycardia, unspecified: Secondary | ICD-10-CM | POA: Diagnosis not present

## 2022-11-08 DIAGNOSIS — R7303 Prediabetes: Secondary | ICD-10-CM

## 2022-11-08 DIAGNOSIS — R590 Localized enlarged lymph nodes: Secondary | ICD-10-CM | POA: Diagnosis not present

## 2022-11-08 LAB — POCT GLYCOSYLATED HEMOGLOBIN (HGB A1C): HbA1c, POC (controlled diabetic range): 5.8 % (ref 0.0–7.0)

## 2022-11-08 LAB — GLUCOSE, POCT (MANUAL RESULT ENTRY): POC Glucose: 130 mg/dl — AB (ref 70–99)

## 2022-11-08 MED ORDER — BLOOD PRESSURE CUFF MISC
1.0000 | Freq: Every day | 0 refills | Status: AC
  Filled 2022-11-08: qty 1, fill #0

## 2022-11-08 MED ORDER — AMLODIPINE BESYLATE 10 MG PO TABS
10.0000 mg | ORAL_TABLET | Freq: Every day | ORAL | 1 refills | Status: DC
  Filled 2022-11-08: qty 90, 90d supply, fill #0

## 2022-11-08 MED ORDER — LABETALOL HCL 100 MG PO TABS
100.0000 mg | ORAL_TABLET | Freq: Two times a day (BID) | ORAL | 1 refills | Status: DC
  Filled 2022-11-08: qty 180, 90d supply, fill #0

## 2022-11-08 MED ORDER — TRULICITY 0.75 MG/0.5ML ~~LOC~~ SOAJ
0.7500 mg | SUBCUTANEOUS | 0 refills | Status: DC
  Filled 2022-11-08: qty 2, 28d supply, fill #0

## 2022-11-08 NOTE — Progress Notes (Signed)
Patient ID: Dana Allison, female   DOB: 1985/06/09, 37 y.o.   MRN: [id number]   Dana Allison, is a 37 y.o. female  WGN:[id number]  HQI:[id number]  DOB - [birth year]  Chief Complaint  Patient presents with   Hypertension       Subjective:   Dana Allison is a 37 y.o. female here today for med RF.  She had stopped taking BP meds and restarted them  about 3 weeks ago after going to try and have a tooth extracted and they couldn't do it due to htn.  She is now taking the amlodipine and labetelol.  No CP/SOB/dizziness  She would like to take an injectable to help with blood sugars and weight loss to help with her other health conditions.  She is also open to going to nutrition/weight management.  She currently does not exercise or follow healthy diet but she has started trying to drink more water.    "Knot" just below neck she noticed about 2 weeks ago.  Non-tender.  No weight loss or night sweats.  No cough.  No recent URI  No problems updated.  ALLERGIES: No Known Allergies  PAST MEDICAL HISTORY: Past Medical History:  Diagnosis Date   Endometrial polyp 03/17/2019   Hypertension    Morbid obesity (HCC)    Pre-diabetes    per patient    MEDICATIONS AT HOME: Prior to Admission medications   Medication Sig Start Date End Date Taking? Authorizing Provider  albuterol (VENTOLIN HFA) 108 (90 Base) MCG/ACT inhaler Inhale 1-2 puffs into the lungs every 6 (six) hours as needed for wheezing or shortness of breath. 04/02/22  Yes Wynetta Fines, MD  benzonatate (TESSALON) 100 MG capsule Take 2 capsules (200 mg total) by mouth 3 (three) times daily as needed for cough up to 7 days. Do not crush or chew. 03/16/22  Yes   Blood Pressure Monitoring (BLOOD PRESSURE CUFF) MISC 1 each by Does not apply route daily. 11/08/22  Yes Idora Brosious, Marzella Schlein, PA-C  cephALEXin (KEFLEX) 500 MG capsule Take 1 capsule (500 mg total) by mouth 4 (four) times daily. 03/05/21  Yes Doran Clay, MD  Dulaglutide  (TRULICITY) 0.75 MG/0.5ML SOPN Inject 0.75 mg into the skin once a week. 11/08/22  Yes Anders Simmonds, PA-C  ibuprofen (ADVIL) 600 MG tablet Take 1 tablet (600 mg total) by mouth every 6 (six) hours as needed for pain. 11/01/22  Yes   naproxen (NAPROSYN) 500 MG tablet Take 1 tablet (500 mg total) by mouth 2 (two) times daily. 09/19/21  Yes Carlisle Beers, FNP  predniSONE (STERAPRED UNI-PAK 21 TAB) 10 MG (21) TBPK tablet Take 1 tablet by mouth per package directions for 6 days 05/23/22  Yes   amLODipine (NORVASC) 10 MG tablet Take 1 tablet (10 mg total) by mouth daily to lower blood pressure 11/08/22   Georgian Co M, PA-C  labetalol (NORMODYNE) 100 MG tablet Take 1 tablet (100 mg total) by mouth 2 (two) times daily to lower blood pressure and heart rate 11/08/22   Azra Abrell, Marzella Schlein, PA-C    ROS: Neg HEENT Neg resp Neg cardiac Neg GI Neg GU Neg MS Neg psych Neg neuro  Objective:   Vitals:   11/08/22 1540 11/08/22 1548  BP: (!) 129/90 (!) 132/90  Pulse: 91   SpO2: 97%   Weight: 297 lb 6.4 oz (134.9 kg)    Exam General appearance : Awake, alert, not in any distress. Speech Clear. Not toxic looking HEENT: Atraumatic and  Normocephalic Neck: Supple, no JVD. No cervical lymphadenopathy. Just below neck and proximal to clavicle is a small and freely mobile 2 cm cystic type structure, not tender, it is soft and has rebound.  Non-tender Chest: Good air entry bilaterally, CTAB.  No rales/rhonchi/wheezing CVS: S1 S2 regular, no murmurs.  Extremities: B/L Lower Ext shows no edema, both legs are warm to touch Neurology: Awake alert, and oriented X 3, CN II-XII intact, Non focal Skin: No Rash  Data Review Lab Results  Component Value Date   HGBA1C 5.8 11/08/2022   HGBA1C 6.1 (H) 02/17/2021   HGBA1C 5.7 (A) 03/13/2019    Assessment & Plan   1. Prediabetes Improved-can try for PA trulicity and have her go for nutrition/weight management.  Avoid sugars and starchy foods -  Glucose (CBG) - HgB A1c - Basic metabolic panel - Dulaglutide (TRULICITY) 0.75 MG/0.5ML SOPN; Inject 0.75 mg into the skin once a week.  Dispense: 2 mL; Refill: 0 - Amb Ref to Medical Weight Management  2. Essential hypertension Not at goal.  Check BPs OOO - amLODipine (NORVASC) 10 MG tablet; Take 1 tablet (10 mg total) by mouth daily to lower blood pressure  Dispense: 90 tablet; Refill: 1 - labetalol (NORMODYNE) 100 MG tablet; Take 1 tablet (100 mg total) by mouth 2 (two) times daily to lower blood pressure and heart rate  Dispense: 180 tablet; Refill: 1 - Blood Pressure Monitoring (BLOOD PRESSURE CUFF) MISC; 1 each by Does not apply route daily.  Dispense: 1 each; Refill: 0  3. Sinus tachycardia - labetalol (NORMODYNE) 100 MG tablet; Take 1 tablet (100 mg total) by mouth 2 (two) times daily to lower blood pressure and heart rate  Dispense: 180 tablet; Refill: 1 - TSH - Basic metabolic panel  4. BMI 40.0-44.9, adult (HCC) - Amb Ref to Medical Weight Management   5. Enlarged lymph node in neck vs small cyst Freely mobile and not fixed-just below Left neck proximal to clavicle - US Soft Tissue Head/Neck (NON-THYROID); Future   Return in about 2 months (around 01/08/2023) for Please assign to a PCP here or [place].  The patient was given clear instructions to go to ER or return to medical center if symptoms don't improve, worsen or new problems develop. The patient verbalized understanding. The patient was told to call to get lab results if they haven't heard anything in the next week.      Georgian Co, PA-C [facility] [place], [place] [phone]   11/08/2022, 4:05 PM

## 2022-11-08 NOTE — Patient Instructions (Signed)
Goal blood pressure is an average of <130/90  Check blood pressure daily after sitting still and quiet for 5 mins with deep breathing  Drink 64 ounces water daily

## 2022-11-09 ENCOUNTER — Other Ambulatory Visit: Payer: Self-pay

## 2022-11-09 LAB — BASIC METABOLIC PANEL
BUN/Creatinine Ratio: 17 (ref 9–23)
BUN: 13 mg/dL (ref 6–20)
CO2: 23 mmol/L (ref 20–29)
Calcium: 9.7 mg/dL (ref 8.7–10.2)
Chloride: 103 mmol/L (ref 96–106)
Creatinine, Ser: 0.77 mg/dL (ref 0.57–1.00)
Glucose: 116 mg/dL — ABNORMAL HIGH (ref 70–99)
Potassium: 4 mmol/L (ref 3.5–5.2)
Sodium: 140 mmol/L (ref 134–144)
eGFR: 104 mL/min/{1.73_m2} (ref >59)

## 2022-11-09 LAB — TSH: TSH: 0.635 u[IU]/mL (ref 0.450–4.500)

## 2022-11-27 ENCOUNTER — Encounter: Payer: Self-pay | Admitting: Obstetrics and Gynecology

## 2022-11-28 ENCOUNTER — Ambulatory Visit
Admission: RE | Admit: 2022-11-28 | Discharge: 2022-11-28 | Disposition: A | Discharge status: Home / Self Care | Payer: Medicaid Other | Source: Ambulatory Visit | Attending: Physician Assistant | Admitting: Physician Assistant

## 2022-11-28 DIAGNOSIS — R590 Localized enlarged lymph nodes: Secondary | ICD-10-CM

## 2022-11-28 DIAGNOSIS — R221 Localized swelling, mass and lump, neck: Secondary | ICD-10-CM | POA: Diagnosis not present

## 2023-01-05 ENCOUNTER — Other Ambulatory Visit: Payer: Self-pay

## 2023-01-05 NOTE — Progress Notes (Signed)
   Dana Allison 27-Nov-1985 [id number]  Patient outreached by Mack Guise , PharmD Candidate on 01/05/2023.  Blood Pressure Readings: Last documented ambulatory systolic blood pressure: 132 Last documented ambulatory diastolic blood pressure: 90 Does the patient have a validated home blood pressure machine?: No They report home readings NA  Medication review was performed.   Differences from their prescribed list include: NA  The following barriers to adherence were noted: Does the patient have cost concerns?: Yes Does the patient have transportation concerns?: No Does the patient need assistance obtaining refills?: No Does the patient occassionally forget to take some of their prescribed medications?: No Does the patient feel like one/some of their medications make them feel poorly?: No Does the patient have questions or concerns about their medications?: No Does the patient have a follow up scheduled with their primary care provider/cardiologist?: Yes   Interventions: Interventions Completed: Medications were reviewed, Patient was educated on how to access home blood pressure machine  The patient has follow up scheduled:  PCP: Hoy Register, MD   Mack Guise, Student-PharmD

## 2023-01-08 ENCOUNTER — Other Ambulatory Visit: Payer: Self-pay

## 2023-01-08 ENCOUNTER — Ambulatory Visit: Payer: Medicaid Other | Attending: Family Medicine | Admitting: Family Medicine

## 2023-01-08 ENCOUNTER — Encounter: Payer: Self-pay | Admitting: Family Medicine

## 2023-01-08 DIAGNOSIS — I10 Essential (primary) hypertension: Secondary | ICD-10-CM

## 2023-01-08 DIAGNOSIS — R7303 Prediabetes: Secondary | ICD-10-CM | POA: Diagnosis not present

## 2023-01-08 DIAGNOSIS — R000 Tachycardia, unspecified: Secondary | ICD-10-CM | POA: Diagnosis not present

## 2023-01-08 MED ORDER — SEMAGLUTIDE-WEIGHT MANAGEMENT 1 MG/0.5ML ~~LOC~~ SOAJ
1.0000 mg | SUBCUTANEOUS | 3 refills | Status: DC
  Filled 2023-01-08: qty 2, fill #0
  Filled 2023-03-15 – 2023-03-29 (×2): qty 2, 28d supply, fill #0

## 2023-01-08 MED ORDER — LABETALOL HCL 100 MG PO TABS
100.0000 mg | ORAL_TABLET | Freq: Two times a day (BID) | ORAL | 1 refills | Status: DC
  Filled 2023-01-08: qty 180, 90d supply, fill #0

## 2023-01-08 MED ORDER — SEMAGLUTIDE-WEIGHT MANAGEMENT 0.25 MG/0.5ML ~~LOC~~ SOAJ
0.2500 mg | SUBCUTANEOUS | 0 refills | Status: AC
  Filled 2023-01-08: qty 2, 28d supply, fill #0

## 2023-01-08 MED ORDER — SEMAGLUTIDE-WEIGHT MANAGEMENT 0.5 MG/0.5ML ~~LOC~~ SOAJ
0.5000 mg | SUBCUTANEOUS | 0 refills | Status: AC
  Filled 2023-01-08 – 2023-02-09 (×2): qty 2, 28d supply, fill #0

## 2023-01-08 MED ORDER — AMLODIPINE BESYLATE 10 MG PO TABS
10.0000 mg | ORAL_TABLET | Freq: Every day | ORAL | 1 refills | Status: DC
  Filled 2023-01-08 – 2023-02-09 (×2): qty 90, 90d supply, fill #0

## 2023-01-08 NOTE — Progress Notes (Signed)
Subjective:  Patient ID: Dana Allison, female    DOB: [birth year]  Age: 37 y.o. MRN: [id number]  CC: Medical Management of Chronic Issues   HPI Dana Allison is a 37 y.o. year old female with a history of pretension, prediabetes.  Interval History: Discussed the use of AI scribe software for clinical note transcription with the patient, who gave verbal consent to proceed.  She reports adherence to her antihypertensive regimen, but admit to missing a dose today due to a busy work schedule. She has been working two jobs, which involves a lot of walking. She has been making dietary changes, including reducing sugar intake and drinking more water, to manage her prediabetes. Her last HbA1c was 5.8.  Her primary concern today is weight management. She has been trying to lose weight by reducing  sugar intake and increasing physical activity through work. She reports sometimes eating late due to her work schedule. She was previously referred to a weight management clinic and prescribed Trulicity, but they have not heard back from the clinic and Trulicity was not covered by her Medicaid. She expresses a strong desire to lose weight and is open to trying [facility].        Past Medical History:  Diagnosis Date   Endometrial polyp 03/17/2019   Hypertension    Morbid obesity (HCC)    Pre-diabetes    per patient    Past Surgical History:  Procedure Laterality Date   HYSTEROSCOPY WITH D & C N/A 04/08/2019   Procedure: DILATATION AND CURETTAGE /HYSTEROSCOPY WITH ENDOMETRIAL POLYPECTOMY;  Surgeon: Hermina Staggers, MD;  Location: [facility] OR;  Service: Gynecology;  Laterality: N/A;   NO PAST SURGERIES      Family History  Problem Relation Age of Onset   Hypertension Mother    Diabetes Mother    Diabetes Father    Hypertension Sister    Hypertension Sister     Social History   Socioeconomic History   Marital status: Single    Spouse name: Not on file   Number of children: Not on file   Years  of education: Not on file   Highest education level: Not on file  Occupational History   Not on file  Tobacco Use   Smoking status: Never   Smokeless tobacco: Never  Vaping Use   Vaping status: Never Used  Substance and Sexual Activity   Alcohol use: Yes    Comment: occasionally   Drug use: No   Sexual activity: Not Currently    Birth control/protection: None  Other Topics Concern   Not on file  Social History Narrative   ** Merged History Encounter **       Social Determinants of Health   Financial Resource Strain: Not on file  Food Insecurity: No Food Insecurity (09/21/2021)   Hunger Vital Sign    Worried About Running Out of Food in the Last Year: Never true    Ran Out of Food in the Last Year: Never true  Transportation Needs: No Transportation Needs (09/21/2021)   PRAPARE - Administrator, Civil Service (Medical): No    Lack of Transportation (Non-Medical): No  Physical Activity: Not on file  Stress: Not on file  Social Connections: Not on file    No Known Allergies  Outpatient Medications Prior to Visit  Medication Sig Dispense Refill   albuterol (VENTOLIN HFA) 108 (90 Base) MCG/ACT inhaler Inhale 1-2 puffs into the lungs every 6 (six) hours as needed for wheezing or  shortness of breath. (Patient not taking: Reported on 01/08/2023) 6.7 g 1   benzonatate (TESSALON) 100 MG capsule Take 2 capsules (200 mg total) by mouth 3 (three) times daily as needed for cough up to 7 days. Do not crush or chew. (Patient not taking: Reported on 01/08/2023) 60 capsule 0   Blood Pressure Monitoring (BLOOD PRESSURE CUFF) MISC 1 each by Does not apply route daily. (Patient not taking: Reported on 01/08/2023) 1 each 0   cephALEXin (KEFLEX) 500 MG capsule Take 1 capsule (500 mg total) by mouth 4 (four) times daily. (Patient not taking: Reported on 01/08/2023) 20 capsule 0   ibuprofen (ADVIL) 600 MG tablet Take 1 tablet (600 mg total) by mouth every 6 (six) hours as needed for pain.  (Patient not taking: Reported on 01/08/2023) 30 tablet 0   naproxen (NAPROSYN) 500 MG tablet Take 1 tablet (500 mg total) by mouth 2 (two) times daily. (Patient not taking: Reported on 01/08/2023) 30 tablet 0   predniSONE (STERAPRED UNI-PAK 21 TAB) 10 MG (21) TBPK tablet Take 1 tablet by mouth per package directions for 6 days (Patient not taking: Reported on 01/08/2023) 21 tablet 0   amLODipine (NORVASC) 10 MG tablet Take 1 tablet (10 mg total) by mouth daily to lower blood pressure 90 tablet 1   Dulaglutide (TRULICITY) 0.75 MG/0.5ML SOPN Inject 0.75 mg into the skin once a week. (Patient not taking: Reported on 01/08/2023) 2 mL 0   labetalol (NORMODYNE) 100 MG tablet Take 1 tablet (100 mg total) by mouth 2 (two) times daily to lower blood pressure and heart rate 180 tablet 1   No facility-administered medications prior to visit.     ROS Review of Systems  Constitutional:  Negative for activity change and appetite change.  HENT:  Negative for sinus pressure and sore throat.   Respiratory:  Negative for chest tightness, shortness of breath and wheezing.   Cardiovascular:  Negative for chest pain and palpitations.  Gastrointestinal:  Negative for abdominal distention, abdominal pain and constipation.  Genitourinary: Negative.   Musculoskeletal: Negative.   Psychiatric/Behavioral:  Negative for behavioral problems and dysphoric mood.     Objective:  BP (!) 141/95   Pulse 84   Ht 5\' 8"  (1.727 m)   Wt 297 lb (134.7 kg)   SpO2 99%   BMI 45.16 kg/m      01/08/2023    1:39 PM 11/08/2022    3:48 PM 11/08/2022    3:40 PM  BP/Weight  Systolic BP 141 132 129  Diastolic BP 95 90 90  Wt. (Lbs) 297  297.4  BMI 45.16 kg/m2  45.22 kg/m2      Physical Exam Constitutional:      Appearance: She is well-developed.  Cardiovascular:     Rate and Rhythm: Normal rate.     Heart sounds: Normal heart sounds. No murmur heard. Pulmonary:     Effort: Pulmonary effort is normal.     Breath sounds:  Normal breath sounds. No wheezing or rales.  Chest:     Chest wall: No tenderness.  Abdominal:     General: Bowel sounds are normal. There is no distension.     Palpations: Abdomen is soft. There is no mass.     Tenderness: There is no abdominal tenderness.  Musculoskeletal:        General: Normal range of motion.     Right lower leg: No edema.     Left lower leg: No edema.  Neurological:  Mental Status: She is alert and oriented to person, place, and time.  Psychiatric:        Mood and Affect: Mood normal.        Latest Ref Rng & Units 11/08/2022    4:12 PM 05/11/2022   10:55 AM 04/02/2022    5:02 PM  CMP  Glucose 70 - 99 mg/dL 161  096  045   BUN 6 - 20 mg/dL 13  10  11    Creatinine 0.57 - 1.00 mg/dL 4.09  8.11  9.14   Sodium 134 - 144 mmol/L 140  139  140   Potassium 3.5 - 5.2 mmol/L 4.0  3.9  3.4   Chloride 96 - 106 mmol/L 103  101  109   CO2 20 - 29 mmol/L 23  23  23    Calcium 8.7 - 10.2 mg/dL 9.7  9.6  9.2   Total Protein 6.5 - 8.1 g/dL   8.1   Total Bilirubin 0.3 - 1.2 mg/dL   0.7   Alkaline Phos 38 - 126 U/L   69   AST 15 - 41 U/L   29   ALT 0 - 44 U/L   37     Lipid Panel     Component Value Date/Time   CHOL 173 02/17/2021 1510   TRIG 68 02/17/2021 1510   HDL 40 02/17/2021 1510   CHOLHDL 4.3 02/17/2021 1510   LDLCALC 120 (H) 02/17/2021 1510    CBC    Component Value Date/Time   WBC 12.9 (H) 04/02/2022 1702   RBC 4.29 04/02/2022 1702   HGB 13.4 04/02/2022 1702   HCT 39.4 04/02/2022 1702   PLT 301 04/02/2022 1702   MCV 91.8 04/02/2022 1702   MCV 84.9 06/05/2012 1914   MCH 31.2 04/02/2022 1702   MCHC 34.0 04/02/2022 1702   RDW 13.0 04/02/2022 1702   LYMPHSABS 2.6 04/02/2022 1702   MONOABS 0.9 04/02/2022 1702   EOSABS 0.1 04/02/2022 1702   BASOSABS 0.1 04/02/2022 1702    Lab Results  Component Value Date   HGBA1C 5.8 11/08/2022    Assessment & Plan:      Prediabetes HbA1c 5.8. Patient reports dietary modifications and increased  physical activity due to work. -Continue lifestyle modifications. -I have provided information for the weight management clinic -Will initiate Wegovy and titrate up to 1 mg  Hypertension Slightly elevated blood pressure at today's visit, but patient had not taken her medication yet. -Continue current antihypertensive regimen. -Check blood pressure at next visit. -Counseled on blood pressure goal of less than 130/80, low-sodium, DASH diet, medication compliance, 150 minutes of moderate intensity exercise per week. Discussed medication compliance, adverse effects.   Morbid obesity Patient expresses desire to lose weight. Previous prescription for Trulicity not covered by insurance. -Prescribe [id number], starting at 0.25mg  once weekly for 4 weeks, then increasing to 0.5mg  and 1mg  as tolerated. -Follow up with weight management clinic.    Follow-up in 3 months to reassess blood pressure control and weight management progress.          Meds ordered this encounter  Medications   amLODipine (NORVASC) 10 MG tablet    Sig: Take 1 tablet (10 mg total) by mouth daily to lower blood pressure    Dispense:  90 tablet    Refill:  1   labetalol (NORMODYNE) 100 MG tablet    Sig: Take 1 tablet (100 mg total) by mouth 2 (two) times daily to lower blood pressure and heart rate  Dispense:  180 tablet    Refill:  1   Semaglutide-Weight Management 0.25 MG/0.5ML SOAJ    Sig: Inject 0.25 mg into the skin once a week for 28 days. then increase to 0.5 mg    Dispense:  2 mL    Refill:  0   Semaglutide-Weight Management 0.5 MG/0.5ML SOAJ    Sig: Inject 0.5 mg into the skin once a week for 28 days. Then increase to 1 mg    Dispense:  2 mL    Refill:  0   Semaglutide-Weight Management 1 MG/0.5ML SOAJ    Sig: Inject 1 mg into the skin once a week.    Dispense:  2 mL    Refill:  3    Follow-up: Return in about 3 months (around 04/10/2023) for Chronic medical conditions.       Hoy Register,  MD, FAAFP. [facility] [place], [place] [phone]   01/08/2023, 3:02 PM

## 2023-01-08 NOTE — Patient Instructions (Signed)
Sent Referral to [facility] [phone] [place], [place] [address] [phone] Fax [phone]

## 2023-01-09 ENCOUNTER — Other Ambulatory Visit: Payer: Self-pay

## 2023-01-10 ENCOUNTER — Other Ambulatory Visit: Payer: Self-pay

## 2023-01-22 ENCOUNTER — Other Ambulatory Visit: Payer: Self-pay

## 2023-01-31 ENCOUNTER — Encounter: Payer: Self-pay | Admitting: Obstetrics and Gynecology

## 2023-01-31 ENCOUNTER — Ambulatory Visit (INDEPENDENT_AMBULATORY_CARE_PROVIDER_SITE_OTHER): Payer: Medicaid Other | Admitting: Obstetrics and Gynecology

## 2023-01-31 ENCOUNTER — Other Ambulatory Visit (HOSPITAL_COMMUNITY)
Admission: RE | Admit: 2023-01-31 | Discharge: 2023-01-31 | Disposition: A | Discharge status: Home / Self Care | Payer: Medicaid Other | Source: Ambulatory Visit | Attending: Obstetrics and Gynecology | Admitting: Obstetrics and Gynecology

## 2023-01-31 ENCOUNTER — Other Ambulatory Visit: Payer: Self-pay

## 2023-01-31 VITALS — BP 177/97 | HR 100 | Ht 68.0 in | Wt 294.0 lb

## 2023-01-31 DIAGNOSIS — N938 Other specified abnormal uterine and vaginal bleeding: Secondary | ICD-10-CM | POA: Diagnosis not present

## 2023-01-31 DIAGNOSIS — D069 Carcinoma in situ of cervix, unspecified: Secondary | ICD-10-CM | POA: Diagnosis not present

## 2023-01-31 DIAGNOSIS — Z1330 Encounter for screening examination for mental health and behavioral disorders, unspecified: Secondary | ICD-10-CM

## 2023-01-31 NOTE — Progress Notes (Signed)
  CC: menstrual issues Subjective:    Patient ID: Dana Allison, female    DOB: 1985-10-18, 37 y.o.   MRN: [id number]  HPI Pt seen for follow up regarding irregular bleeding.  Pt will have menses and after completion will have have another short course of bleeding 2-3 days after.  Pt does have a history of polypoid uterine material, but also has had a hysteroscopy D and C for treatment.  Pt has just initiated GLP-1 medication to aid in weight loss.Pt also needs 24 month pap s/p LEEP for CIN 3.  Last pap ASCUS with negative HPV.   Review of Systems     Objective:   Physical Exam Vitals:   01/31/23 1344 01/31/23 1436  BP: (!) 157/107 (!) 177/97  Pulse: 100    SSE: pap taken without incident.  Mild cervical stenosis noted.      Assessment & Plan:   1. DUB (dysfunctional uterine bleeding) Pt not a good candidate for OCP due to uncontrolled hypertension.   Pt advised to follow up with PCP and to improve compliance with BP meds. Will check pelvic ultrasound to eval endometrial stripe or any recurrence for polyps. Continue weight loss, it may aid in improving menstrual issues. - US PELVIC COMPLETE WITH TRANSVAGINAL; Future  2. Severe dysplasia of cervix (CIN III) Repap done today.  If negative HPV, repap in 3 years per guidelines - Cytology - PAP  F/u in 2 months to go over ultrasound and eval weight loss, current weight 294 pounds  I spent 20 minutes dedicated to the care of this patient including previsit review of records, face to face time with the patient discussing treatment options and post visit testing.   Warden Fillers, MD Faculty Attending, Center for [facility]

## 2023-01-31 NOTE — Progress Notes (Signed)
Pt message: 11/27/22  Good morning, i just have some concerned thoughts about my menstrual cycle. For the past couple of months when it come on it stops for 2 days then after 3 days it starts back. This month it came back dark blood. It's not heavy. I just would like to know what I should do.

## 2023-02-05 ENCOUNTER — Encounter: Payer: Self-pay | Admitting: Obstetrics and Gynecology

## 2023-02-07 ENCOUNTER — Ambulatory Visit (HOSPITAL_COMMUNITY)
Admission: RE | Admit: 2023-02-07 | Discharge: 2023-02-07 | Disposition: A | Discharge status: Home / Self Care | Payer: Medicaid Other | Source: Ambulatory Visit | Attending: Obstetrics and Gynecology | Admitting: Obstetrics and Gynecology

## 2023-02-07 DIAGNOSIS — N926 Irregular menstruation, unspecified: Secondary | ICD-10-CM | POA: Diagnosis not present

## 2023-02-07 DIAGNOSIS — N938 Other specified abnormal uterine and vaginal bleeding: Secondary | ICD-10-CM | POA: Insufficient documentation

## 2023-02-12 ENCOUNTER — Other Ambulatory Visit: Payer: Self-pay

## 2023-02-16 LAB — CYTOLOGY - PAP
Adequacy: ABSENT
Diagnosis: NEGATIVE

## 2023-03-05 ENCOUNTER — Other Ambulatory Visit: Payer: Self-pay

## 2023-03-05 DIAGNOSIS — Z20822 Contact with and (suspected) exposure to covid-19: Secondary | ICD-10-CM | POA: Diagnosis not present

## 2023-03-05 DIAGNOSIS — R059 Cough, unspecified: Secondary | ICD-10-CM | POA: Diagnosis not present

## 2023-03-05 DIAGNOSIS — J069 Acute upper respiratory infection, unspecified: Secondary | ICD-10-CM | POA: Diagnosis not present

## 2023-03-05 MED ORDER — FLUTICASONE PROPIONATE 50 MCG/ACT NA SUSP
1.0000 | Freq: Every day | NASAL | 0 refills | Status: DC
  Filled 2023-03-05: qty 16, 30d supply, fill #0

## 2023-03-05 MED ORDER — IBUPROFEN 800 MG PO TABS
800.0000 mg | ORAL_TABLET | Freq: Three times a day (TID) | ORAL | 0 refills | Status: DC | PRN
  Filled 2023-03-05: qty 60, 20d supply, fill #0

## 2023-03-05 MED ORDER — PROMETHAZINE-DM 6.25-15 MG/5ML PO SYRP
10.0000 mL | ORAL_SOLUTION | Freq: Every day | ORAL | 0 refills | Status: DC
  Filled 2023-03-05: qty 180, 5d supply, fill #0

## 2023-03-05 MED ORDER — FEXOFENADINE-PSEUDOEPHED ER 60-120 MG PO TB12: 1.0000 | ORAL_TABLET | Freq: Two times a day (BID) | ORAL | 11 refills | Status: DC

## 2023-03-08 ENCOUNTER — Other Ambulatory Visit: Payer: Self-pay

## 2023-03-16 ENCOUNTER — Other Ambulatory Visit: Payer: Self-pay

## 2023-03-28 ENCOUNTER — Other Ambulatory Visit: Payer: Self-pay

## 2023-03-29 ENCOUNTER — Other Ambulatory Visit: Payer: Self-pay

## 2023-03-29 ENCOUNTER — Other Ambulatory Visit: Payer: Self-pay | Admitting: *Deleted

## 2023-03-29 NOTE — Patient Instructions (Signed)
Dana Allison,   Thank you for taking time to speak with me today about care coordination and care management services available to you at no cost as part of your Medicaid benefit. These services are voluntary. Our team is available to provide assistance regarding your health care needs at any time. Please do not hesitate to reach out to me if we can be of service to you at any time in the future.   Estanislado Emms RN, BSN [place]  [facility] RN Care Coordinator [phone]

## 2023-03-29 NOTE — Patient Outreach (Signed)
Dana Allison was referred to the [facility] Managed Care High Risk team for assistance with care coordination and care management services. Care coordination/care management services as part of the Medicaid benefit was offered to the patient today. The patient declined assistance offered today.   Plan: The Medicaid Managed Care High Risk team is available at any time in the future to assist with care coordination/care management services upon referral.   Estanislado Emms RN, BSN [place]  [facility] RN Care Coordinator [phone]

## 2023-03-30 ENCOUNTER — Other Ambulatory Visit: Payer: Self-pay

## 2023-03-30 DIAGNOSIS — R030 Elevated blood-pressure reading, without diagnosis of hypertension: Secondary | ICD-10-CM | POA: Diagnosis not present

## 2023-03-30 DIAGNOSIS — M5442 Lumbago with sciatica, left side: Secondary | ICD-10-CM | POA: Diagnosis not present

## 2023-03-30 MED ORDER — CYCLOBENZAPRINE HCL 5 MG PO TABS
ORAL_TABLET | ORAL | 0 refills | Status: DC
  Filled 2023-03-30: qty 60, 10d supply, fill #0

## 2023-04-02 ENCOUNTER — Ambulatory Visit (HOSPITAL_COMMUNITY): Payer: Medicaid Other

## 2023-04-02 ENCOUNTER — Other Ambulatory Visit: Payer: Self-pay

## 2023-04-02 ENCOUNTER — Ambulatory Visit (HOSPITAL_COMMUNITY)
Admission: RE | Admit: 2023-04-02 | Discharge: 2023-04-02 | Disposition: A | Discharge status: Home / Self Care | Payer: Medicaid Other | Source: Ambulatory Visit | Attending: Family Medicine | Admitting: Family Medicine

## 2023-04-02 ENCOUNTER — Encounter (HOSPITAL_COMMUNITY): Payer: Self-pay

## 2023-04-02 VITALS — BP 161/103 | HR 95 | Temp 98.9°F | Resp 16 | Ht 68.0 in | Wt 294.0 lb

## 2023-04-02 DIAGNOSIS — M5442 Lumbago with sciatica, left side: Secondary | ICD-10-CM | POA: Diagnosis not present

## 2023-04-02 DIAGNOSIS — I10 Essential (primary) hypertension: Secondary | ICD-10-CM | POA: Diagnosis not present

## 2023-04-02 MED ORDER — KETOROLAC TROMETHAMINE 30 MG/ML IJ SOLN
30.0000 mg | Freq: Once | INTRAMUSCULAR | Status: AC
  Administered 2023-04-02: 30 mg via INTRAMUSCULAR

## 2023-04-02 MED ORDER — KETOROLAC TROMETHAMINE 30 MG/ML IJ SOLN
INTRAMUSCULAR | Status: AC
  Filled 2023-04-02: qty 1

## 2023-04-02 MED ORDER — PREDNISONE 20 MG PO TABS
20.0000 mg | ORAL_TABLET | Freq: Every day | ORAL | 0 refills | Status: AC
  Filled 2023-04-02: qty 10, 10d supply, fill #0

## 2023-04-02 NOTE — Discharge Instructions (Addendum)
You may continue to take the Flexeril to help with pain at rest.  I am starting on prednisone 20 mg daily for 5 days to decrease the inflammation that is causing him to experience the low back pain. Take the medication with food.  Once her x-ray has resulted we will notify you via my chart if any additional follow-up is warranted.  I have initially reviewed the x-ray and I do not see any misalignment or any protrusion of disk of the spine.  However the radiologist will review and indicate if any findings differ from what I see on x-ray.  Recommend use of heating pad and warm bath soaks to help reduce formation that is causing back pain.

## 2023-04-02 NOTE — ED Provider Notes (Signed)
[facility]    CSN: [id number] Arrival date & time: 04/02/23  1500      History   Chief Complaint Chief Complaint  Patient presents with   Back Pain    Entered by patient    HPI Dana Allison is a 37 y.o. female.   Patient with a recent history of acute low back pain with sciatica was evaluated at a different urgent care on 03/29/2020.  She was treated with Flexeril and presents here today reporting that her back pain has not improved.  Of note patient had elevated blood pressure during her recent visit at another urgent care and currently she is hypertensive at 161/103.  Patient is currently prescribed amlodipine and labetalol again endorses that she has both medications at home.  She denies any symptoms of chest pain, shortness of breath, headache or visual changes.  Past Medical History:  Diagnosis Date   Endometrial polyp 03/17/2019   Hypertension    Morbid obesity (HCC)    Pre-diabetes    per patient    Patient Active Problem List   Diagnosis Date Noted   BMI 40.0-44.9, adult (HCC) 09/21/2021   Amenorrhea, secondary 09/21/2021   Malpositioned intrauterine device (IUD) 03/28/2021   Severe dysplasia of cervix (CIN III) 12/20/2020   LGSIL on Pap smear of cervix 11/01/2020   Morbid obesity (HCC)    Hypertension    Pre-diabetes    DUB (dysfunctional uterine bleeding) 09/19/2018   OBESITY, NOS 07/26/2006    Past Surgical History:  Procedure Laterality Date   HYSTEROSCOPY WITH D & C N/A 04/08/2019   Procedure: DILATATION AND CURETTAGE /HYSTEROSCOPY WITH ENDOMETRIAL POLYPECTOMY;  Surgeon: Hermina Staggers, MD;  Location: [facility];  Service: Gynecology;  Laterality: N/A;   NO PAST SURGERIES      OB History     Gravida  0   Para  0   Term  0   Preterm  0   AB  0   Living  0      SAB  0   IAB  0   Ectopic  0   Multiple  0   Live Births  0            Home Medications    Prior to Admission medications   Medication Sig Start Date End  Date Taking? Authorizing Provider  predniSONE (DELTASONE) 20 MG tablet Take 1 tablet (20 mg total) by mouth daily with breakfast. 04/02/23  Yes Bing Neighbors, NP  amLODipine (NORVASC) 10 MG tablet Take 1 tablet (10 mg total) by mouth daily to lower blood pressure 01/08/23   Hoy Register, MD  Blood Pressure Monitoring (BLOOD PRESSURE CUFF) MISC 1 each by Does not apply route daily. Patient not taking: Reported on 01/08/2023 11/08/22   Anders Simmonds, PA-C  cyclobenzaprine (FLEXERIL) 5 MG tablet Take 1-2 tablets (5-10 mg dose) by mouth 3 (three) times a day as needed for Muscle spasms for up to 10 days. 03/30/23     labetalol (NORMODYNE) 100 MG tablet Take 1 tablet (100 mg total) by mouth 2 (two) times daily to lower blood pressure and heart rate 01/08/23   Hoy Register, MD  Semaglutide-Weight Management 1 MG/0.5ML SOAJ Inject 1 mg into the skin once a week. 03/07/23   Hoy Register, MD    Family History Family History  Problem Relation Age of Onset   Hypertension Mother    Diabetes Mother    Diabetes Father    Hypertension Sister  Hypertension Sister     Social History Social History   Tobacco Use   Smoking status: Never   Smokeless tobacco: Never  Vaping Use   Vaping status: Never Used  Substance Use Topics   Alcohol use: Yes    Comment: occasionally   Drug use: No     Allergies   Patient has no known allergies.   Review of Systems Review of Systems  Musculoskeletal:  Positive for back pain.     Physical Exam Triage Vital Signs ED Triage Vitals  Encounter Vitals Group     BP 04/02/23 1528 (!) 161/103     Systolic BP Percentile --      Diastolic BP Percentile --      Pulse Rate 04/02/23 1528 95     Resp 04/02/23 1528 16     Temp 04/02/23 1528 98.9 F (37.2 C)     Temp Source 04/02/23 1528 Oral     SpO2 04/02/23 1528 98 %     Weight 04/02/23 1527 294 lb (133.4 kg)     Height 04/02/23 1527 5\' 8"  (1.727 m)     Head Circumference --      Peak Flow --       Pain Score 04/02/23 1527 10     Pain Loc --      Pain Education --      Exclude from Growth Chart --    No data found.  Updated Vital Signs BP (!) 161/103 (BP Location: Right Arm)   Pulse 95   Temp 98.9 F (37.2 C) (Oral)   Resp 16   Ht 5\' 8"  (1.727 m)   Wt 294 lb (133.4 kg)   LMP 03/24/2023 (Approximate)   SpO2 98%   BMI 44.70 kg/m   Visual Acuity Right Eye Distance:   Left Eye Distance:   Bilateral Distance:    Right Eye Near:   Left Eye Near:    Bilateral Near:     Physical Exam Vitals reviewed.  Constitutional:      Appearance: Normal appearance. She is not ill-appearing.  HENT:     Head: Normocephalic and atraumatic.     Nose: Nose normal.  Eyes:     Extraocular Movements: Extraocular movements intact.     Conjunctiva/sclera: Conjunctivae normal.     Pupils: Pupils are equal, round, and reactive to light.  Cardiovascular:     Rate and Rhythm: Normal rate and regular rhythm.  Pulmonary:     Effort: Pulmonary effort is normal.     Breath sounds: Normal breath sounds.  Musculoskeletal:        General: Normal range of motion.     Cervical back: Normal range of motion and neck supple.     Lumbar back: Tenderness present. No bony tenderness. Negative right straight leg raise test.  Skin:    General: Skin is warm and dry.  Neurological:     General: No focal deficit present.     Mental Status: She is alert.      UC Treatments / Results  Labs (all labs ordered are listed, but only abnormal results are displayed) Labs Reviewed - No data to display  EKG   Radiology No results found.  Procedures Procedures (including critical care time)  Medications Ordered in UC Medications  ketorolac (TORADOL) 30 MG/ML injection 30 mg (30 mg Intramuscular Given 04/02/23 1552)    Initial Impression / Assessment and Plan / UC Course  I have reviewed the triage vital signs and the nursing  notes.  Pertinent labs & imaging results that were available during  my care of the patient were reviewed by me and considered in my medical decision making (see chart for details).    Acute left-sided low back pain with left sided sciatica, new problem, no focal neurological abnormal is noted on exam.  Toradol 30 mg IM given here in clinic.  Patient will continue management of pain with muscle relaxers.  Treatment of inflammation with prednisone 20 mg daily for 5 days.  Discussed options of imaging, the patient would like to have an x-ray completed here today as this is a new problem.  Elevated blood pressure with a diagnosis of hypertension, uncontrolled, resume amlodipine and labetalol as prescribed by PCP.  Keep follow up with PCP if blood pressure readings do not normalize with medication.  Goal blood pressure reading should be less than 130/90.  Final Clinical Impressions(s) / UC Diagnoses   Final diagnoses:  Acute left-sided low back pain with left-sided sciatica  Elevated blood pressure reading in office with diagnosis of hypertension     Discharge Instructions      You may continue to take the Flexeril to help with pain at rest.  I am starting on prednisone 20 mg daily for 5 days to decrease the inflammation that is causing him to experience the low back pain. Take the medication with food.  Once her x-ray has resulted we will notify you via my chart if any additional follow-up is warranted.  Recommend use of heating pad and warm bath soaks to help reduce formation that is causing back pain.     ED Prescriptions     Medication Sig Dispense Auth. Provider   predniSONE (DELTASONE) 20 MG tablet Take 1 tablet (20 mg total) by mouth daily with breakfast. 10 tablet Bing Neighbors, NP      PDMP not reviewed this encounter.   Bing Neighbors, NP 04/02/23 1606

## 2023-04-02 NOTE — ED Triage Notes (Signed)
Patient here today with c/o LB X 1 week. She has tried taking muscle relaxers with no relief.

## 2023-04-02 NOTE — Progress Notes (Signed)
X-ray reviewed by radiology and shows no abnormal findings involving the low back.  Continue with treatment recommendations of symptoms are consistent with that of acute low back pain with sciatica.  If no improvement follow up with provider listed on discharge paperwork.  Joaquin Courts, NP

## 2023-04-05 ENCOUNTER — Ambulatory Visit (INDEPENDENT_AMBULATORY_CARE_PROVIDER_SITE_OTHER): Payer: Medicaid Other | Admitting: Physician Assistant

## 2023-04-05 ENCOUNTER — Other Ambulatory Visit: Payer: Self-pay

## 2023-04-05 ENCOUNTER — Encounter: Payer: Self-pay | Admitting: Physician Assistant

## 2023-04-05 DIAGNOSIS — M5440 Lumbago with sciatica, unspecified side: Secondary | ICD-10-CM

## 2023-04-05 DIAGNOSIS — M545 Low back pain, unspecified: Secondary | ICD-10-CM | POA: Insufficient documentation

## 2023-04-05 MED ORDER — TRAMADOL HCL 50 MG PO TABS
50.0000 mg | ORAL_TABLET | Freq: Four times a day (QID) | ORAL | 0 refills | Status: AC | PRN
Start: 2023-04-05 — End: ?
  Filled 2023-04-05: qty 30, 8d supply, fill #0

## 2023-04-05 NOTE — Progress Notes (Signed)
Office Visit Note   Patient: Dana Allison           Date of Birth: 03/02/1986           MRN: [id number] Visit Date: 04/05/2023              Requested by: Hoy Register, MD [address] [place],  [place] [address] PCP: Hoy Register, MD   Assessment & Plan: Visit Diagnoses:  1. Acute bilateral low back pain with sciatica, sciatica laterality unspecified     Plan: Dana Allison is a pleasant 37 year old woman with an acute history of 2 weeks of severe back pain radiating down her left leg.  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  She has been seen by 2 urgent cares.  She was placed on Flexeril which did not help her at all except made her sleepy.  She is also currently taking 20 mg of prednisone.  She denies any saddle paresthesias any loss of bowel or bladder control.  She is very cute and cannot get comfortable.  She has gotten shots of steroid in addition to oral steroid and Flexeril.  I will give her a small amount of tramadol and restrict her work to 5 pounds of lifting.  Would also try to go forward and get an MRI as I think she is significantly uncomfortable  Follow-Up Instructions: After MRI  Orders:  No orders of the defined types were placed in this encounter.  Meds ordered this encounter  Medications   traMADol (ULTRAM) 50 MG tablet    Sig: Take 1 tablet (50 mg total) by mouth every 6 (six) hours as needed.    Dispense:  30 tablet    Refill:  0      Procedures: No procedures performed   Clinical Data: No additional findings.   Subjective: Chief Complaint  Patient presents with   Lower Back - Pain    HPI patient is a 37 year old woman with a 2-week history of low back pain.  No injury.  Pain is mostly on the left side and goes on the left foot.  She has been seen in to urgent care is 1 of which gave her a muscle relaxant which really did not help.  3 days ago she was given a prescription for prednisone  20 mg  Review of Systems  All other systems reviewed and are negative.    Objective: Vital Signs: LMP 03/24/2023 (Approximate)   Physical Exam Constitutional:      Appearance: Normal appearance.  Pulmonary:     Effort: Pulmonary effort is normal.  Skin:    General: Skin is warm and dry.  Neurological:     General: No focal deficit present.     Mental Status: She is alert and oriented to person, place, and time.  Psychiatric:        Mood and Affect: Mood normal.        Behavior: Behavior normal.     Ortho Exam Examination of the low back she has no step-off deformity she has no warmth no redness.  She does have pain radiating down her left buttock to her left leg and foot.  She is able to dorsiflex plantarflex extend and flex her legs though limited by pain.  Not actually weakness.  She does have a positive straight leg raise on the left Specialty Comments:  No specialty comments available.  Imaging: No results found.   PMFS  History: Patient Active Problem List   Diagnosis Date Noted   Low back pain 04/05/2023   BMI 40.0-44.9, adult (HCC) 09/21/2021   Amenorrhea, secondary 09/21/2021   Malpositioned intrauterine device (IUD) 03/28/2021   Severe dysplasia of cervix (CIN III) 12/20/2020   LGSIL on Pap smear of cervix 11/01/2020   Morbid obesity (HCC)    Hypertension    Pre-diabetes    DUB (dysfunctional uterine bleeding) 09/19/2018   OBESITY, NOS 07/26/2006   Past Medical History:  Diagnosis Date   Endometrial polyp 03/17/2019   Hypertension    Morbid obesity (HCC)    Pre-diabetes    per patient    Family History  Problem Relation Age of Onset   Hypertension Mother    Diabetes Mother    Diabetes Father    Hypertension Sister    Hypertension Sister     Past Surgical History:  Procedure Laterality Date   HYSTEROSCOPY WITH D & C N/A 04/08/2019   Procedure: DILATATION AND CURETTAGE /HYSTEROSCOPY WITH ENDOMETRIAL POLYPECTOMY;  Surgeon: Hermina Staggers,  MD;  Location: [facility];  Service: Gynecology;  Laterality: N/A;   NO PAST SURGERIES     Social History   Occupational History   Not on file  Tobacco Use   Smoking status: Never   Smokeless tobacco: Never  Vaping Use   Vaping status: Never Used  Substance and Sexual Activity   Alcohol use: Yes    Comment: occasionally   Drug use: No   Sexual activity: Not Currently    Birth control/protection: None

## 2023-04-10 ENCOUNTER — Other Ambulatory Visit: Payer: Self-pay

## 2023-04-10 ENCOUNTER — Encounter: Payer: Self-pay | Admitting: Family Medicine

## 2023-04-10 ENCOUNTER — Ambulatory Visit: Payer: Medicaid Other | Attending: Family Medicine | Admitting: Family Medicine

## 2023-04-10 VITALS — BP 148/96 | HR 100 | Ht 68.0 in | Wt 290.4 lb

## 2023-04-10 DIAGNOSIS — R000 Tachycardia, unspecified: Secondary | ICD-10-CM

## 2023-04-10 DIAGNOSIS — G8929 Other chronic pain: Secondary | ICD-10-CM

## 2023-04-10 DIAGNOSIS — R7303 Prediabetes: Secondary | ICD-10-CM

## 2023-04-10 DIAGNOSIS — M5442 Lumbago with sciatica, left side: Secondary | ICD-10-CM

## 2023-04-10 DIAGNOSIS — I10 Essential (primary) hypertension: Secondary | ICD-10-CM | POA: Diagnosis not present

## 2023-04-10 MED ORDER — LABETALOL HCL 200 MG PO TABS
200.0000 mg | ORAL_TABLET | Freq: Two times a day (BID) | ORAL | 1 refills | Status: DC
  Filled 2023-04-10: qty 180, 90d supply, fill #0

## 2023-04-10 MED ORDER — AMLODIPINE BESYLATE 10 MG PO TABS
10.0000 mg | ORAL_TABLET | Freq: Every day | ORAL | 1 refills | Status: DC
  Filled 2023-04-10: qty 90, 90d supply, fill #0

## 2023-04-10 MED ORDER — WEGOVY 1.7 MG/0.75ML ~~LOC~~ SOAJ
1.7000 mg | SUBCUTANEOUS | 3 refills | Status: DC
  Filled 2023-04-10: qty 3, 28d supply, fill #0
  Filled 2023-05-25: qty 3, 28d supply, fill #1

## 2023-04-10 NOTE — Progress Notes (Signed)
Subjective:  Patient ID: Delton Prairie, female    DOB: 1986-03-13  Age: 37 y.o. MRN: [id number]  CC: Medical Management of Chronic Issues (Back pain)   HPI Jamilet Darley is a 37 y.o. year old female with a history of hypertension, prediabetes.  Interval History: Discussed the use of AI scribe software for clinical note transcription with the patient, who gave verbal consent to proceed.  She presents with back pain that has been ongoing for approximately two weeks. The pain, which radiates down the left side, led to an emergency room visit and two urgent care visits. Initial treatment with muscle relaxers did not alleviate the pain. An x-ray at the second urgent care visit did not reveal any abnormalities, but a cortisone shot was administered and prednisone was prescribed. The patient has been taking the prednisone and reports some improvement, but pain persists. An orthopedic consultation resulted in a scheduled MRI.  The patient also reports inconsistent adherence to her antihypertensive medications, amlodipine and Labetalol, which she attributes to forgetfulness rather than intentional non-compliance. Of note  her blood pressure has been consistently high over the past several months, even on the days she has taken her medications.  The patient is also on semaglutide [facility]) for prediabetes and weight management. She is currently taking 1mg  weekly and has enough supply for the next few weeks.        Past Medical History:  Diagnosis Date   Endometrial polyp 03/17/2019   Hypertension    Morbid obesity (HCC)    Pre-diabetes    per patient    Past Surgical History:  Procedure Laterality Date   HYSTEROSCOPY WITH D & C N/A 04/08/2019   Procedure: DILATATION AND CURETTAGE /HYSTEROSCOPY WITH ENDOMETRIAL POLYPECTOMY;  Surgeon: Hermina Staggers, MD;  Location: [facility];  Service: Gynecology;  Laterality: N/A;   NO PAST SURGERIES      Family History  Problem Relation Age of Onset    Hypertension Mother    Diabetes Mother    Diabetes Father    Hypertension Sister    Hypertension Sister     Social History   Socioeconomic History   Marital status: Single    Spouse name: Not on file   Number of children: Not on file   Years of education: Not on file   Highest education level: Not on file  Occupational History   Not on file  Tobacco Use   Smoking status: Never   Smokeless tobacco: Never  Vaping Use   Vaping status: Never Used  Substance and Sexual Activity   Alcohol use: Yes    Comment: occasionally   Drug use: No   Sexual activity: Not Currently    Birth control/protection: None  Other Topics Concern   Not on file  Social History Narrative   ** Merged History Encounter **       Social Determinants of Health   Financial Resource Strain: Not on file  Food Insecurity: No Food Insecurity (09/21/2021)   Hunger Vital Sign    Worried About Running Out of Food in the Last Year: Never true    Ran Out of Food in the Last Year: Never true  Transportation Needs: No Transportation Needs (09/21/2021)   PRAPARE - Administrator, Civil Service (Medical): No    Lack of Transportation (Non-Medical): No  Physical Activity: Not on file  Stress: Not on file  Social Connections: Unknown (03/05/2023)   Received [facility]   Social Network  Social Network: Not on file    No Known Allergies  Outpatient Medications Prior to Visit  Medication Sig Dispense Refill   Blood Pressure Monitoring (BLOOD PRESSURE CUFF) MISC 1 each by Does not apply route daily. 1 each 0   cyclobenzaprine (FLEXERIL) 5 MG tablet Take 1-2 tablets (5-10 mg dose) by mouth 3 (three) times a day as needed for Muscle spasms for up to 10 days. 60 tablet 0   predniSONE (DELTASONE) 20 MG tablet Take 1 tablet (20 mg total) by mouth daily with breakfast. 10 tablet 0   traMADol (ULTRAM) 50 MG tablet Take 1 tablet (50 mg total) by mouth every 6 (six) hours as needed. 30 tablet 0    amLODipine (NORVASC) 10 MG tablet Take 1 tablet (10 mg total) by mouth daily to lower blood pressure 90 tablet 1   labetalol (NORMODYNE) 100 MG tablet Take 1 tablet (100 mg total) by mouth 2 (two) times daily to lower blood pressure and heart rate 180 tablet 1   Semaglutide-Weight Management 1 MG/0.5ML SOAJ Inject 1 mg into the skin once a week. 2 mL 3   No facility-administered medications prior to visit.     ROS Review of Systems  Constitutional:  Negative for activity change and appetite change.  HENT:  Negative for sinus pressure and sore throat.   Respiratory:  Negative for chest tightness, shortness of breath and wheezing.   Cardiovascular:  Negative for chest pain and palpitations.  Gastrointestinal:  Negative for abdominal distention, abdominal pain and constipation.  Genitourinary: Negative.   Musculoskeletal:  Positive for back pain.  Psychiatric/Behavioral:  Negative for behavioral problems and dysphoric mood.     Objective:  BP (!) 148/96   Pulse 100   Ht 5\' 8"  (1.727 m)   Wt 290 lb 6.4 oz (131.7 kg)   LMP 03/24/2023 (Approximate)   SpO2 100%   BMI 44.16 kg/m      04/10/2023    2:36 PM 04/10/2023    2:00 PM 04/02/2023    3:28 PM  BP/Weight  Systolic BP 148 150 161  Diastolic BP 96 82 103  Wt. (Lbs)  290.4   BMI  44.16 kg/m2       Physical Exam Constitutional:      Appearance: She is obese.  Musculoskeletal:     Lumbar back: Tenderness (L lumbar region) present. Positive left straight leg raise test. Negative right straight leg raise test.        Latest Ref Rng & Units 11/08/2022    4:12 PM 05/11/2022   10:55 AM 04/02/2022    5:02 PM  CMP  Glucose 70 - 99 mg/dL 132  440  102   BUN 6 - 20 mg/dL 13  10  11    Creatinine 0.57 - 1.00 mg/dL 7.25  3.66  4.40   Sodium 134 - 144 mmol/L 140  139  140   Potassium 3.5 - 5.2 mmol/L 4.0  3.9  3.4   Chloride 96 - 106 mmol/L 103  101  109   CO2 20 - 29 mmol/L 23  23  23    Calcium 8.7 - 10.2 mg/dL 9.7  9.6  9.2    Total Protein 6.5 - 8.1 g/dL   8.1   Total Bilirubin 0.3 - 1.2 mg/dL   0.7   Alkaline Phos 38 - 126 U/L   69   AST 15 - 41 U/L   29   ALT 0 - 44 U/L   37  Lipid Panel     Component Value Date/Time   CHOL 173 02/17/2021 1510   TRIG 68 02/17/2021 1510   HDL 40 02/17/2021 1510   CHOLHDL 4.3 02/17/2021 1510   LDLCALC 120 (H) 02/17/2021 1510    CBC    Component Value Date/Time   WBC 12.9 (H) 04/02/2022 1702   RBC 4.29 04/02/2022 1702   HGB 13.4 04/02/2022 1702   HCT 39.4 04/02/2022 1702   PLT 301 04/02/2022 1702   MCV 91.8 04/02/2022 1702   MCV 84.9 06/05/2012 1914   MCH 31.2 04/02/2022 1702   MCHC 34.0 04/02/2022 1702   RDW 13.0 04/02/2022 1702   LYMPHSABS 2.6 04/02/2022 1702   MONOABS 0.9 04/02/2022 1702   EOSABS 0.1 04/02/2022 1702   BASOSABS 0.1 04/02/2022 1702    Lab Results  Component Value Date   HGBA1C 5.8 11/08/2022    Assessment & Plan:      Back Pain With associated sciatica Pain radiating down the left side, ongoing for approximately 2 weeks. Recent ER visit and urgent care visits. X-ray showed no abnormalities. Currently managed with prednisone and muscle relaxers. MRI scheduled by orthopedics. -Orthopedics to manage back pain and follow-up after MRI results.  Hypertension Elevated blood pressure readings over several visits, inconsistent medication adherence reported for both Amlodipine and Labetalol. -Increase Labetalol from 100mg  BID to 200mg  BID. -Continue Amlodipine 10mg  daily. -Recheck blood pressure after medication adjustment. -Counseled on blood pressure goal of less than 130/80, low-sodium, DASH diet, medication compliance, 150 minutes of moderate intensity exercise per week. Discussed medication compliance, adverse effects.   Prediabetes and Weight Management Currently on Semaglutide 1mg  weekly. -Increase Semaglutide to 1.7mg  weekly. -Continue monitoring blood glucose levels and weight.  General Health Maintenance -Order fasting  blood work including cholesterol panel (last done in 2022). -Schedule blood work for the morning of 04/11/2023.          Meds ordered this encounter  Medications   labetalol (NORMODYNE) 200 MG tablet    Sig: Take 1 tablet (200 mg total) by mouth 2 (two) times daily.    Dispense:  180 tablet    Refill:  1    Dose increase   amLODipine (NORVASC) 10 MG tablet    Sig: Take 1 tablet (10 mg total) by mouth daily to lower blood pressure    Dispense:  90 tablet    Refill:  1   Semaglutide-Weight Management (WEGOVY) 1.7 MG/0.75ML SOAJ    Sig: Inject 1.7 mg into the skin once a week.    Dispense:  3 mL    Refill:  3    Discontinue previous dose    Follow-up: Return in about 3 months (around 07/11/2023) for Chronic medical conditions.       Hoy Register, MD, FAAFP. [facility] [place], [place] [phone]   04/10/2023, 3:18 PM

## 2023-04-10 NOTE — Patient Instructions (Signed)
VISIT SUMMARY:  During today's visit, we discussed your ongoing back pain, hypertension, and prediabetes management. We reviewed your current medications and made some adjustments to better manage your conditions. We also planned for some routine blood work to monitor your overall health.  YOUR PLAN:  -BACK PAIN: Back pain that radiates down the left side, ongoing for about two weeks. You have already visited the ER and urgent care, and an x-ray showed no abnormalities. You received a cortisone shot and are currently taking prednisone, which has provided some relief. An MRI has been scheduled to further investigate the cause of your pain. Orthopedics will manage your back pain and follow up after the MRI results.  -HYPERTENSION: Hypertension means high blood pressure. Your blood pressure has been consistently high, and you reported inconsistent adherence to your medications. We have increased your Labetalol dose from 100mg  twice daily to 200mg  twice daily and will continue your Amlodipine at 10mg  daily. Please recheck your blood pressure after this medication adjustment.  -PREDIABETES AND WEIGHT MANAGEMENT: Prediabetes means your blood sugar levels are higher than normal but not high enough to be classified as diabetes. You are currently on Semaglutide 1mg  weekly for prediabetes and weight management. We have decided to increase your Semaglutide dose to 1.7mg  weekly. Continue to monitor your blood glucose levels and weight.  -GENERAL HEALTH MAINTENANCE: We have ordered fasting blood work, including a cholesterol panel, which was last done in 2022. Please schedule this blood work for the morning of 04/11/2023.  INSTRUCTIONS:  Please follow up with orthopedics after your MRI for your back pain. Recheck your blood pressure after adjusting your Labetalol dose. Schedule your fasting blood work for the morning of 04/11/2023.

## 2023-04-11 ENCOUNTER — Other Ambulatory Visit: Payer: Self-pay

## 2023-04-11 ENCOUNTER — Encounter: Payer: Self-pay | Admitting: Physician Assistant

## 2023-04-11 ENCOUNTER — Ambulatory Visit: Payer: Medicaid Other | Attending: Family Medicine

## 2023-04-11 DIAGNOSIS — I10 Essential (primary) hypertension: Secondary | ICD-10-CM | POA: Diagnosis not present

## 2023-04-12 LAB — LP+NON-HDL CHOLESTEROL
Cholesterol, Total: 145 mg/dL (ref 100–199)
HDL: 39 mg/dL — ABNORMAL LOW (ref >39)
LDL Chol Calc (NIH): 94 mg/dL (ref 0–99)
Total Non-HDL-Chol (LDL+VLDL): 106 mg/dL (ref 0–129)
Triglycerides: 58 mg/dL (ref 0–149)
VLDL Cholesterol Cal: 12 mg/dL (ref 5–40)

## 2023-04-12 LAB — CMP14+EGFR
ALT: 26 [IU]/L (ref 0–32)
AST: 18 [IU]/L (ref 0–40)
Albumin: 4 g/dL (ref 3.9–4.9)
Alkaline Phosphatase: 74 [IU]/L (ref 44–121)
BUN/Creatinine Ratio: 10 (ref 9–23)
BUN: 8 mg/dL (ref 6–20)
Bilirubin Total: 0.3 mg/dL (ref 0.0–1.2)
CO2: 19 mmol/L — ABNORMAL LOW (ref 20–29)
Calcium: 9.2 mg/dL (ref 8.7–10.2)
Chloride: 105 mmol/L (ref 96–106)
Creatinine, Ser: 0.78 mg/dL (ref 0.57–1.00)
Globulin, Total: 3.4 g/dL (ref 1.5–4.5)
Glucose: 81 mg/dL (ref 70–99)
Potassium: 3.9 mmol/L (ref 3.5–5.2)
Sodium: 140 mmol/L (ref 134–144)
Total Protein: 7.4 g/dL (ref 6.0–8.5)
eGFR: 102 mL/min/{1.73_m2} (ref >59)

## 2023-04-12 LAB — CBC WITH DIFFERENTIAL/PLATELET
Basophils Absolute: 0 10*3/uL (ref 0.0–0.2)
Basos: 0 %
EOS (ABSOLUTE): 0.1 10*3/uL (ref 0.0–0.4)
Eos: 1 %
Hematocrit: 40.7 % (ref 34.0–46.6)
Hemoglobin: 13.2 g/dL (ref 11.1–15.9)
Immature Grans (Abs): 0 10*3/uL (ref 0.0–0.1)
Immature Granulocytes: 0 %
Lymphocytes Absolute: 2.2 10*3/uL (ref 0.7–3.1)
Lymphs: 19 %
MCH: 30.2 pg (ref 26.6–33.0)
MCHC: 32.4 g/dL (ref 31.5–35.7)
MCV: 93 fL (ref 79–97)
Monocytes Absolute: 0.7 10*3/uL (ref 0.1–0.9)
Monocytes: 6 %
Neutrophils Absolute: 8.2 10*3/uL — ABNORMAL HIGH (ref 1.4–7.0)
Neutrophils: 74 %
Platelets: 279 10*3/uL (ref 150–450)
RBC: 4.37 x10E6/uL (ref 3.77–5.28)
RDW: 13.1 % (ref 11.7–15.4)
WBC: 11.2 10*3/uL — ABNORMAL HIGH (ref 3.4–10.8)

## 2023-04-23 ENCOUNTER — Ambulatory Visit
Admission: RE | Admit: 2023-04-23 | Discharge: 2023-04-23 | Disposition: A | Discharge status: Home / Self Care | Payer: Medicaid Other | Source: Ambulatory Visit | Attending: Physician Assistant | Admitting: Physician Assistant

## 2023-04-23 ENCOUNTER — Other Ambulatory Visit: Payer: Self-pay

## 2023-04-23 DIAGNOSIS — M48061 Spinal stenosis, lumbar region without neurogenic claudication: Secondary | ICD-10-CM | POA: Diagnosis not present

## 2023-04-23 DIAGNOSIS — M5440 Lumbago with sciatica, unspecified side: Secondary | ICD-10-CM

## 2023-04-23 DIAGNOSIS — M5126 Other intervertebral disc displacement, lumbar region: Secondary | ICD-10-CM | POA: Diagnosis not present

## 2023-05-03 ENCOUNTER — Other Ambulatory Visit: Payer: Self-pay | Admitting: Physician Assistant

## 2023-05-03 DIAGNOSIS — M5440 Lumbago with sciatica, unspecified side: Secondary | ICD-10-CM

## 2023-05-04 ENCOUNTER — Ambulatory Visit: Payer: Medicaid Other | Admitting: Physician Assistant

## 2023-05-07 ENCOUNTER — Telehealth: Payer: Self-pay | Admitting: Physical Medicine and Rehabilitation

## 2023-05-07 NOTE — Telephone Encounter (Signed)
Patient returned call asked for a call back to schedule an appointment. The number to contact patient is [phone]

## 2023-05-15 ENCOUNTER — Encounter: Payer: Self-pay | Admitting: Physical Medicine and Rehabilitation

## 2023-05-15 ENCOUNTER — Other Ambulatory Visit: Payer: Self-pay

## 2023-05-15 ENCOUNTER — Ambulatory Visit (INDEPENDENT_AMBULATORY_CARE_PROVIDER_SITE_OTHER): Payer: Medicaid Other | Admitting: Physical Medicine and Rehabilitation

## 2023-05-15 VITALS — BP 138/95 | HR 98

## 2023-05-15 DIAGNOSIS — G8929 Other chronic pain: Secondary | ICD-10-CM | POA: Diagnosis not present

## 2023-05-15 DIAGNOSIS — M5116 Intervertebral disc disorders with radiculopathy, lumbar region: Secondary | ICD-10-CM | POA: Diagnosis not present

## 2023-05-15 DIAGNOSIS — M5416 Radiculopathy, lumbar region: Secondary | ICD-10-CM

## 2023-05-15 MED ORDER — METHOCARBAMOL 500 MG PO TABS
500.0000 mg | ORAL_TABLET | Freq: Three times a day (TID) | ORAL | 0 refills | Status: AC
  Filled 2023-05-15: qty 90, 30d supply, fill #0

## 2023-05-15 MED ORDER — MELOXICAM 15 MG PO TABS
15.0000 mg | ORAL_TABLET | Freq: Every day | ORAL | 0 refills | Status: AC
  Filled 2023-05-15: qty 30, 30d supply, fill #0

## 2023-05-15 NOTE — Progress Notes (Signed)
Dana Allison - 37 y.o. female MRN [id number]  Date of birth: 1985/06/07  Office Visit Note: Visit Date: 05/15/2023 PCP: Hoy Register, MD Referred by: Hoy Register, MD  Subjective: Chief Complaint  Patient presents with   Lower Back - Pain   Left Leg - Pain   HPI: Dana Allison is a 37 y.o. female who comes in today per the request of [place] Persons, PA for evaluation of chronic left sided lower back pain radiating to left thigh down to knee. Pain ongoing for over a month, worsens with bending and activity. She describes pain as sore and aching sensation, currently rates as 6 out of 10. Some relief of pain with home exercise regimen, rest and use of medications. Good relief of pain with oral Prednisone. No history of formal physical therapy. Recent lumbar MRI imaging shows left paracentral disc protrusion at L4-L5, moderate left foraminal and lateral recess stenosis at this level. No high grade spinal canal stenosis noted. Patient states she has noticed significant improvement in pain over time, however her discomfort continues to cause functional limitations. Patient is currently working full time at [employer]. Patient denies focal weakness, numbness and tingling. No recent trauma or falls.      Review of Systems  Musculoskeletal:  Positive for back pain.  Neurological:  Negative for tingling, sensory change, focal weakness and weakness.  All other systems reviewed and are negative.  Otherwise per HPI.  Assessment & Plan: Visit Diagnoses:    ICD-10-CM   1. Lumbar radiculopathy  M54.16 Ambulatory referral to Physical Therapy    2. Intervertebral disc disorders with radiculopathy, lumbar region  M51.16 Ambulatory referral to Physical Therapy    3. Chronic left-sided low back pain with left-sided sciatica  M54.42 Ambulatory referral to Physical Therapy   G89.29        Plan: Findings:  Chronic left sided lower back pain radiating to left thigh down to knee. Her  pain has improved significantly over the last several weeks with continued conservative therapies such as home exercise regimen and medication, however her discomfort continue to cause functional limitations. I discussed recent lumbar MRI with her today using imaging and spine model. Patients clinical presentation and exam are consistent with both L4 and L5 nerve patterns, there is left paracentral at the level of L4-L5 on recent lumbar MRI imaging. We discussed treatment plan in detail today including possibility of performing lumbar epidural steroid injection. Would also recommend short course of formal physical therapy and continued medication therapy. Patient would like to hold on injection at this time as her pain does seem to be improving over time. I placed order for short course of PT today, also prescribed both Meloxicam and Robaxin. I would like to see her back in 8 weeks for re-evaluation. Should her pain increase or persist would recommend moving forward with epidural steroid injection. She has no questions at this time. No red flag symptoms noted upon exam today.     Meds & Orders:  Meds ordered this encounter  Medications   meloxicam (MOBIC) 15 MG tablet    Sig: Take 1 tablet (15 mg total) by mouth daily.    Dispense:  30 tablet    Refill:  0   methocarbamol (ROBAXIN) 500 MG tablet    Sig: Take 1 tablet (500 mg total) by mouth 3 (three) times daily.    Dispense:  90 tablet    Refill:  0    Orders Placed This Encounter  Procedures  Ambulatory referral to Physical Therapy    Follow-up: Return for 8 week follow up.   Procedures: No procedures performed      Clinical History: CLINICAL DATA:  37 year old female with low back pain, bilateral sciatica.   EXAM: MRI LUMBAR SPINE WITHOUT CONTRAST   TECHNIQUE: Multiplanar, multisequence MR imaging of the lumbar spine was performed. No intravenous contrast was administered.   COMPARISON:  Lumbar radiographs 04/02/2023.    FINDINGS: Segmentation:  Normal on the comparison.   Alignment: Stable mild straightening of lumbar lordosis. Subtle dextroconvex lumbar scoliosis on the comparison. No spondylolisthesis.   Vertebrae: Visualized bone marrow signal is within normal limits. No marrow edema or evidence of acute osseous abnormality. Intact visible sacrum. Maintained vertebral body height.   Conus medullaris and cauda equina: Conus extends to the L1 level. No lower spinal cord or conus signal abnormality. Normal cauda equina nerve roots.   Paraspinal and other soft tissues: Negative.   Disc levels:   Visible lower thoracic levels through   T12-L1:  Negative.   L1-L2:  Negative disc.  Mild facet hypertrophy.  No stenosis.   L2-L3: Negative disc. Mild facet hypertrophy. Mild endplate spurring. No stenosis.   L3-L4: Negative disc. Mild facet and ligament flavum hypertrophy. Mild endplate spurring. No convincing stenosis.   L4-L5: Broad-based left paracentral disc protrusion with annular fissure (series 3, image 9 and series 6, image 28). Less pronounced superimposed facet hypertrophy at this level. Mild to moderate left lateral recess stenosis without spinal stenosis (left L5 nerve level). Mild to moderate left L4 neural foraminal stenosis. No convincing right foraminal stenosis.   L5-S1: Negative disc. Mild facet hypertrophy. No convincing stenosis.   IMPRESSION: 1. Broad-based Left paracentral disc protrusion at L4-L5 with annular fissure. Up to moderate associated left foraminal and lateral lateral recess stenosis without spinal stenosis. Query Left L4 and/or L5 radiculitis (respectively). 2. No other significant lumbar disc degeneration. Multilevel lumbar mild facet, posterior element degeneration. No other onvincing neural impingement.     Electronically Signed   By: Odessa Fleming M.D.   On: 05/03/2023 09:42   She reports that she has never smoked. She has never used smokeless tobacco.   Recent Labs    11/08/22 1547  HGBA1C 5.8    Objective:  VS:  HT:    WT:   BMI:     BP:(!) 138/95  HR:98bpm  TEMP: ( )  RESP:  Physical Exam Vitals and nursing note reviewed.  HENT:     Head: Normocephalic and atraumatic.     Right Ear: External ear normal.     Left Ear: External ear normal.     Nose: Nose normal.     Mouth/Throat:     Mouth: Mucous membranes are moist.  Eyes:     Extraocular Movements: Extraocular movements intact.  Cardiovascular:     Rate and Rhythm: Normal rate.     Pulses: Normal pulses.  Pulmonary:     Effort: Pulmonary effort is normal.  Abdominal:     General: Abdomen is flat. There is no distension.  Musculoskeletal:        General: Tenderness present.     Cervical back: Normal range of motion.     Comments: Patient rises from seated position to standing without difficulty. Good lumbar range of motion. No pain noted with facet loading. 5/5 strength noted with bilateral hip flexion, knee flexion/extension, ankle dorsiflexion/plantarflexion and EHL. No clonus noted bilaterally. No pain upon palpation of greater trochanters. No pain with internal/external  rotation of bilateral hips. Sensation intact bilaterally. Dysesthesias noted to left L4 and L5 dermatomes. Negative slump test bilaterally. Ambulates without aid, gait steady.     Skin:    General: Skin is warm and dry.     Capillary Refill: Capillary refill takes less than 2 seconds.  Neurological:     General: No focal deficit present.     Mental Status: She is alert and oriented to person, place, and time.  Psychiatric:        Mood and Affect: Mood normal.        Behavior: Behavior normal.     Ortho Exam  Imaging: No results found.  Past Medical/Family/Surgical/Social History: Medications & Allergies reviewed per EMR, new medications updated. Patient Active Problem List   Diagnosis Date Noted   Low back pain 04/05/2023   BMI 40.0-44.9, adult (HCC) 09/21/2021   Amenorrhea, secondary  09/21/2021   Malpositioned intrauterine device (IUD) 03/28/2021   Severe dysplasia of cervix (CIN III) 12/20/2020   LGSIL on Pap smear of cervix 11/01/2020   Morbid obesity (HCC)    Hypertension    Pre-diabetes    DUB (dysfunctional uterine bleeding) 09/19/2018   OBESITY, NOS 07/26/2006   Past Medical History:  Diagnosis Date   Endometrial polyp 03/17/2019   Hypertension    Morbid obesity (HCC)    Pre-diabetes    per patient   Family History  Problem Relation Age of Onset   Hypertension Mother    Diabetes Mother    Diabetes Father    Hypertension Sister    Hypertension Sister    Past Surgical History:  Procedure Laterality Date   HYSTEROSCOPY WITH D & C N/A 04/08/2019   Procedure: DILATATION AND CURETTAGE /HYSTEROSCOPY WITH ENDOMETRIAL POLYPECTOMY;  Surgeon: Hermina Staggers, MD;  Location: [facility] OR;  Service: Gynecology;  Laterality: N/A;   NO PAST SURGERIES     Social History   Occupational History   Not on file  Tobacco Use   Smoking status: Never   Smokeless tobacco: Never  Vaping Use   Vaping status: Never Used  Substance and Sexual Activity   Alcohol use: Yes    Comment: occasionally   Drug use: No   Sexual activity: Not Currently    Birth control/protection: None

## 2023-05-15 NOTE — Progress Notes (Signed)
Functional Pain Scale - descriptive words and definitions  Distressing (6)    Pain is present/unable to complete most ADLs limited by pain/sleep is difficult and active distraction is only marginal. Moderate range order  Average Pain 6  LBP with left sided leg pain. Prednisone has made the pain a little better. Pain is worse when sitting/relaxing

## 2023-05-24 ENCOUNTER — Other Ambulatory Visit: Payer: Self-pay

## 2023-06-05 NOTE — Therapy (Signed)
 OUTPATIENT PHYSICAL THERAPY THORACOLUMBAR EVALUATION   Patient Name: Dana Allison MRN: [id number] DOB:[birth year], 38 y.o., female Today's Date: 06/07/2023  END OF SESSION:  PT End of Session - 06/07/23 1038     Visit Number 1    Number of Visits 9    Date for PT Re-Evaluation 08/10/23    Authorization Type [place] MEDICAID HEALTHY BLUE    PT Start Time 0930    PT Stop Time 1015    PT Time Calculation (min) 45 min    Activity Tolerance Patient tolerated treatment well    Behavior During Therapy [facility] for tasks assessed/performed             Past Medical History:  Diagnosis Date   Endometrial polyp 03/17/2019   Hypertension    Morbid obesity (HCC)    Pre-diabetes    per patient   Past Surgical History:  Procedure Laterality Date   HYSTEROSCOPY WITH D & C N/A 04/08/2019   Procedure: DILATATION AND CURETTAGE /HYSTEROSCOPY WITH ENDOMETRIAL POLYPECTOMY;  Surgeon: Lorence Ozell CROME, MD;  Location: [facility] OR;  Service: Gynecology;  Laterality: N/A;   NO PAST SURGERIES     Patient Active Problem List   Diagnosis Date Noted   Low back pain 04/05/2023   BMI 40.0-44.9, adult (HCC) 09/21/2021   Amenorrhea, secondary 09/21/2021   Malpositioned intrauterine device (IUD) 03/28/2021   Severe dysplasia of cervix (CIN III) 12/20/2020   LGSIL on Pap smear of cervix 11/01/2020   Morbid obesity (HCC)    Hypertension    Pre-diabetes    DUB (dysfunctional uterine bleeding) 09/19/2018   OBESITY, NOS 07/26/2006    PCP: Delbert Clam, MD   REFERRING PROVIDER: Trudy Duwaine BRAVO, NP  REFERRING DIAG:  M54.16 (ICD-10-CM) - Lumbar radiculopathy  M51.16 (ICD-10-CM) - Intervertebral disc disorders with radiculopathy, lumbar region  M54.42,G89.29 (ICD-10-CM) - Chronic left-sided low back pain with left-sided sciatica    Rationale for Evaluation and Treatment: Rehabilitation  THERAPY DIAG:  Left-sided low back pain with left-sided sciatica, unspecified chronicity  Muscle weakness  (generalized)  ONSET DATE: Approx 2 months ago  SUBJECTIVE:                                                                                                                                                                                           SUBJECTIVE STATEMENT: Pt reports she was experiencing significant low back pain and L LE pain that started insidiously about 2 months ago. Since then, the pain has improve a great deal with pt est. 95% improvement. A muscle relaxer and prednisone , and rest have been helpful with relieving the pain.   PERTINENT HISTORY:  High BMI  PAIN:  Are you having pain? Yes: NPRS scale: 0/10 Pain location: L low back and L LE Pain description: constant ache Aggravating factors: Many different movements Relieving factors: Medications, rest  PRECAUTIONS: None  RED FLAGS: None   WEIGHT BEARING RESTRICTIONS: No  FALLS:  Has patient fallen in last 6 months? No  LIVING ENVIRONMENT: Lives with: lives alone Lives in: House/apartment No issue with accessing home  OCCUPATION: [employer], sales  PLOF: Independent  PATIENT GOALS: To get better  NEXT MD VISIT: February  OBJECTIVE:  Note: Objective measures were completed at Evaluation unless otherwise noted.  DIAGNOSTIC FINDINGS:  Lumbar spine MRI 04/23/23 IMPRESSION: 1. Broad-based Left paracentral disc protrusion at L4-L5 with annular fissure. Up to moderate associated left foraminal and lateral lateral recess stenosis without spinal stenosis. Query Left L4 and/or L5 radiculitis (respectively). 2. No other significant lumbar disc degeneration. Multilevel lumbar mild facet, posterior element degeneration. No other onvincing neural impingement.   PATIENT SURVEYS:  FOTO: Perceived function   63%, predicted   72%   COGNITION: Overall cognitive status: Within functional limits for tasks assessed     SENSATION: WFL  MUSCLE LENGTH: Hamstrings: Right 50 deg; Left 40  deg Thomas test: Right WNLs deg; Left WNLs deg  POSTURE: forward head and out toeing  PALPATION: Currently not TTP  LUMBAR ROM:   AROM eval  Flexion Min limited, tightness low back  Extension Min limited, no p  Right lateral flexion Full, L LBP  Left lateral flexion Full, no p  Right rotation Full, no p  Left rotation Full, no p   (Blank rows = not tested)  LOWER EXTREMITY MMT:     Grossly WNLs  Right eval Left eval  Hip flexion 4 4  Hip extension 4 4  Hip abduction 4 4  Hip adduction    Hip internal rotation    Hip external rotation 4 4  Knee flexion 5 5  Knee extension 5 4+  Ankle dorsiflexion 5 4+  Ankle plantarflexion    Ankle inversion    Ankle eversion     (Blank rows = not tested)  LOWER EXTREMITY ROM:     Grossly WNLs MMT Right eval Left eval  Hip flexion    Hip extension    Hip abduction    Hip adduction    Hip internal rotation    Hip external rotation    Knee flexion    Knee extension    Ankle dorsiflexion    Ankle plantarflexion    Ankle inversion    Ankle eversion     (Blank rows = not tested)  LUMBAR SPECIAL TESTS:  Straight leg raise test: L tighter and mildly more symptomatic than R and Slump test: L tighter and mildly more symptomatic than R   FUNCTIONAL TESTS:  Static bridging 30  GAIT: Distance walked: 200' Assistive device utilized: None Level of assistance: Complete Independence Comments: Out toeing bilat  TREATMENT DATE:  [facility] Adult PT Treatment:                                                DATE: 06/07/23 Therapeutic Exercise: Developed, instructed in, and pt completed therex as noted in HEP   PATIENT EDUCATION:  Education details: Eval findings, POC, HEP Person educated: Patient Education method: Explanation, Demonstration, Tactile cues, Verbal cues, and Handouts Education comprehension:  verbalized understanding, returned demonstration, verbal cues required, and tactile cues required  HOME EXERCISE PROGRAM: Access Code: [email or web address] URL: [email or web address] Date: 06/07/2023 Prepared by: Dasie Daft  Exercises - Hooklying Single Knee to Chest Stretch  - 2 x daily - 7 x weekly - 1 sets - 3 reps - 20 hold - Supine Sciatic Nerve Glide  - 2 x daily - 7 x weekly - 3 sets - 10 reps - Supine Bridge  - 2 x daily - 7 x weekly - 1 sets - 10 reps - 5 hold - Hooklying Clamshell with Resistance  - 2 x daily - 7 x weekly - 1 sets - 10 reps - 5 hold  ASSESSMENT:  CLINICAL IMPRESSION: Patient is a 38 y.o. female who was seen today for physical therapy evaluation and treatment for  M54.16 (ICD-10-CM) - Lumbar radiculopathy  M51.16 (ICD-10-CM) - Intervertebral disc disorders with radiculopathy, lumbar region  M54.42,G89.29 (ICD-10-CM) - Chronic left-sided low back pain with left-sided sciatica  Pt presents with L low back and L LE pain which has been gradually improving with the use of a muscle relaxer and steroid. Pt demonstrates decreased L quad and ankle DF strength indicative L4, L5 nerve root involvement. Additionally, pt has decreased hip and core strength. Pt was started on an initial HEP. Pt will benefit from skilled PT 1w8 to address impairments to optimize back function.   OBJECTIVE IMPAIRMENTS: decreased activity tolerance, decreased ROM, decreased strength, obesity, and pain.   ACTIVITY LIMITATIONS: carrying, lifting, bending, sitting, standing, and locomotion level  PARTICIPATION LIMITATIONS: meal prep, cleaning, laundry, and occupation  PERSONAL FACTORS: Fitness, Past/current experiences, Time since onset of injury/illness/exacerbation, and 1 comorbidity: high BMI  are also affecting patient's functional outcome.   REHAB POTENTIAL: Good  CLINICAL DECISION MAKING: Evolving/moderate complexity  EVALUATION COMPLEXITY: Moderate   GOALS:  SHORT TERM GOALS:  Target date: 06/29/23  Pt will be Ind in an initial HEP  Baseline: started Goal status: INITIAL   LONG TERM GOALS: Target date: 08/10/23  Pt will be Ind in a final HEP to maintain achieved LOF  Baseline: started Goal status: INITIAL  2.  Increased bilat hip strength to 4+ and L ankle DF and knee ext to 5/5 for improved functional mobility Baseline: see flow sheets Goal status: INITIAL  3.  Pt will be able to complete a static bridge for 90 sec and a static plank from knees for 30 to demonstrate improved core strength Baseline: bridge 30 Goal status: INITIAL  4.  Increase L SLR ROM to equal the R to improve both the muscular and neural flexibility Baseline:  Goal status: INITIAL  5.  Pt's FOTO score will improved to the predicted value of 63% as indication of improved function  Baseline: 74% Goal status: INITIAL  PLAN:  PT FREQUENCY: 1x/week  PT DURATION: 8 weeks  PLANNED INTERVENTIONS: [id number]- PT Re-evaluation, 97110-Therapeutic exercises, 97530- Therapeutic activity, [id number]- Neuromuscular re-education, 97535- Self Care, [id number]- Manual therapy, [id number]-  Aquatic Therapy, [id number]- Electrical stimulation (manual), Patient/Family education, Taping, Dry Needling, Spinal mobilization, Cryotherapy, and Moist heat.  PLAN FOR NEXT SESSION: Review FOTO; assess response to HEP; body mechanics; progress therex as indicated; use of modalities, manual therapy, and TPDN as indicated.  Annaleigha Woo MS, PT 06/07/23 1:54 PM  For all possible CPT codes, reference the Planned Interventions line above.     Check all conditions that are expected to impact treatment: {Conditions expected to impact treatment:Morbid obesity and Musculoskeletal disorders   If treatment provided at initial evaluation, no treatment charged due to lack of authorization.

## 2023-06-07 ENCOUNTER — Ambulatory Visit: Payer: Medicaid Other | Attending: Physical Medicine and Rehabilitation

## 2023-06-07 ENCOUNTER — Other Ambulatory Visit: Payer: Self-pay

## 2023-06-07 DIAGNOSIS — M5116 Intervertebral disc disorders with radiculopathy, lumbar region: Secondary | ICD-10-CM | POA: Insufficient documentation

## 2023-06-07 DIAGNOSIS — M5416 Radiculopathy, lumbar region: Secondary | ICD-10-CM | POA: Diagnosis not present

## 2023-06-07 DIAGNOSIS — M6281 Muscle weakness (generalized): Secondary | ICD-10-CM | POA: Diagnosis not present

## 2023-06-07 DIAGNOSIS — G8929 Other chronic pain: Secondary | ICD-10-CM | POA: Insufficient documentation

## 2023-06-07 DIAGNOSIS — M5442 Lumbago with sciatica, left side: Secondary | ICD-10-CM | POA: Diagnosis not present

## 2023-06-13 NOTE — Therapy (Signed)
OUTPATIENT PHYSICAL THERAPY THORACOLUMBAR TREATMENT   Patient Name: Dana Allison MRN: [id number] DOB:29-Oct-1985, 38 y.o., female Today's Date: 06/15/2023  END OF SESSION:  PT End of Session - 06/15/23 0918     Visit Number 2    Number of Visits 9    Date for PT Re-Evaluation 08/10/23    Authorization Type [place] MEDICAID HEALTHY BLUE    PT Start Time 0850    PT Stop Time 0933    PT Time Calculation (min) 43 min    Activity Tolerance Patient tolerated treatment well    Behavior During Therapy [facility] for tasks assessed/performed              Past Medical History:  Diagnosis Date   Endometrial polyp 03/17/2019   Hypertension    Morbid obesity (HCC)    Pre-diabetes    per patient   Past Surgical History:  Procedure Laterality Date   HYSTEROSCOPY WITH D & C N/A 04/08/2019   Procedure: DILATATION AND CURETTAGE /HYSTEROSCOPY WITH ENDOMETRIAL POLYPECTOMY;  Surgeon: Hermina Staggers, MD;  Location: [facility] OR;  Service: Gynecology;  Laterality: N/A;   NO PAST SURGERIES     Patient Active Problem List   Diagnosis Date Noted   Low back pain 04/05/2023   BMI 40.0-44.9, adult (HCC) 09/21/2021   Amenorrhea, secondary 09/21/2021   Malpositioned intrauterine device (IUD) 03/28/2021   Severe dysplasia of cervix (CIN III) 12/20/2020   LGSIL on Pap smear of cervix 11/01/2020   Morbid obesity (HCC)    Hypertension    Pre-diabetes    DUB (dysfunctional uterine bleeding) 09/19/2018   OBESITY, NOS 07/26/2006    PCP: Hoy Register, MD   REFERRING PROVIDER: Juanda Chance, NP  REFERRING DIAG:  M54.16 (ICD-10-CM) - Lumbar radiculopathy  M51.16 (ICD-10-CM) - Intervertebral disc disorders with radiculopathy, lumbar region  M54.42,G89.29 (ICD-10-CM) - Chronic left-sided low back pain with left-sided sciatica    Rationale for Evaluation and Treatment: Rehabilitation  THERAPY DIAG:  Left-sided low back pain with left-sided sciatica, unspecified chronicity  ONSET DATE: Approx 2  months ago  SUBJECTIVE:                                                                                                                                                                                           SUBJECTIVE STATEMENT: I've been completing the HEP 2x per week without issue.  Eval: Pt reports she was experiencing significant low back pain and L LE pain that started insidiously about 2 months ago. Since then, the pain has improve a great deal with pt est. 95% improvement. A muscle relaxer and prednisone, and rest have been  helpful with relieving the pain.   PERTINENT HISTORY:  High BMI  PAIN:  Are you having pain? Yes: NPRS scale: 0/10 Pain location: L low back and L LE Pain description: constant ache Aggravating factors: Many different movements Relieving factors: Medications, rest  PRECAUTIONS: None  RED FLAGS: None   WEIGHT BEARING RESTRICTIONS: No  FALLS:  Has patient fallen in last 6 months? 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  DIAGNOSTIC FINDINGS:  Lumbar spine MRI 04/23/23 IMPRESSION: 1. Broad-based Left paracentral disc protrusion at L4-L5 with annular fissure. Up to moderate associated left foraminal and lateral lateral recess stenosis without spinal stenosis. Query Left L4 and/or L5 radiculitis (respectively). 2. No other significant lumbar disc degeneration. Multilevel lumbar mild facet, posterior element degeneration. No other onvincing neural impingement.   PATIENT SURVEYS:  FOTO: Perceived function   63%, predicted   72%   COGNITION: Overall cognitive status: Within functional limits for tasks assessed     SENSATION: WFL  MUSCLE LENGTH: Hamstrings:  Right 50 deg; Left 40 deg Thomas test: Right WNLs deg; Left WNLs deg  POSTURE: forward head and out toeing  PALPATION: Currently not TTP  LUMBAR ROM:   AROM eval  Flexion Min limited, tightness low back  Extension Min limited, no p  Right lateral flexion Full, L LBP  Left lateral flexion Full, no p  Right rotation Full, no p  Left rotation Full, no p   (Blank rows = not tested)  LOWER EXTREMITY MMT:     Grossly WNLs  Right eval Left eval  Hip flexion 4 4  Hip extension 4 4  Hip abduction 4 4  Hip adduction    Hip internal rotation    Hip external rotation 4 4  Knee flexion 5 5  Knee extension 5 4+  Ankle dorsiflexion 5 4+  Ankle plantarflexion    Ankle inversion    Ankle eversion     (Blank rows = not tested)  LOWER EXTREMITY ROM:     Grossly WNLs MMT Right eval Left eval  Hip flexion    Hip extension    Hip abduction    Hip adduction    Hip internal rotation    Hip external rotation    Knee flexion    Knee extension    Ankle dorsiflexion    Ankle plantarflexion    Ankle inversion    Ankle eversion     (Blank rows = not tested)  LUMBAR SPECIAL TESTS:  Straight leg raise test: L tighter and mildly more symptomatic than R and Slump test: L tighter and mildly more symptomatic than R   FUNCTIONAL TESTS:  Static bridging 30"  GAIT: Distance walked: 200' Assistive device utilized: None Level of assistance: Complete Independence Comments: Out toeing bilat  TREATMENT DATE:    [facility] Adult PT Treatment:                                                DATE: 06/15/23 Therapeutic Exercise: Nustep 5 mins L5 UE/LE while taking subjective Hooklying Single Knee to Chest Stretch 2 reps - 20 hold Supine Sciatic Nerve Glide  1 sets - 10 reps TA set x10 TA set c marching 2x10 BluTB Supine Bridge 15 reps - 5 hold Hooklying Clamshell 15 reps - 5 hold BluTB Prone press ups x10 Prone alt leg lifts x15 each Updated HEP                                                                                                                             [facility] Adult PT Treatment:                                                DATE: 06/07/23 Therapeutic Exercise: Developed, instructed in, and pt completed therex as noted in HEP   PATIENT EDUCATION:  Education details: Eval findings, POC, HEP Person educated: Patient Education method: Explanation, Demonstration, Tactile cues, Verbal cues, and Handouts Education comprehension: verbalized understanding, returned demonstration, verbal cues required, and tactile cues required  HOME EXERCISE PROGRAM: Access Code: [email or web address] URL: [email or web address] Date: 06/15/2023 Prepared by: Joellyn Rued  Exercises - Hooklying Single Knee to Chest Stretch  - 2 x daily - 7 x weekly - 1 sets - 3 reps - 20 hold - Supine Sciatic Nerve Glide  - 2 x daily - 7 x weekly - 3 sets - 10 reps - Supine Bridge  - 2 x daily - 7 x weekly - 1 sets - 15 reps - 5 hold - Hooklying Clamshell with Resistance  - 2 x daily - 7 x weekly - 1 sets - 15 reps - 5 hold - Supine Transversus Abdominis Bracing - Hands on Ground  - 1 x daily - 7 x weekly - 1 sets - 15 reps - Supine March with Resistance Band  - 1 x daily - 7 x weekly - 2 sets - 15 reps - Beginner Prone Single Leg Raise  - 1 x daily - 7 x weekly - 1 sets - 15 reps - 2 hold - Prone Press Up  - 1 x daily - 7 x weekly - 1 sets - 10 reps  ASSESSMENT:  CLINICAL IMPRESSION: PT was completed for lumbopelvic/neural mobility and core strengthening with additional therex added today and the HEP updated. Pt demonstrated good understanding of her therex/HEP. Pt tolerated the prescribed therex today without adverse effects. Pt will continue to benefit from skilled PT to address impairments for improved back function.    Eval: Patient is a 38 y.o. female who was seen today for physical therapy evaluation and treatment for  M54.16 (ICD-10-CM) - Lumbar radiculopathy  M51.16 (ICD-10-CM) - Intervertebral  disc disorders with radiculopathy, lumbar region  M54.42,G89.29 (ICD-10-CM) - Chronic left-sided low back pain with left-sided sciatica  Pt presents with L low back and L LE pain which has been gradually improving with the use of a muscle relaxer and steroid. Pt demonstrates decreased L quad and ankle DF  strength indicative L4, L5 nerve root involvement. Additionally, pt has decreased hip and core strength. Pt was started on an initial HEP. Pt will benefit from skilled PT 1w8 to address impairments to optimize back function.   OBJECTIVE IMPAIRMENTS: decreased activity tolerance, decreased ROM, decreased strength, obesity, and pain.   ACTIVITY LIMITATIONS: carrying, lifting, bending, sitting, standing, and locomotion level  PARTICIPATION LIMITATIONS: meal prep, cleaning, laundry, and occupation  PERSONAL FACTORS: Fitness, Past/current experiences, Time since onset of injury/illness/exacerbation, and 1 comorbidity: high BMI  are also affecting patient's functional outcome.   REHAB POTENTIAL: Good  CLINICAL DECISION MAKING: Evolving/moderate complexity  EVALUATION COMPLEXITY: Moderate   GOALS:  SHORT TERM GOALS: Target date: 06/29/23  Pt will be Ind in an initial HEP  Baseline: started Goal status: ONGOING   LONG TERM GOALS: Target date: 08/10/23  Pt will be Ind in a final HEP to maintain achieved LOF  Baseline: started Goal status: INITIAL  2.  Increased bilat hip strength to 4+ and L ankle DF and knee ext to 5/5 for improved functional mobility Baseline: see flow sheets Goal status: INITIAL  3.  Pt will be able to complete a static bridge for 90 sec and a static plank from knees for 30" to demonstrate improved core strength Baseline: bridge 30" Goal status: INITIAL  4.  Increase L SLR ROM to equal the R to improve both the muscular and neural flexibility Baseline:  Goal status: INITIAL  5.  Pt's FOTO score will improved to the predicted value of 63% as indication of  improved function  Baseline: 74% Goal status: INITIAL  PLAN:  PT FREQUENCY: 1x/week  PT DURATION: 8 weeks  PLANNED INTERVENTIONS: 97164- PT Re-evaluation, 97110-Therapeutic exercises, 97530- Therapeutic activity, [id number]- Neuromuscular re-education, 97535- Self Care, 77824- Manual therapy, [id number]- Aquatic Therapy, [id number]- Electrical stimulation (manual), Patient/Family education, Taping, Dry Needling, Spinal mobilization, Cryotherapy, and Moist heat.  PLAN FOR NEXT SESSION: Review FOTO; assess response to HEP; body mechanics; progress therex as indicated; use of modalities, manual therapy, and TPDN as indicated.  Kamiya Acord MS, PT 06/15/23 9:47 AM

## 2023-06-15 ENCOUNTER — Ambulatory Visit: Payer: Medicaid Other

## 2023-06-15 DIAGNOSIS — M5416 Radiculopathy, lumbar region: Secondary | ICD-10-CM | POA: Diagnosis not present

## 2023-06-15 DIAGNOSIS — M5442 Lumbago with sciatica, left side: Secondary | ICD-10-CM

## 2023-06-15 DIAGNOSIS — M5116 Intervertebral disc disorders with radiculopathy, lumbar region: Secondary | ICD-10-CM | POA: Diagnosis not present

## 2023-06-15 DIAGNOSIS — M6281 Muscle weakness (generalized): Secondary | ICD-10-CM | POA: Diagnosis not present

## 2023-06-15 DIAGNOSIS — G8929 Other chronic pain: Secondary | ICD-10-CM | POA: Diagnosis not present

## 2023-06-18 NOTE — Therapy (Signed)
OUTPATIENT PHYSICAL THERAPY THORACOLUMBAR TREATMENT   Patient Name: Dana Allison MRN: [id number] DOB:1985-12-08, 38 y.o., female Today's Date: 06/19/2023  END OF SESSION:  PT End of Session - 06/19/23 0954     Visit Number 3    Number of Visits 9    Date for PT Re-Evaluation 08/10/23    Authorization Type [place] MEDICAID HEALTHY BLUE    PT Start Time 0940    PT Stop Time 1022    PT Time Calculation (min) 42 min    Activity Tolerance Patient tolerated treatment well    Behavior During Therapy [facility] for tasks assessed/performed               Past Medical History:  Diagnosis Date   Endometrial polyp 03/17/2019   Hypertension    Morbid obesity (HCC)    Pre-diabetes    per patient   Past Surgical History:  Procedure Laterality Date   HYSTEROSCOPY WITH D & C N/A 04/08/2019   Procedure: DILATATION AND CURETTAGE /HYSTEROSCOPY WITH ENDOMETRIAL POLYPECTOMY;  Surgeon: Hermina Staggers, MD;  Location: [facility] OR;  Service: Gynecology;  Laterality: N/A;   NO PAST SURGERIES     Patient Active Problem List   Diagnosis Date Noted   Low back pain 04/05/2023   BMI 40.0-44.9, adult (HCC) 09/21/2021   Amenorrhea, secondary 09/21/2021   Malpositioned intrauterine device (IUD) 03/28/2021   Severe dysplasia of cervix (CIN III) 12/20/2020   LGSIL on Pap smear of cervix 11/01/2020   Morbid obesity (HCC)    Hypertension    Pre-diabetes    DUB (dysfunctional uterine bleeding) 09/19/2018   OBESITY, NOS 07/26/2006    PCP: Hoy Register, MD   REFERRING PROVIDER: Juanda Chance, NP  REFERRING DIAG:  M54.16 (ICD-10-CM) - Lumbar radiculopathy  M51.16 (ICD-10-CM) - Intervertebral disc disorders with radiculopathy, lumbar region  M54.42,G89.29 (ICD-10-CM) - Chronic left-sided low back pain with left-sided sciatica    Rationale for Evaluation and Treatment: Rehabilitation  THERAPY DIAG:  Left-sided low back pain with left-sided sciatica, unspecified chronicity  Muscle weakness  (generalized)  ONSET DATE: Approx 2 months ago  SUBJECTIVE:                                                                                                                                                                                           SUBJECTIVE STATEMENT: Pt reports she is not experiencing low back pain and she is completing her HEP every other day.   Eval: Pt reports she was experiencing significant low back pain and L LE pain that started insidiously about 2 months ago. Since then, the pain has improve a great deal  with pt est. 95% improvement. A muscle relaxer and prednisone, and rest have been helpful with relieving the pain.   PERTINENT HISTORY:  High BMI  PAIN:  Are you having pain? Yes: NPRS scale: 0/10 Pain location: L low back and L LE Pain description: constant ache Aggravating factors: Many different movements Relieving factors: Medications, rest  PRECAUTIONS: None  RED FLAGS: None   WEIGHT BEARING RESTRICTIONS: No  FALLS:  Has patient fallen in last 6 months? 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  DIAGNOSTIC FINDINGS:  Lumbar spine MRI 04/23/23 IMPRESSION: 1. Broad-based Left paracentral disc protrusion at L4-L5 with annular fissure. Up to moderate associated left foraminal and lateral lateral recess stenosis without spinal stenosis. Query Left L4 and/or L5 radiculitis (respectively). 2. No other significant lumbar disc degeneration. Multilevel lumbar mild facet, posterior element degeneration. No other onvincing neural impingement.   PATIENT SURVEYS:  FOTO: Perceived function   63%, predicted   72%   COGNITION: Overall cognitive status: Within functional  limits for tasks assessed     SENSATION: WFL  MUSCLE LENGTH: Hamstrings: Right 50 deg; Left 40 deg Thomas test: Right WNLs deg; Left WNLs deg  POSTURE: forward head and out toeing  PALPATION: Currently not TTP  LUMBAR ROM:   AROM eval  Flexion Min limited, tightness low back  Extension Min limited, no p  Right lateral flexion Full, L LBP  Left lateral flexion Full, no p  Right rotation Full, no p  Left rotation Full, no p   (Blank rows = not tested)  LOWER EXTREMITY MMT:     Grossly WNLs  Right eval Left eval  Hip flexion 4 4  Hip extension 4 4  Hip abduction 4 4  Hip adduction    Hip internal rotation    Hip external rotation 4 4  Knee flexion 5 5  Knee extension 5 4+  Ankle dorsiflexion 5 4+  Ankle plantarflexion    Ankle inversion    Ankle eversion     (Blank rows = not tested)  LOWER EXTREMITY ROM:     Grossly WNLs MMT Right eval Left eval  Hip flexion    Hip extension    Hip abduction    Hip adduction    Hip internal rotation    Hip external rotation    Knee flexion    Knee extension    Ankle dorsiflexion    Ankle plantarflexion    Ankle inversion    Ankle eversion     (Blank rows = not tested)  LUMBAR SPECIAL TESTS:  Straight leg raise test: L tighter and mildly more symptomatic than R and Slump test: L tighter and mildly more symptomatic than R   FUNCTIONAL TESTS:  Static bridging 30"  GAIT: Distance walked: 200' Assistive device utilized: None Level of assistance: Complete Independence Comments: Out toeing bilat  TREATMENT DATE:    [facility] Adult PT Treatment:                                                DATE: 06/19/23 Therapeutic Exercise: Nustep 5 mins L5 UE/LE while taking subjective Hooklying  Single Knee to Chest Stretch 1 reps - 30 hold Supine Sciatic Nerve Glide 1 sets - 10 reps TA set x10 TA set c marching 2x10 BluTB Supine Bridge Banded BluTB 10 reps - 5 hold Hooklying Clamshell 10 reps - 5 hold BluTB Prone press ups  x10 Prone alt leg lifts x15 each STS c with hindged hip x10 Palloff press x10 each GTB Updated HEP  [facility] Adult PT Treatment:                                                DATE: 06/15/23 Therapeutic Exercise: Nustep 5 mins L5 UE/LE while taking subjective Hooklying Single Knee to Chest Stretch 2 reps - 20 hold Supine Sciatic Nerve Glide 1 sets - 10 reps TA set x10 TA set c marching 2x10 BluTB Supine Bridge 15 reps - 5 hold Hooklying Clamshell 15 reps - 5 hold BluTB Prone press ups x10 Prone alt leg lifts x15 each Updated HEP                                                                                                                            [facility] Adult PT Treatment:                                                DATE: 06/07/23 Therapeutic Exercise: Developed, instructed in, and pt completed therex as noted in HEP   PATIENT EDUCATION:  Education details: Eval findings, POC, HEP Person educated: Patient Education method: Explanation, Demonstration, Tactile cues, Verbal cues, and Handouts Education comprehension: verbalized understanding, returned demonstration, verbal cues required, and tactile cues required  HOME EXERCISE PROGRAM: Access Code: [email or web address] URL: [email or web address] Date: 06/19/2023 Prepared by: Joellyn Rued  Exercises - Hooklying Single Knee to Chest Stretch  - 2 x daily - 7 x weekly - 1 sets - 3 reps - 20 hold - Supine Sciatic Nerve Glide  - 2 x daily - 7 x weekly - 3 sets - 10 reps - Supine Bridge  - 2 x daily - 7 x weekly - 1 sets - 15 reps - 5 hold - Hooklying Clamshell with Resistance  - 2 x daily - 7 x weekly - 1 sets - 15 reps - 5 hold - Supine Transversus Abdominis Bracing - Hands on Ground  - 1 x daily - 7 x weekly - 1 sets - 15 reps - Supine March with Resistance Band  - 1 x daily - 7 x weekly - 2 sets - 15 reps - Beginner Prone Single Leg Raise  - 1 x daily - 7 x weekly - 1 sets - 15 reps - 2 hold - Prone Press Up  - 1 x daily -  7 x  weekly - 1 sets - 10 reps - Sit to Stand Without Arm Support  - 1 x daily - 7 x weekly - 2 sets - 10 reps - Anti-Rotation Lateral Stepping with Press  - 1 x daily - 7 x weekly - 1 sets - 10 reps  ASSESSMENT:  CLINICAL IMPRESSION: PT was continued for lumbopelvic/neural mobility and core strengthening. Hinged hip STS was completed to initiate proper lifting technique. Therex/HEP was updated and pt completed properly. Will initiate lifting the nexy PT session. Pt tolerated the prescribed therex today without adverse effects. Pt will continue to benefit from skilled PT to address impairments for improved back function.    Eval: Patient is a 38 y.o. female who was seen today for physical therapy evaluation and treatment for  M54.16 (ICD-10-CM) - Lumbar radiculopathy  M51.16 (ICD-10-CM) - Intervertebral disc disorders with radiculopathy, lumbar region  M54.42,G89.29 (ICD-10-CM) - Chronic left-sided low back pain with left-sided sciatica  Pt presents with L low back and L LE pain which has been gradually improving with the use of a muscle relaxer and steroid. Pt demonstrates decreased L quad and ankle DF strength indicative L4, L5 nerve root involvement. Additionally, pt has decreased hip and core strength. Pt was started on an initial HEP. Pt will benefit from skilled PT 1w8 to address impairments to optimize back function.   OBJECTIVE IMPAIRMENTS: decreased activity tolerance, decreased ROM, decreased strength, obesity, and pain.   ACTIVITY LIMITATIONS: carrying, lifting, bending, sitting, standing, and locomotion level  PARTICIPATION LIMITATIONS: meal prep, cleaning, laundry, and occupation  PERSONAL FACTORS: Fitness, Past/current experiences, Time since onset of injury/illness/exacerbation, and 1 comorbidity: high BMI  are also affecting patient's functional outcome.   REHAB POTENTIAL: Good  CLINICAL DECISION MAKING: Evolving/moderate complexity  EVALUATION COMPLEXITY:  Moderate   GOALS:  SHORT TERM GOALS: Target date: 06/29/23  Pt will be Ind in an initial HEP  Baseline: started Goal status: ONGOING   LONG TERM GOALS: Target date: 08/10/23  Pt will be Ind in a final HEP to maintain achieved LOF  Baseline: started Goal status: INITIAL  2.  Increased bilat hip strength to 4+ and L ankle DF and knee ext to 5/5 for improved functional mobility Baseline: see flow sheets Goal status: INITIAL  3.  Pt will be able to complete a static bridge for 90 sec and a static plank from knees for 30" to demonstrate improved core strength Baseline: bridge 30" Goal status: INITIAL  4.  Increase L SLR ROM to equal the R to improve both the muscular and neural flexibility Baseline:  Goal status: INITIAL  5.  Pt's FOTO score will improved to the predicted value of 63% as indication of improved function  Baseline: 74% Goal status: INITIAL  PLAN:  PT FREQUENCY: 1x/week  PT DURATION: 8 weeks  PLANNED INTERVENTIONS: 97164- PT Re-evaluation, 97110-Therapeutic exercises, 97530- Therapeutic activity, [id number]- Neuromuscular re-education, 97535- Self Care, 21308- Manual therapy, [id number]- Aquatic Therapy, [id number]- Electrical stimulation (manual), Patient/Family education, Taping, Dry Needling, Spinal mobilization, Cryotherapy, and Moist heat.  PLAN FOR NEXT SESSION: Review FOTO; assess response to HEP; body mechanics; progress therex as indicated; use of modalities, manual therapy, and TPDN as indicated.  Kitana Gage MS, PT 06/19/23 10:37 AM

## 2023-06-19 ENCOUNTER — Ambulatory Visit: Payer: Medicaid Other

## 2023-06-19 DIAGNOSIS — M5116 Intervertebral disc disorders with radiculopathy, lumbar region: Secondary | ICD-10-CM | POA: Diagnosis not present

## 2023-06-19 DIAGNOSIS — M6281 Muscle weakness (generalized): Secondary | ICD-10-CM | POA: Diagnosis not present

## 2023-06-19 DIAGNOSIS — M5442 Lumbago with sciatica, left side: Secondary | ICD-10-CM

## 2023-06-19 DIAGNOSIS — M5416 Radiculopathy, lumbar region: Secondary | ICD-10-CM | POA: Diagnosis not present

## 2023-06-19 DIAGNOSIS — G8929 Other chronic pain: Secondary | ICD-10-CM | POA: Diagnosis not present

## 2023-06-24 NOTE — Therapy (Signed)
OUTPATIENT PHYSICAL THERAPY THORACOLUMBAR TREATMENT   Patient Name: Dana Allison MRN: [id number] DOB:[birth year], 38 y.o., female Today's Date: 06/26/2023  END OF SESSION:  PT End of Session - 06/26/23 0941     Visit Number 4    Number of Visits 9    Date for PT Re-Evaluation 08/10/23    Authorization Type [facility] [facility] MEDICAID HEALTHY BLUE    Authorization Time Period Approved 6 visits 06/11/23-08/09/23    PT Start Time 0935    PT Stop Time 1018    PT Time Calculation (min) 43 min    Activity Tolerance Patient tolerated treatment well    Behavior During Therapy [facility] for tasks assessed/performed                Past Medical History:  Diagnosis Date   Endometrial polyp 03/17/2019   Hypertension    Morbid obesity (HCC)    Pre-diabetes    per patient   Past Surgical History:  Procedure Laterality Date   HYSTEROSCOPY WITH D & C N/A 04/08/2019   Procedure: DILATATION AND CURETTAGE /HYSTEROSCOPY WITH ENDOMETRIAL POLYPECTOMY;  Surgeon: Hermina Staggers, MD;  Location: [facility] OR;  Service: Gynecology;  Laterality: N/A;   NO PAST SURGERIES     Patient Active Problem List   Diagnosis Date Noted   Low back pain 04/05/2023   BMI 40.0-44.9, adult (HCC) 09/21/2021   Amenorrhea, secondary 09/21/2021   Malpositioned intrauterine device (IUD) 03/28/2021   Severe dysplasia of cervix (CIN III) 12/20/2020   LGSIL on Pap smear of cervix 11/01/2020   Morbid obesity (HCC)    Hypertension    Pre-diabetes    DUB (dysfunctional uterine bleeding) 09/19/2018   OBESITY, NOS 07/26/2006    PCP: Hoy Register, MD   REFERRING PROVIDER: Juanda Chance, NP  REFERRING DIAG:  M54.16 (ICD-10-CM) - Lumbar radiculopathy  M51.16 (ICD-10-CM) - Intervertebral disc disorders with radiculopathy, lumbar region  M54.42,G89.29 (ICD-10-CM) - Chronic left-sided low back pain with left-sided sciatica    Rationale for Evaluation and Treatment: Rehabilitation  THERAPY DIAG:  Left-sided low back pain with  left-sided sciatica, unspecified chronicity  Muscle weakness (generalized)  ONSET DATE: Approx 2 months ago  SUBJECTIVE:                                                                                                                                                                                           SUBJECTIVE STATEMENT: Pt reports she experienced low back pain the other day, she took ibuprofen and it resolved by the next day. Pt denies low back pain today.  Eval: Pt reports she was experiencing significant low back pain and L  LE pain that started insidiously about 2 months ago. Since then, the pain has improve a great deal with pt est. 95% improvement. A muscle relaxer and prednisone, and rest have been helpful with relieving the pain.   PERTINENT HISTORY:  High BMI  PAIN:  Are you having pain? Yes: NPRS scale: 0/10 Pain location: L low back and L LE Pain description: constant ache Aggravating factors: Many different movements Relieving factors: Medications, rest  PRECAUTIONS: None  RED FLAGS: None   WEIGHT BEARING RESTRICTIONS: No  FALLS:  Has patient fallen in last 6 months? 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  DIAGNOSTIC FINDINGS:  Lumbar spine MRI 04/23/23 IMPRESSION: 1. Broad-based Left paracentral disc protrusion at L4-L5 with annular fissure. Up to moderate associated left foraminal and lateral lateral recess stenosis without spinal stenosis. Query Left L4 and/or L5 radiculitis (respectively). 2. No other significant lumbar disc degeneration. Multilevel lumbar mild facet, posterior element degeneration. No other onvincing neural impingement.   PATIENT SURVEYS:  FOTO:  Perceived function   63%, predicted   72%   COGNITION: Overall cognitive status: Within functional limits for tasks assessed     SENSATION: WFL  MUSCLE LENGTH: Hamstrings: Right 50 deg; Left 40 deg Thomas test: Right WNLs deg; Left WNLs deg  POSTURE: forward head and out toeing  PALPATION: Currently not TTP  LUMBAR ROM:   AROM eval  Flexion Min limited, tightness low back  Extension Min limited, no p  Right lateral flexion Full, L LBP  Left lateral flexion Full, no p  Right rotation Full, no p  Left rotation Full, no p   (Blank rows = not tested)  LOWER EXTREMITY MMT:     Grossly WNLs  Right eval Left eval  Hip flexion 4 4  Hip extension 4 4  Hip abduction 4 4  Hip adduction    Hip internal rotation    Hip external rotation 4 4  Knee flexion 5 5  Knee extension 5 4+  Ankle dorsiflexion 5 4+  Ankle plantarflexion    Ankle inversion    Ankle eversion     (Blank rows = not tested)  LOWER EXTREMITY ROM:     Grossly WNLs MMT Right eval Left eval  Hip flexion    Hip extension    Hip abduction    Hip adduction    Hip internal rotation    Hip external rotation    Knee flexion    Knee extension    Ankle dorsiflexion    Ankle plantarflexion    Ankle inversion    Ankle eversion     (Blank rows = not tested)  LUMBAR SPECIAL TESTS:  Straight leg raise test: L tighter and mildly more symptomatic than R and Slump test: L tighter and mildly more symptomatic than R   FUNCTIONAL TESTS:  Static bridging 30"  GAIT: Distance walked: 200' Assistive device utilized: None Level of assistance: Complete Independence Comments: Out toeing bilat  TREATMENT DATE:    [facility] Adult PT Treatment:  DATE: 06/26/23 Therapeutic Exercise: Nustep 5 mins L5 UE/LE while taking subjective Hooklying Single Knee to Chest Stretch 1 reps - 30 hold Supine Sciatic Nerve Glide 1 sets - 10 reps TA set c marching 2x10 BluTB Supine Bridge  Banded BluTB 10 reps - 5 hold Hooklying Clamshell 10 reps - 5 hold BluTB Prone press ups x10 Prone alt leg lifts x15 each Hinged hip lifting 2x10 20# Partial lunges 2 x10 10# OH reaches x15 5# RDLs lifts c x 10 5# Self Care: Proper body mechanics for lifting and household activities  [facility] Adult PT Treatment:                                                DATE: 06/19/23 Therapeutic Exercise: Nustep 5 mins L5 UE/LE while taking subjective Hooklying Single Knee to Chest Stretch 1 reps - 30 hold Supine Sciatic Nerve Glide 1 sets - 10 reps TA set x10 TA set c marching 2x10 BluTB Supine Bridge Banded BluTB 10 reps - 5 hold Hooklying Clamshell 10 reps - 5 hold BluTB Prone press ups x10 Prone alt leg lifts x15 each STS c with hindged hip x10 Palloff press x10 each GTB Updated HEP  [facility] Adult PT Treatment:                                                DATE: 06/15/23 Therapeutic Exercise: Nustep 5 mins L5 UE/LE while taking subjective Hooklying Single Knee to Chest Stretch 2 reps - 20 hold Supine Sciatic Nerve Glide 1 sets - 10 reps TA set x10 TA set c marching 2x10 BluTB Supine Bridge 15 reps - 5 hold Hooklying Clamshell 15 reps - 5 hold BluTB Prone press ups x10 Prone alt leg lifts x15 each Updated HEP                                                                                                                            [facility] Adult PT Treatment:                                                DATE: 06/07/23 Therapeutic Exercise: Developed, instructed in, and pt completed therex as noted in HEP   PATIENT EDUCATION:  Education details: Eval findings, POC, HEP Person educated: Patient Education method: Explanation, Demonstration, Tactile cues, Verbal cues, and Handouts Education comprehension: verbalized understanding, returned demonstration, verbal cues required, and tactile cues required  HOME EXERCISE PROGRAM: Access Code: [email or web address] URL:  [email or web address] Date: 06/19/2023 Prepared by: Joellyn Rued  Exercises - Hooklying Single Knee to Chest Stretch  -  2 x daily - 7 x weekly - 1 sets - 3 reps - 20 hold - Supine Sciatic Nerve Glide  - 2 x daily - 7 x weekly - 3 sets - 10 reps - Supine Bridge  - 2 x daily - 7 x weekly - 1 sets - 15 reps - 5 hold - Hooklying Clamshell with Resistance  - 2 x daily - 7 x weekly - 1 sets - 15 reps - 5 hold - Supine Transversus Abdominis Bracing - Hands on Ground  - 1 x daily - 7 x weekly - 1 sets - 15 reps - Supine March with Resistance Band  - 1 x daily - 7 x weekly - 2 sets - 15 reps - Beginner Prone Single Leg Raise  - 1 x daily - 7 x weekly - 1 sets - 15 reps - 2 hold - Prone Press Up  - 1 x daily - 7 x weekly - 1 sets - 10 reps - Sit to Stand Without Arm Support  - 1 x daily - 7 x weekly - 2 sets - 10 reps - Anti-Rotation Lateral Stepping with Press  - 1 x daily - 7 x weekly - 1 sets - 10 reps  ASSESSMENT:  CLINICAL IMPRESSION: PT was completed for lumbopelvic/neural mobility and core strengthening with proper body mechanics for household activities incorporated into the therex. Pt demonstrated good understanding of the the body mechanics and tolerated the prescribed therex without adverse effects. Pt is responding positively to PT intervention.  Eval: Patient is a 38 y.o. female who was seen today for physical therapy evaluation and treatment for  M54.16 (ICD-10-CM) - Lumbar radiculopathy  M51.16 (ICD-10-CM) - Intervertebral disc disorders with radiculopathy, lumbar region  M54.42,G89.29 (ICD-10-CM) - Chronic left-sided low back pain with left-sided sciatica  Pt presents with L low back and L LE pain which has been gradually improving with the use of a muscle relaxer and steroid. Pt demonstrates decreased L quad and ankle DF strength indicative L4, L5 nerve root involvement. Additionally, pt has decreased hip and core strength. Pt was started on an initial HEP. Pt will  benefit from skilled PT 1w8 to address impairments to optimize back function.   OBJECTIVE IMPAIRMENTS: decreased activity tolerance, decreased ROM, decreased strength, obesity, and pain.   ACTIVITY LIMITATIONS: carrying, lifting, bending, sitting, standing, and locomotion level  PARTICIPATION LIMITATIONS: meal prep, cleaning, laundry, and occupation  PERSONAL FACTORS: Fitness, Past/current experiences, Time since onset of injury/illness/exacerbation, and 1 comorbidity: high BMI  are also affecting patient's functional outcome.   REHAB POTENTIAL: Good  CLINICAL DECISION MAKING: Evolving/moderate complexity  EVALUATION COMPLEXITY: Moderate   GOALS:  SHORT TERM GOALS: Target date: 06/29/23  Pt will be Ind in an initial HEP  Baseline: started Goal status: ONGOING   LONG TERM GOALS: Target date: 08/10/23  Pt will be Ind in a final HEP to maintain achieved LOF  Baseline: started Goal status: INITIAL  2.  Increased bilat hip strength to 4+ and L ankle DF and knee ext to 5/5 for improved functional mobility Baseline: see flow sheets Goal status: INITIAL  3.  Pt will be able to complete a static bridge for 90 sec and a static plank from knees for 30" to demonstrate improved core strength Baseline: bridge 30" Goal status: INITIAL  4.  Increase L SLR ROM to equal the R to improve both the muscular and neural flexibility Baseline:  Goal status: INITIAL  5.  Pt's FOTO score will improved to the  predicted value of 63% as indication of improved function  Baseline: 74% Goal status: INITIAL  PLAN:  PT FREQUENCY: 1x/week  PT DURATION: 8 weeks  PLANNED INTERVENTIONS: 97164- PT Re-evaluation, 97110-Therapeutic exercises, 97530- Therapeutic activity, [id number]- Neuromuscular re-education, 97535- Self Care, [id number]- Manual therapy, [id number]- Aquatic Therapy, [phone]- Electrical stimulation (manual), Patient/Family education, Taping, Dry Needling, Spinal mobilization, Cryotherapy, and Moist  heat.  PLAN FOR NEXT SESSION: Review FOTO; assess response to HEP; body mechanics; progress therex as indicated; use of modalities, manual therapy, and TPDN as indicated.  Enola Siebers MS, PT 06/26/23 10:22 AM

## 2023-06-26 ENCOUNTER — Ambulatory Visit: Payer: Medicaid Other

## 2023-06-26 DIAGNOSIS — M5116 Intervertebral disc disorders with radiculopathy, lumbar region: Secondary | ICD-10-CM | POA: Diagnosis not present

## 2023-06-26 DIAGNOSIS — G8929 Other chronic pain: Secondary | ICD-10-CM | POA: Diagnosis not present

## 2023-06-26 DIAGNOSIS — M6281 Muscle weakness (generalized): Secondary | ICD-10-CM | POA: Diagnosis not present

## 2023-06-26 DIAGNOSIS — M5442 Lumbago with sciatica, left side: Secondary | ICD-10-CM

## 2023-06-26 DIAGNOSIS — M5416 Radiculopathy, lumbar region: Secondary | ICD-10-CM | POA: Diagnosis not present

## 2023-07-02 ENCOUNTER — Ambulatory Visit: Payer: Medicaid Other | Attending: Physical Medicine and Rehabilitation | Admitting: Physical Therapy

## 2023-07-02 ENCOUNTER — Encounter: Payer: Self-pay | Admitting: Physical Therapy

## 2023-07-02 DIAGNOSIS — M6281 Muscle weakness (generalized): Secondary | ICD-10-CM | POA: Insufficient documentation

## 2023-07-02 DIAGNOSIS — M5442 Lumbago with sciatica, left side: Secondary | ICD-10-CM | POA: Insufficient documentation

## 2023-07-02 NOTE — Therapy (Signed)
OUTPATIENT PHYSICAL THERAPY THORACOLUMBAR TREATMENT   Patient Name: Dana Allison MRN: [id number] DOB:February 25, 1986, 38 y.o., female Today's Date: 07/02/2023  END OF SESSION:  PT End of Session - 07/02/23 0931     Visit Number 5    Number of Visits 9    Date for PT Re-Evaluation 08/10/23    Authorization Type [place] MEDICAID HEALTHY BLUE    Authorization Time Period Approved 6 visits 06/11/23-08/09/23    Authorization - Visit Number 4    Authorization - Number of Visits 6    PT Start Time 0932    PT Stop Time 1010    PT Time Calculation (min) 38 min                Past Medical History:  Diagnosis Date   Endometrial polyp 03/17/2019   Hypertension    Morbid obesity (HCC)    Pre-diabetes    per patient   Past Surgical History:  Procedure Laterality Date   HYSTEROSCOPY WITH D & C N/A 04/08/2019   Procedure: DILATATION AND CURETTAGE /HYSTEROSCOPY WITH ENDOMETRIAL POLYPECTOMY;  Surgeon: Hermina Staggers, MD;  Location: [facility] OR;  Service: Gynecology;  Laterality: N/A;   NO PAST SURGERIES     Patient Active Problem List   Diagnosis Date Noted   Low back pain 04/05/2023   BMI 40.0-44.9, adult (HCC) 09/21/2021   Amenorrhea, secondary 09/21/2021   Malpositioned intrauterine device (IUD) 03/28/2021   Severe dysplasia of cervix (CIN III) 12/20/2020   LGSIL on Pap smear of cervix 11/01/2020   Morbid obesity (HCC)    Hypertension    Pre-diabetes    DUB (dysfunctional uterine bleeding) 09/19/2018   OBESITY, NOS 07/26/2006    PCP: Hoy Register, MD   REFERRING PROVIDER: Juanda Chance, NP  REFERRING DIAG:  M54.16 (ICD-10-CM) - Lumbar radiculopathy  M51.16 (ICD-10-CM) - Intervertebral disc disorders with radiculopathy, lumbar region  M54.42,G89.29 (ICD-10-CM) - Chronic left-sided low back pain with left-sided sciatica    Rationale for Evaluation and Treatment: Rehabilitation  THERAPY DIAG:  Left-sided low back pain with left-sided sciatica, unspecified  chronicity  Muscle weakness (generalized)  ONSET DATE: Approx 2 months ago  SUBJECTIVE:                                                                                                                                                                                           SUBJECTIVE STATEMENT: No pain on arrival. Felt some soreness in muscles after last session but expects it.     Eval: Pt reports she was experiencing significant low back pain and L LE pain that started insidiously about 2 months ago. Since  then, the pain has improve a great deal with pt est. 95% improvement. A muscle relaxer and prednisone, and rest have been helpful with relieving the pain.   PERTINENT HISTORY:  High BMI  PAIN:  Are you having pain? Yes: NPRS scale: 0/10 Pain location: L low back and L LE Pain description: constant ache Aggravating factors: Many different movements Relieving factors: Medications, rest  PRECAUTIONS: None  RED FLAGS: None   WEIGHT BEARING RESTRICTIONS: No  FALLS:  Has patient fallen in last 6 months? 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  DIAGNOSTIC FINDINGS:  Lumbar spine MRI 04/23/23 IMPRESSION: 1. Broad-based Left paracentral disc protrusion at L4-L5 with annular fissure. Up to moderate associated left foraminal and lateral lateral recess stenosis without spinal stenosis. Query Left L4 and/or L5 radiculitis (respectively). 2. No other significant lumbar disc degeneration. Multilevel lumbar mild facet, posterior element degeneration. No other onvincing neural impingement.   PATIENT SURVEYS:  FOTO: Perceived function   63%, predicted   72%   COGNITION: Overall cognitive  status: Within functional limits for tasks assessed     SENSATION: WFL  MUSCLE LENGTH: Hamstrings: Right 50 deg; Left 40 deg Thomas test: Right WNLs deg; Left WNLs deg  POSTURE: forward head and out toeing  PALPATION: Currently not TTP  LUMBAR ROM:   AROM eval  Flexion Min limited, tightness low back  Extension Min limited, no p  Right lateral flexion Full, L LBP  Left lateral flexion Full, no p  Right rotation Full, no p  Left rotation Full, no p   (Blank rows = not tested)  LOWER EXTREMITY MMT:     Grossly WNLs  Right eval Left eval  Hip flexion 4 4  Hip extension 4 4  Hip abduction 4 4  Hip adduction    Hip internal rotation    Hip external rotation 4 4  Knee flexion 5 5  Knee extension 5 4+  Ankle dorsiflexion 5 4+  Ankle plantarflexion    Ankle inversion    Ankle eversion     (Blank rows = not tested)  LOWER EXTREMITY ROM:     Grossly WNLs MMT Right eval Left eval  Hip flexion    Hip extension    Hip abduction    Hip adduction    Hip internal rotation    Hip external rotation    Knee flexion    Knee extension    Ankle dorsiflexion    Ankle plantarflexion    Ankle inversion    Ankle eversion     (Blank rows = not tested)  LUMBAR SPECIAL TESTS:  Straight leg raise test: L tighter and mildly more symptomatic than R and Slump test: L tighter and mildly more symptomatic than R   FUNCTIONAL TESTS:  Static bridging 30"  GAIT: Distance walked: 200' Assistive device utilized: None Level of assistance: Complete Independence Comments: Out toeing bilat  TREATMENT DATE:    [facility] Adult PT Treatment:                                                DATE: 07/02/23 Therapeutic Exercise: Lora Paula  L5 x 5 minutes  Hooklying Single Knee to Chest Stretch 1 reps - 30 hold Supine Sciatic Nerve Glide 1 sets - 10 reps TA set c marching 2x10 BluTB Supine Bridge Banded BluTB 10 reps - 5 hold Hooklying Clamshell 10 reps - 5 hold BluTB AB brace with SLR x 8 each   LTR Prone press ups x10 Prone alt leg lifts x15 each  Therapeutic Activity: Hinged hip lifting 2x10 15# Partial lunges 2 x10 10# OH reaches x15 5# (5# KB OH press)    [facility] Adult PT Treatment:                                                DATE: 06/26/23 Therapeutic Exercise: Nustep 5 mins L5 UE/LE while taking subjective Hooklying Single Knee to Chest Stretch 1 reps - 30 hold Supine Sciatic Nerve Glide 1 sets - 10 reps TA set c marching 2x10 BluTB Supine Bridge Banded BluTB 10 reps - 5 hold Hooklying Clamshell 10 reps - 5 hold BluTB Prone press ups x10 Prone alt leg lifts x15 each Hinged hip lifting 2x10 20# Partial lunges 2 x10 10# OH reaches x15 5# RDLs lifts c x 10 5# Self Care: Proper body mechanics for lifting and household activities  [facility] Adult PT Treatment:                                                DATE: 06/19/23 Therapeutic Exercise: Nustep 5 mins L5 UE/LE while taking subjective Hooklying Single Knee to Chest Stretch 1 reps - 30 hold Supine Sciatic Nerve Glide 1 sets - 10 reps TA set x10 TA set c marching 2x10 BluTB Supine Bridge Banded BluTB 10 reps - 5 hold Hooklying Clamshell 10 reps - 5 hold BluTB Prone press ups x10 Prone alt leg lifts x15 each STS c with hindged hip x10 Palloff press x10 each GTB Updated HEP  [facility] Adult PT Treatment:                                                DATE: 06/15/23 Therapeutic Exercise: Nustep 5 mins L5 UE/LE while taking subjective Hooklying Single Knee to Chest Stretch 2 reps - 20 hold Supine Sciatic Nerve Glide 1 sets - 10 reps TA set x10 TA set c marching 2x10 BluTB Supine Bridge 15 reps - 5 hold Hooklying Clamshell 15 reps - 5 hold BluTB Prone press ups x10 Prone alt leg lifts x15 each Updated HEP  [facility] Adult PT Treatment:                                                DATE:  06/07/23 Therapeutic Exercise: Developed, instructed in, and pt completed therex as noted in HEP   PATIENT EDUCATION:  Education details: Eval findings, POC, HEP Person educated: Patient Education method: Explanation, Demonstration, Tactile cues, Verbal cues, and Handouts Education comprehension: verbalized understanding, returned demonstration, verbal cues required, and tactile cues required  HOME EXERCISE PROGRAM: Access Code: [email or web address] URL: [email or web address] Date: 06/19/2023 Prepared by: Joellyn Rued  Exercises - Hooklying Single Knee to Chest Stretch  - 2 x daily - 7 x weekly - 1 sets - 3 reps - 20 hold - Supine Sciatic Nerve Glide  - 2 x daily - 7 x weekly - 3 sets - 10 reps - Supine Bridge  - 2 x daily - 7 x weekly - 1 sets - 15 reps - 5 hold - Hooklying Clamshell with Resistance  - 2 x daily - 7 x weekly - 1 sets - 15 reps - 5 hold - Supine Transversus Abdominis Bracing - Hands on Ground  - 1 x daily - 7 x weekly - 1 sets - 15 reps - Supine March with Resistance Band  - 1 x daily - 7 x weekly - 2 sets - 15 reps - Beginner Prone Single Leg Raise  - 1 x daily - 7 x weekly - 1 sets - 15 reps - 2 hold - Prone Press Up  - 1 x daily - 7 x weekly - 1 sets - 10 reps - Sit to Stand Without Arm Support  - 1 x daily - 7 x weekly - 2 sets - 10 reps - Anti-Rotation Lateral Stepping with Press  - 1 x daily - 7 x weekly - 1 sets - 10 reps  ASSESSMENT:  CLINICAL IMPRESSION: PT was completed for lumbopelvic/neural mobility and core strengthening. Continued with review of lifting and hip hinging from last session with pt requiring reinforcement and cues.     Eval: Patient is a 38 y.o. female who was seen today for physical therapy evaluation and treatment for  M54.16 (ICD-10-CM) - Lumbar radiculopathy  M51.16 (ICD-10-CM) - Intervertebral disc disorders with radiculopathy, lumbar region  M54.42,G89.29 (ICD-10-CM) - Chronic left-sided low back pain with left-sided sciatica  Pt  presents with L low back and L LE pain which has been gradually improving with the use of a muscle relaxer and steroid. Pt demonstrates decreased L quad and ankle DF strength indicative L4, L5 nerve root involvement. Additionally, pt has decreased hip and core strength. Pt was started on an initial HEP. Pt will benefit from skilled PT 1w8 to address impairments to optimize back function.   OBJECTIVE IMPAIRMENTS: decreased activity tolerance, decreased ROM, decreased strength, obesity, and pain.   ACTIVITY LIMITATIONS: carrying, lifting, bending, sitting, standing, and locomotion level  PARTICIPATION LIMITATIONS: meal prep, cleaning, laundry, and occupation  PERSONAL FACTORS: Fitness, Past/current experiences, Time since onset of injury/illness/exacerbation, and 1 comorbidity: high BMI  are also affecting patient's functional outcome.   REHAB POTENTIAL: Good  CLINICAL DECISION MAKING: Evolving/moderate complexity  EVALUATION COMPLEXITY: Moderate   GOALS:  SHORT TERM GOALS: Target date: 06/29/23  Pt will be Ind in an initial HEP  Baseline: started Goal status: ONGOING   LONG TERM GOALS: Target date: 08/10/23  Pt will be Ind in a final HEP to maintain achieved LOF  Baseline: started Goal status: INITIAL  2.  Increased bilat hip strength to 4+ and L ankle DF and knee ext to 5/5 for improved functional mobility Baseline: see flow sheets Goal status: INITIAL  3.  Pt will be able to complete a static bridge for 90 sec and a static plank from knees for 30" to demonstrate improved core strength Baseline: bridge 30" Goal status: INITIAL  4.  Increase L SLR ROM to equal the R to improve both the muscular and neural flexibility Baseline:  Goal status: INITIAL  5.  Pt's FOTO score will improved to the predicted value of 63% as indication of improved function  Baseline: 74% Goal status: INITIAL  PLAN:  PT FREQUENCY: 1x/week  PT DURATION: 8 weeks  PLANNED INTERVENTIONS: 97164- PT  Re-evaluation, 97110-Therapeutic exercises, 97530- Therapeutic activity, [id number]- Neuromuscular re-education, 97535- Self Care, [id number]- Manual therapy, [id number]- Aquatic Therapy, [id number]- Electrical stimulation (manual), Patient/Family education, Taping, Dry Needling, Spinal mobilization, Cryotherapy, and Moist heat.  PLAN FOR NEXT SESSION: Review FOTO; assess response to HEP; body mechanics; progress therex as indicated; use of modalities, manual therapy, and TPDN as indicated.  Jannette Spanner, PTA 07/02/23 1:22 PM Phone: [phone] Fax: [phone]

## 2023-07-09 ENCOUNTER — Ambulatory Visit (INDEPENDENT_AMBULATORY_CARE_PROVIDER_SITE_OTHER): Payer: Medicaid Other | Admitting: Physical Medicine and Rehabilitation

## 2023-07-09 ENCOUNTER — Encounter: Payer: Self-pay | Admitting: Physical Medicine and Rehabilitation

## 2023-07-09 DIAGNOSIS — G8929 Other chronic pain: Secondary | ICD-10-CM | POA: Diagnosis not present

## 2023-07-09 DIAGNOSIS — M5416 Radiculopathy, lumbar region: Secondary | ICD-10-CM

## 2023-07-09 DIAGNOSIS — M5116 Intervertebral disc disorders with radiculopathy, lumbar region: Secondary | ICD-10-CM | POA: Diagnosis not present

## 2023-07-09 DIAGNOSIS — M5442 Lumbago with sciatica, left side: Secondary | ICD-10-CM | POA: Diagnosis not present

## 2023-07-09 NOTE — Therapy (Signed)
OUTPATIENT PHYSICAL THERAPY THORACOLUMBAR TREATMENT   Patient Name: Dana Allison MRN: [id number] DOB:[birth year], 38 y.o., female Today's Date: 07/10/2023  END OF SESSION:  PT End of Session - 07/10/23 0934     Visit Number 6    Number of Visits 9    Date for PT Re-Evaluation 08/10/23    Authorization Type [place] MEDICAID HEALTHY BLUE    Authorization Time Period Approved 6 visits 06/11/23-08/09/23    Authorization - Visit Number 5    Authorization - Number of Visits 6    PT Start Time 0934    PT Stop Time 1015    PT Time Calculation (min) 41 min    Activity Tolerance Patient tolerated treatment well    Behavior During Therapy [facility] for tasks assessed/performed                 Past Medical History:  Diagnosis Date   Endometrial polyp 03/17/2019   Hypertension    Morbid obesity (HCC)    Pre-diabetes    per patient   Past Surgical History:  Procedure Laterality Date   HYSTEROSCOPY WITH D & C N/A 04/08/2019   Procedure: DILATATION AND CURETTAGE /HYSTEROSCOPY WITH ENDOMETRIAL POLYPECTOMY;  Surgeon: Hermina Staggers, MD;  Location: [facility] OR;  Service: Gynecology;  Laterality: N/A;   NO PAST SURGERIES     Patient Active Problem List   Diagnosis Date Noted   Low back pain 04/05/2023   BMI 40.0-44.9, adult (HCC) 09/21/2021   Amenorrhea, secondary 09/21/2021   Malpositioned intrauterine device (IUD) 03/28/2021   Severe dysplasia of cervix (CIN III) 12/20/2020   LGSIL on Pap smear of cervix 11/01/2020   Morbid obesity (HCC)    Hypertension    Pre-diabetes    DUB (dysfunctional uterine bleeding) 09/19/2018   OBESITY, NOS 07/26/2006    PCP: Hoy Register, MD   REFERRING PROVIDER: Juanda Chance, NP  REFERRING DIAG:  M54.16 (ICD-10-CM) - Lumbar radiculopathy  M51.16 (ICD-10-CM) - Intervertebral disc disorders with radiculopathy, lumbar region  M54.42,G89.29 (ICD-10-CM) - Chronic left-sided low back pain with left-sided sciatica    Rationale for Evaluation and  Treatment: Rehabilitation  THERAPY DIAG:  Left-sided low back pain with left-sided sciatica, unspecified chronicity  Muscle weakness (generalized)  ONSET DATE: Approx 2 months ago  SUBJECTIVE:                                                                                                                                                                                           SUBJECTIVE STATEMENT: Overall, pt reports low back pain is much better.  Eval: Pt reports she was experiencing significant low back pain and  L LE pain that started insidiously about 2 months ago. Since then, the pain has improve a great deal with pt est. 95% improvement. A muscle relaxer and prednisone, and rest have been helpful with relieving the pain.   PERTINENT HISTORY:  High BMI  PAIN:  Are you having pain? Yes: NPRS scale: 0/10 Pain location: L low back and L LE Pain description: constant ache Aggravating factors: Many different movements Relieving factors: Medications, rest  PRECAUTIONS: None  RED FLAGS: None   WEIGHT BEARING RESTRICTIONS: No  FALLS:  Has patient fallen in last 6 months? 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  DIAGNOSTIC FINDINGS:  Lumbar spine MRI 04/23/23 IMPRESSION: 1. Broad-based Left paracentral disc protrusion at L4-L5 with annular fissure. Up to moderate associated left foraminal and lateral lateral recess stenosis without spinal stenosis. Query Left L4 and/or L5 radiculitis (respectively). 2. No other significant lumbar disc degeneration. Multilevel lumbar mild facet, posterior element degeneration. No other onvincing neural impingement.   PATIENT SURVEYS:  FOTO: Perceived  function   63%, predicted   72%   COGNITION: Overall cognitive status: Within functional limits for tasks assessed     SENSATION: WFL  MUSCLE LENGTH: Hamstrings: Right 50 deg; Left 40 deg Thomas test: Right WNLs deg; Left WNLs deg  POSTURE: forward head and out toeing  PALPATION: Currently not TTP  LUMBAR ROM:   AROM eval  Flexion Min limited, tightness low back  Extension Min limited, no p  Right lateral flexion Full, L LBP  Left lateral flexion Full, no p  Right rotation Full, no p  Left rotation Full, no p   (Blank rows = not tested)  LOWER EXTREMITY MMT:     Grossly WNLs  Right eval Left eval Rt 07/10/23 LT 07/10/23  Hip flexion 4 4 4+ 4+  Hip extension 4 4 4+ 4+  Hip abduction 4 4 4+ 4+  Hip adduction      Hip internal rotation      Hip external rotation 4 4 4+ 4+  Knee flexion 5 5    Knee extension 5 4+    Ankle dorsiflexion 5 4+    Ankle plantarflexion      Ankle inversion      Ankle eversion       (Blank rows = not tested)  LOWER EXTREMITY ROM:     Grossly WNLs MMT Right eval Left eval  Hip flexion    Hip extension    Hip abduction    Hip adduction    Hip internal rotation    Hip external rotation    Knee flexion    Knee extension    Ankle dorsiflexion    Ankle plantarflexion    Ankle inversion    Ankle eversion     (Blank rows = not tested)  LUMBAR SPECIAL TESTS:  Straight leg raise test: L tighter and mildly more symptomatic than R and Slump test: L tighter and mildly more symptomatic than R   FUNCTIONAL TESTS:  Static bridging 30"  GAIT: Distance walked: 200' Assistive device utilized: None Level of assistance: Complete Independence Comments: Out toeing bilat  TREATMENT DATE:  [facility] Adult PT Treatment:  DATE: 07/10/23 Therapeutic Exercise: Nustep L5 x 5 minutes  Supine Sciatic Nerve Glide @ sets - 10 reps TA set c marching 2x10 BluTB Supine Bridge Banded BluTB 10 reps - 5  hold Hooklying Clamshell 10 reps - 5 hold BluTB Prone press ups x10 Prone alt leg lifts x15 each MMT Therapeutic Activity: Hinged hip lifting 2x15 15# Shoulder and OH bilat lifting x10 10# each lift  [facility] Adult PT Treatment:                                                DATE: 07/02/23 Therapeutic Exercise: Nustep L5 x 5 minutes  Hooklying Single Knee to Chest Stretch 1 reps - 30 hold Supine Sciatic Nerve Glide 1 sets - 10 reps TA set c marching 2x10 BluTB Supine Bridge Banded BluTB 10 reps - 5 hold Hooklying Clamshell 10 reps - 5 hold BluTB AB brace with SLR x 8 each  LTR Prone press ups x10 Prone alt leg lifts x15 each  Therapeutic Activity: Hinged hip lifting 2x10 15# Partial lunges 2 x10 10# OH reaches x15 5# (5# KB OH press)  [facility] Adult PT Treatment:                                                DATE: 06/26/23 Therapeutic Exercise: Nustep 5 mins L5 UE/LE while taking subjective Hooklying Single Knee to Chest Stretch 1 reps - 30 hold Supine Sciatic Nerve Glide 1 sets - 10 reps TA set c marching 2x10 BluTB Supine Bridge Banded BluTB 10 reps - 5 hold Hooklying Clamshell 10 reps - 5 hold BluTB Prone press ups x10 Prone alt leg lifts x15 each Hinged hip lifting 2x10 20# Partial lunges 2 x10 10# OH reaches x15 5# RDLs lifts c x 10 5# Self Care: Proper body mechanics for lifting and household activities  PATIENT EDUCATION:  Education details: Eval findings, POC, HEP Person educated: Patient Education method: Explanation, Demonstration, Tactile cues, Verbal cues, and Handouts Education comprehension: verbalized understanding, returned demonstration, verbal cues required, and tactile cues required  HOME EXERCISE PROGRAM: Access Code: [email or web address] URL: [email or web address] Date: 06/19/2023 Prepared by: Joellyn Rued  Exercises - Hooklying Single Knee to Chest Stretch  - 2 x daily - 7 x weekly - 1 sets - 3 reps - 20 hold - Supine Sciatic Nerve Glide  - 2 x  daily - 7 x weekly - 3 sets - 10 reps - Supine Bridge  - 2 x daily - 7 x weekly - 1 sets - 15 reps - 5 hold - Hooklying Clamshell with Resistance  - 2 x daily - 7 x weekly - 1 sets - 15 reps - 5 hold - Supine Transversus Abdominis Bracing - Hands on Ground  - 1 x daily - 7 x weekly - 1 sets - 15 reps - Supine March with Resistance Band  - 1 x daily - 7 x weekly - 2 sets - 15 reps - Beginner Prone Single Leg Raise  - 1 x daily - 7 x weekly - 1 sets - 15 reps - 2 hold - Prone Press Up  - 1 x daily - 7 x weekly - 1 sets - 10 reps - Sit to Stand Without Arm Support  -  1 x daily - 7 x weekly - 2 sets - 10 reps - Anti-Rotation Lateral Stepping with Press  - 1 x daily - 7 x weekly - 1 sets - 10 reps  ASSESSMENT:  CLINICAL IMPRESSION: PT completed PT for lumbopelvic/neural mobility and core strengthening. Re-assessed hip strength which was found improved, meeting this goal. L LE SLR ROM was also re-assessed, meeting it's goal. Continued with review of lifting and hip hinging and pt demonstrated proper technique this session. Pt is making good progress with PT intervention. Pt's last appt will be next week. Will complete final re-assessment and provide final HEP. It pt continues to do well, anticipate DC from PT services at that time.  Eval: Patient is a 38 y.o. female who was seen today for physical therapy evaluation and treatment for  M54.16 (ICD-10-CM) - Lumbar radiculopathy  M51.16 (ICD-10-CM) - Intervertebral disc disorders with radiculopathy, lumbar region  M54.42,G89.29 (ICD-10-CM) - Chronic left-sided low back pain with left-sided sciatica  Pt presents with L low back and L LE pain which has been gradually improving with the use of a muscle relaxer and steroid. Pt demonstrates decreased L quad and ankle DF strength indicative L4, L5 nerve root involvement. Additionally, pt has decreased hip and core strength. Pt was started on an initial HEP. Pt will benefit from skilled PT 1w8 to address  impairments to optimize back function.   OBJECTIVE IMPAIRMENTS: decreased activity tolerance, decreased ROM, decreased strength, obesity, and pain.   ACTIVITY LIMITATIONS: carrying, lifting, bending, sitting, standing, and locomotion level  PARTICIPATION LIMITATIONS: meal prep, cleaning, laundry, and occupation  PERSONAL FACTORS: Fitness, Past/current experiences, Time since onset of injury/illness/exacerbation, and 1 comorbidity: high BMI  are also affecting patient's functional outcome.   REHAB POTENTIAL: Good  CLINICAL DECISION MAKING: Evolving/moderate complexity  EVALUATION COMPLEXITY: Moderate   GOALS:  SHORT TERM GOALS: Target date: 06/29/23  Pt will be Ind in an initial HEP  Baseline: started Goal status: MET   LONG TERM GOALS: Target date: 08/10/23  Pt will be Ind in a final HEP to maintain achieved LOF  Baseline: started Goal status: INITIAL  2.  Increased bilat hip strength to 4+ and L ankle DF and knee ext to 5/5 for improved functional mobility Baseline: see flow sheets Goal status: MET  3.  Pt will be able to complete a static bridge for 90 sec and a static plank from knees for 30" to demonstrate improved core strength Baseline: bridge 30" Goal status: INITIAL  4.  Increase L SLR ROM to equal the R to improve both the muscular and neural flexibility Baseline: 40d 07/10/23: 50d Goal status: MET  5.  Pt's FOTO score will improved to the predicted value of 63% as indication of improved function  Baseline: 74% Goal status: DEFERRED  PLAN:  PT FREQUENCY: 1x/week  PT DURATION: 8 weeks  PLANNED INTERVENTIONS: 97164- PT Re-evaluation, 97110-Therapeutic exercises, 97530- Therapeutic activity, [id number]- Neuromuscular re-education, 97535- Self Care, 19147- Manual therapy, [id number]- Aquatic Therapy, [id number]- Electrical stimulation (manual), Patient/Family education, Taping, Dry Needling, Spinal mobilization, Cryotherapy, and Moist heat.  PLAN FOR NEXT SESSION: Review  FOTO; assess response to HEP; body mechanics; progress therex as indicated; use of modalities, manual therapy, and TPDN as indicated.  Travius Crochet MS, PT 07/10/23 10:41 AM

## 2023-07-09 NOTE — Progress Notes (Signed)
Dana Allison - 38 y.o. female MRN [id number]  Date of birth: 02/13/1986  Office Visit Note: Visit Date: 07/09/2023 PCP: Hoy Register, MD Referred by: Hoy Register, MD  Subjective: Chief Complaint  Patient presents with   Lower Back - Pain   HPI: Dana Allison is a 38 y.o. female who comes in today for evaluation of chronic left sided lower back pain radiating to left thigh down to knee. Patient is here today in follow up from ongoing regimen of formal physical therapy. Her pain worsens with bending and activity, she describes pain as sore and aching sensation, currently denies pain at this time. Good relief of pain with ongoing physical therapy, home exercise regimen and OTC medications. She feels much better today and reports increased functional ability. Lumbar MRI imaging from December of 2024 shows left paracentral disc protrusion at L4-L5, moderate left foraminal and lateral recess stenosis at this level. No high grade spinal canal stenosis noted. She continues to work full time at [employer]. Patient denies focal weakness, numbness and tingling. No recent trauma or falls.      Review of Systems  Musculoskeletal:  Negative for back pain.  Neurological:  Negative for tingling, sensory change, focal weakness and weakness.  All other systems reviewed and are negative.  Otherwise per HPI.  Assessment & Plan: Visit Diagnoses:    ICD-10-CM   1. Chronic left-sided low back pain with left-sided sciatica  M54.42    G89.29     2. Lumbar radiculopathy  M54.16     3. Intervertebral disc disorders with radiculopathy, lumbar region  M51.16        Plan: Findings:  Chronic left sided lower back pain radiating to left thigh down to knee. She continues to with formal physical therapy, home exercise regimen and use of medication. She is currently pain free and reports significant relief of pain with physical therapy. Patients clinical presentation and exam are consistent with  both L4 and L5 nerve patterns, there is left paracentral disc herniation and annular fissure at the level of L4-L5 on recent lumbar MRI imaging. I explained to her that disc herniations typically re-absorb over time. At this point, would recommend continued monitoring. Should her pain persist would recommend diagnostic lumbar epidural steroid injection. I encouraged patient to continue with physician directed home exercise regimen, she can follow up with as needed. She has no questions at this time. No red flag symptoms noted upon exam today.     Meds & Orders: No orders of the defined types were placed in this encounter.  No orders of the defined types were placed in this encounter.   Follow-up: Return if symptoms worsen or fail to improve.   Procedures: No procedures performed      Clinical History: CLINICAL DATA:  38 year old female with low back pain, bilateral sciatica.   EXAM: MRI LUMBAR SPINE WITHOUT CONTRAST   TECHNIQUE: Multiplanar, multisequence MR imaging of the lumbar spine was performed. No intravenous contrast was administered.   COMPARISON:  Lumbar radiographs 04/02/2023.   FINDINGS: Segmentation:  Normal on the comparison.   Alignment: Stable mild straightening of lumbar lordosis. Subtle dextroconvex lumbar scoliosis on the comparison. No spondylolisthesis.   Vertebrae: Visualized bone marrow signal is within normal limits. No marrow edema or evidence of acute osseous abnormality. Intact visible sacrum. Maintained vertebral body height.   Conus medullaris and cauda equina: Conus extends to the L1 level. No lower spinal cord or conus signal abnormality. Normal cauda equina nerve roots.  Paraspinal and other soft tissues: Negative.   Disc levels:   Visible lower thoracic levels through   T12-L1:  Negative.   L1-L2:  Negative disc.  Mild facet hypertrophy.  No stenosis.   L2-L3: Negative disc. Mild facet hypertrophy. Mild endplate spurring. No  stenosis.   L3-L4: Negative disc. Mild facet and ligament flavum hypertrophy. Mild endplate spurring. No convincing stenosis.   L4-L5: Broad-based left paracentral disc protrusion with annular fissure (series 3, image 9 and series 6, image 28). Less pronounced superimposed facet hypertrophy at this level. Mild to moderate left lateral recess stenosis without spinal stenosis (left L5 nerve level). Mild to moderate left L4 neural foraminal stenosis. No convincing right foraminal stenosis.   L5-S1: Negative disc. Mild facet hypertrophy. No convincing stenosis.   IMPRESSION: 1. Broad-based Left paracentral disc protrusion at L4-L5 with annular fissure. Up to moderate associated left foraminal and lateral lateral recess stenosis without spinal stenosis. Query Left L4 and/or L5 radiculitis (respectively). 2. No other significant lumbar disc degeneration. Multilevel lumbar mild facet, posterior element degeneration. No other onvincing neural impingement.     Electronically Signed   By: Odessa Fleming M.D.   On: 05/03/2023 09:42   She reports that she has never smoked. She has never used smokeless tobacco.  Recent Labs    11/08/22 1547  HGBA1C 5.8    Objective:  VS:  HT:    WT:   BMI:     BP:   HR: bpm  TEMP: ( )  RESP:  Physical Exam Vitals and nursing note reviewed.  HENT:     Head: Normocephalic and atraumatic.     Right Ear: External ear normal.     Left Ear: External ear normal.     Nose: Nose normal.     Mouth/Throat:     Mouth: Mucous membranes are moist.  Eyes:     Extraocular Movements: Extraocular movements intact.  Cardiovascular:     Rate and Rhythm: Normal rate.     Pulses: Normal pulses.  Pulmonary:     Effort: Pulmonary effort is normal.  Abdominal:     General: Abdomen is flat. There is no distension.  Musculoskeletal:        General: Normal range of motion.     Cervical back: Normal range of motion.     Comments: Patient rises from seated position to  standing without difficulty. Good lumbar range of motion. No pain noted with facet loading. 5/5 strength noted with bilateral hip flexion, knee flexion/extension, ankle dorsiflexion/plantarflexion and EHL. No clonus noted bilaterally. No pain upon palpation of greater trochanters. No pain with internal/external rotation of bilateral hips. Sensation intact bilaterally. Negative slump test bilaterally. Ambulates without aid, gait steady.     Skin:    General: Skin is warm and dry.     Capillary Refill: Capillary refill takes less than 2 seconds.  Neurological:     General: No focal deficit present.     Mental Status: She is alert and oriented to person, place, and time.  Psychiatric:        Mood and Affect: Mood normal.        Behavior: Behavior normal.     Ortho Exam  Imaging: No results found.  Past Medical/Family/Surgical/Social History: Medications & Allergies reviewed per EMR, new medications updated. Patient Active Problem List   Diagnosis Date Noted   Low back pain 04/05/2023   BMI 40.0-44.9, adult (HCC) 09/21/2021   Amenorrhea, secondary 09/21/2021   Malpositioned intrauterine device (IUD)  03/28/2021   Severe dysplasia of cervix (CIN III) 12/20/2020   LGSIL on Pap smear of cervix 11/01/2020   Morbid obesity (HCC)    Hypertension    Pre-diabetes    DUB (dysfunctional uterine bleeding) 09/19/2018   OBESITY, NOS 07/26/2006   Past Medical History:  Diagnosis Date   Endometrial polyp 03/17/2019   Hypertension    Morbid obesity (HCC)    Pre-diabetes    per patient   Family History  Problem Relation Age of Onset   Hypertension Mother    Diabetes Mother    Diabetes Father    Hypertension Sister    Hypertension Sister    Past Surgical History:  Procedure Laterality Date   HYSTEROSCOPY WITH D & C N/A 04/08/2019   Procedure: DILATATION AND CURETTAGE /HYSTEROSCOPY WITH ENDOMETRIAL POLYPECTOMY;  Surgeon: Hermina Staggers, MD;  Location: [facility] OR;  Service: Gynecology;   Laterality: N/A;   NO PAST SURGERIES     Social History   Occupational History   Not on file  Tobacco Use   Smoking status: Never   Smokeless tobacco: Never  Vaping Use   Vaping status: Never Used  Substance and Sexual Activity   Alcohol use: Yes    Comment: occasionally   Drug use: No   Sexual activity: Not Currently    Birth control/protection: None

## 2023-07-10 ENCOUNTER — Ambulatory Visit: Payer: Medicaid Other

## 2023-07-10 DIAGNOSIS — M6281 Muscle weakness (generalized): Secondary | ICD-10-CM | POA: Diagnosis not present

## 2023-07-10 DIAGNOSIS — M5442 Lumbago with sciatica, left side: Secondary | ICD-10-CM | POA: Diagnosis not present

## 2023-07-11 ENCOUNTER — Ambulatory Visit: Payer: Medicaid Other | Admitting: Family Medicine
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
## 2023-07-17 ENCOUNTER — Ambulatory Visit: Payer: Medicaid Other

## 2023-07-17 ENCOUNTER — Telehealth: Payer: Self-pay | Admitting: Physical Medicine and Rehabilitation

## 2023-07-17 DIAGNOSIS — M5442 Lumbago with sciatica, left side: Secondary | ICD-10-CM | POA: Diagnosis not present

## 2023-07-17 DIAGNOSIS — M6281 Muscle weakness (generalized): Secondary | ICD-10-CM
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
## 2023-07-19 NOTE — Therapy (Signed)
OUTPATIENT PHYSICAL THERAPY THORACOLUMBAR TREATMENT/Re-Auth/Re-Cert   Patient Name: Dana Allison MRN: [id number] DOB:[birth year], 38 y.o., female Today's Date: 07/20/2023  END OF SESSION:  PT End of Session - 07/20/23 0942     Visit Number 8    Number of Visits 19    Date for PT Re-Evaluation 09/07/23    Authorization Type [place] MEDICAID HEALTHY BLUE    Authorization Time Period Approved 6 visits 07/18/23-09/15/23    Authorization - Visit Number 1    Authorization - Number of Visits 6    PT Start Time 0935    PT Stop Time 1030    PT Time Calculation (min) 55 min    Activity Tolerance Patient tolerated treatment well    Behavior During Therapy [facility] for tasks assessed/performed                   Past Medical History:  Diagnosis Date   Endometrial polyp 03/17/2019   Hypertension    Morbid obesity (HCC)    Pre-diabetes    per patient   Past Surgical History:  Procedure Laterality Date   HYSTEROSCOPY WITH D & C N/A 04/08/2019   Procedure: DILATATION AND CURETTAGE /HYSTEROSCOPY WITH ENDOMETRIAL POLYPECTOMY;  Surgeon: Hermina Staggers, MD;  Location: [facility] OR;  Service: Gynecology;  Laterality: N/A;   NO PAST SURGERIES     Patient Active Problem List   Diagnosis Date Noted   Low back pain 04/05/2023   BMI 40.0-44.9, adult (HCC) 09/21/2021   Amenorrhea, secondary 09/21/2021   Malpositioned intrauterine device (IUD) 03/28/2021   Severe dysplasia of cervix (CIN III) 12/20/2020   LGSIL on Pap smear of cervix 11/01/2020   Morbid obesity (HCC)    Hypertension    Pre-diabetes    DUB (dysfunctional uterine bleeding) 09/19/2018   OBESITY, NOS 07/26/2006    PCP: Hoy Register, MD   REFERRING PROVIDER: Juanda Chance, NP  REFERRING DIAG:  M54.16 (ICD-10-CM) - Lumbar radiculopathy  M51.16 (ICD-10-CM) - Intervertebral disc disorders with radiculopathy, lumbar region  M54.42,G89.29 (ICD-10-CM) - Chronic left-sided low back pain with left-sided sciatica    Rationale  for Evaluation and Treatment: Rehabilitation  THERAPY DIAG:  Left-sided low back pain with left-sided sciatica, unspecified chronicity  ONSET DATE: Approx 2 months ago  SUBJECTIVE:                                                                                                                                                                                           SUBJECTIVE STATEMENT: Pt reports pain medication and muscle relaxers help, but the benefit will wear off. Pt notes she is looking into obtaining a heating  pad.  Eval: Pt reports she was experiencing significant low back pain and L LE pain that started insidiously about 2 months ago. Since then, the pain has improve a great deal with pt est. 95% improvement. A muscle relaxer and prednisone, and rest have been helpful with relieving the pain.   PERTINENT HISTORY:  High BMI  PAIN:  Are you having pain? Yes: NPRS scale: 07/10 Pain location: L low back and L LE Pain description: constant ache Aggravating factors: Many different movements Relieving factors: Medications, rest  PRECAUTIONS: None  RED FLAGS: None   WEIGHT BEARING RESTRICTIONS: No  FALLS:  Has patient fallen in last 6 months? 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  DIAGNOSTIC FINDINGS:  Lumbar spine MRI 04/23/23 IMPRESSION: 1. Broad-based Left paracentral disc protrusion at L4-L5 with annular fissure. Up to moderate associated left foraminal and lateral lateral recess stenosis without spinal stenosis. Query Left L4 and/or L5 radiculitis (respectively). 2. No other significant lumbar disc degeneration. Multilevel lumbar mild facet, posterior element degeneration. No  other onvincing neural impingement.   PATIENT SURVEYS:  FOTO: Perceived function   63%, predicted   72%   COGNITION: Overall cognitive status: Within functional limits for tasks assessed     SENSATION: WFL  MUSCLE LENGTH: Hamstrings: Right 50 deg; Left 40 deg Thomas test: Right WNLs deg; Left WNLs deg  POSTURE: forward head and out toeing  PALPATION: Currently not TTP 07/17/23: Pt is TTP to the lumbat paraspinals L>R  LUMBAR ROM:   AROM eval  Flexion Min limited, tightness low back  Extension Min limited, no p  Right lateral flexion Full, L LBP  Left lateral flexion Full, no p  Right rotation Full, no p  Left rotation Full, no p   (Blank rows = not tested) 07/17/23: All movements are markedly and provoke concordant pain  LOWER EXTREMITY MMT:     Grossly WNLs  Right eval Left eval Rt 07/10/23 LT 07/10/23  Hip flexion 4 4 4+ 4+  Hip extension 4 4 4+ 4+  Hip abduction 4 4 4+ 4+  Hip adduction      Hip internal rotation      Hip external rotation 4 4 4+ 4+  Knee flexion 5 5    Knee extension 5 4+    Ankle dorsiflexion 5 4+    Ankle plantarflexion      Ankle inversion      Ankle eversion       (Blank rows = not tested)  LOWER EXTREMITY ROM:     Grossly WNLs MMT Right eval Left eval  Hip flexion    Hip extension    Hip abduction    Hip adduction    Hip internal rotation    Hip external rotation    Knee flexion    Knee extension    Ankle dorsiflexion    Ankle plantarflexion    Ankle inversion    Ankle eversion     (Blank rows = not tested)  LUMBAR SPECIAL TESTS:  Straight leg raise test: L tighter and mildly more symptomatic than R and Slump test: L tighter and mildly more symptomatic than R  07/17/23: Positive SLR and slump tests L>R  FUNCTIONAL TESTS:  Static bridging 30"  GAIT: Distance  walked: 200' Assistive device utilized: None Level of assistance: Complete Independence Comments: Out toeing bilat  TREATMENT DATE:  [facility] Adult PT  Treatment:                                                DATE: 07/20/23 Therapeutic Exercise: Seated nerve glide with LAQ x10 Prone press ups x10 Prone alt leg lifts x15 each LTR x10 TA bracing TA set c marching x10  Supine Bridge Banded GTB 10 reps - 3 hold Hooklying Clamshell 10 reps - 5 hold GTB Modalities: Pre Mod Estim c Moist heat x15 mins to low back  [facility] Adult PT Treatment:                                                DATE: 07/17/23 Re-eval Therapeutic Exercise: LTR x10 Supine marching x10 Seated nerve glide with LAQ x10 Supine arm elevation/pullovers x10  Manual Therapy: STM to the low back bilaterally Modalities: Moist heat x15 mins to low back Self Care: Sleeping positions and use of pillows for support and comfort Use of heating pads or cold pack for pain management  [facility] Adult PT Treatment:                                                DATE: 07/10/23 Therapeutic Exercise: Nustep L5 x 5 minutes  Supine Sciatic Nerve Glide @ sets - 10 reps TA set c marching 2x10 BluTB Supine Bridge Banded BluTB 10 reps - 5 hold Hooklying Clamshell 10 reps - 5 hold BluTB Prone press ups x10 Prone alt leg lifts x15 each MMT Therapeutic Activity: Hinged hip lifting 2x15 15# Shoulder and OH bilat lifting x10 10# each lift  [facility] Adult PT Treatment:                                                DATE: 07/02/23 Therapeutic Exercise: Nustep L5 x 5 minutes  Hooklying Single Knee to Chest Stretch 1 reps - 30 hold Supine Sciatic Nerve Glide 1 sets - 10 reps TA set c marching 2x10 BluTB Supine Bridge Banded BluTB 10 reps - 5 hold Hooklying Clamshell 10 reps - 5 hold BluTB AB brace with SLR x 8 each  LTR Prone press ups x10 Prone alt leg lifts x15 each  Therapeutic Activity: Hinged hip lifting 2x10 15# Partial lunges 2 x10 10# OH reaches x15 5# (5# KB OH press)  [facility] Adult PT Treatment:                                                DATE: 06/26/23 Therapeutic  Exercise: Nustep 5 mins L5 UE/LE while taking subjective Hooklying Single Knee to Chest Stretch 1 reps - 30 hold Supine Sciatic Nerve Glide 1 sets - 10 reps TA set c marching 2x10 BluTB Supine Bridge  Banded BluTB 10 reps - 5 hold Hooklying Clamshell 10 reps - 5 hold BluTB Prone press ups x10 Prone alt leg lifts x15 each Hinged hip lifting 2x10 20# Partial lunges 2 x10 10# OH reaches x15 5# RDLs lifts c x 10 5# Self Care: Proper body mechanics for lifting and household activities  PATIENT EDUCATION:  Education details: Eval findings, POC, HEP Person educated: Patient Education method: Explanation, Demonstration, Tactile cues, Verbal cues, and Handouts Education comprehension: verbalized understanding, returned demonstration, verbal cues required, and tactile cues required  HOME EXERCISE PROGRAM: Access Code: [email or web address] URL: [email or web address] Date: 06/19/2023 Prepared by: Joellyn Rued  Exercises - Hooklying Single Knee to Chest Stretch  - 2 x daily - 7 x weekly - 1 sets - 3 reps - 20 hold - Supine Sciatic Nerve Glide  - 2 x daily - 7 x weekly - 3 sets - 10 reps - Supine Bridge  - 2 x daily - 7 x weekly - 1 sets - 15 reps - 5 hold - Hooklying Clamshell with Resistance  - 2 x daily - 7 x weekly - 1 sets - 15 reps - 5 hold - Supine Transversus Abdominis Bracing - Hands on Ground  - 1 x daily - 7 x weekly - 1 sets - 15 reps - Supine March with Resistance Band  - 1 x daily - 7 x weekly - 2 sets - 15 reps - Beginner Prone Single Leg Raise  - 1 x daily - 7 x weekly - 1 sets - 15 reps - 2 hold - Prone Press Up  - 1 x daily - 7 x weekly - 1 sets - 10 reps - Sit to Stand Without Arm Support  - 1 x daily - 7 x weekly - 2 sets - 10 reps - Anti-Rotation Lateral Stepping with Press  - 1 x daily - 7 x weekly - 1 sets - 10 reps  ASSESSMENT:  CLINICAL IMPRESSION: PT was completed for gentle neural glides and for lumbopelvic flexibility and strengthening. Pt completed   Erex as  tolerated. Pre Mod Estim was then provided c moist heat for for pain modulation. Pt reported 4/10 low back pain at end of session. Pt is to completed her HEP as tolerated as tolerated, and she is looking to obtain a heating pad. Pt will continue to benefit from skilled PT to address impairments for improved back function with minimized pain.   Eval: Patient is a 38 y.o. female who was seen today for physical therapy evaluation and treatment for  M54.16 (ICD-10-CM) - Lumbar radiculopathy  M51.16 (ICD-10-CM) - Intervertebral disc disorders with radiculopathy, lumbar region  M54.42,G89.29 (ICD-10-CM) - Chronic left-sided low back pain with left-sided sciatica  Pt presents with L low back and L LE pain which has been gradually improving with the use of a muscle relaxer and steroid. Pt demonstrates decreased L quad and ankle DF strength indicative L4, L5 nerve root involvement. Additionally, pt has decreased hip and core strength. Pt was started on an initial HEP. Pt will benefit from skilled PT 1w8 to address impairments to optimize back function.   OBJECTIVE IMPAIRMENTS: decreased activity tolerance, decreased ROM, decreased strength, obesity, and pain.   ACTIVITY LIMITATIONS: carrying, lifting, bending, sitting, standing, and locomotion level  PARTICIPATION LIMITATIONS: meal prep, cleaning, laundry, and occupation  PERSONAL FACTORS: Fitness, Past/current experiences, Time since onset of injury/illness/exacerbation, and 1 comorbidity: high BMI  are also affecting patient's functional outcome.   REHAB POTENTIAL: Good  CLINICAL DECISION MAKING: Evolving/moderate  complexity  EVALUATION COMPLEXITY: Moderate   GOALS:  SHORT TERM GOALS: Target date: 06/29/23  Pt will be Ind in an initial HEP  Baseline: started Goal status: MET   LONG TERM GOALS: Target date: 09/07/23  Pt will be Ind in a final HEP to maintain achieved LOF  Baseline: started Goal status: INITIAL  2.  Increased bilat hip  strength to 4+ and L ankle DF and knee ext to 5/5 for improved functional mobility Baseline: see flow sheets Goal status: MET  3.  Pt will be able to complete a static bridge for 90 sec and a static plank from knees for 30" to demonstrate improved core strength Baseline: bridge 30" Goal status: INITIAL  4.  Increase L SLR ROM to equal the R to improve both the muscular and neural flexibility Baseline: 40d 07/10/23: 50d Goal status: MET  5.  Pt's FOTO score will improved to the predicted value of 63% as indication of improved function  Baseline: 74% Goal status: DEFERRED  PLAN:  PT FREQUENCY: 2x/week  PT DURATION: 6 weeks  PLANNED INTERVENTIONS: 97164- PT Re-evaluation, 97110-Therapeutic exercises, 97530- Therapeutic activity, 97112- Neuromuscular re-education, 97535- Self Care, [id number]- Manual therapy, [id number]- Aquatic Therapy, [id number]- Electrical stimulation (manual), Patient/Family education, Taping, Dry Needling, Spinal mobilization, Cryotherapy, and Moist heat.  PLAN FOR NEXT SESSION: Review FOTO; assess response to HEP; body mechanics; progress therex as indicated; use of modalities, manual therapy, and TPDN as indicated.  Diane Mochizuki MS, PT 07/20/23 1:05 PM

## 2023-07-20 ENCOUNTER — Ambulatory Visit: Payer: Medicaid Other

## 2023-07-20 DIAGNOSIS — M5442 Lumbago with sciatica, left side: Secondary | ICD-10-CM

## 2023-07-20 DIAGNOSIS — M6281 Muscle weakness (generalized): Secondary | ICD-10-CM | POA: Diagnosis not present

## 2023-07-24 NOTE — Therapy (Incomplete)
OUTPATIENT PHYSICAL THERAPY THORACOLUMBAR TREATMENT/Re-Auth/Re-Cert   Patient Name: Dana Allison MRN: [id number] DOB:1985/06/02, 38 y.o., female Today's Date: 07/24/2023  END OF SESSION:          Past Medical History:  Diagnosis Date   Endometrial polyp 03/17/2019   Hypertension    Morbid obesity (HCC)    Pre-diabetes    per patient   Past Surgical History:  Procedure Laterality Date   HYSTEROSCOPY WITH D & C N/A 04/08/2019   Procedure: DILATATION AND CURETTAGE /HYSTEROSCOPY WITH ENDOMETRIAL POLYPECTOMY;  Surgeon: Hermina Staggers, MD;  Location: [facility] OR;  Service: Gynecology;  Laterality: N/A;   NO PAST SURGERIES     Patient Active Problem List   Diagnosis Date Noted   Low back pain 04/05/2023   BMI 40.0-44.9, adult (HCC) 09/21/2021   Amenorrhea, secondary 09/21/2021   Malpositioned intrauterine device (IUD) 03/28/2021   Severe dysplasia of cervix (CIN III) 12/20/2020   LGSIL on Pap smear of cervix 11/01/2020   Morbid obesity (HCC)    Hypertension    Pre-diabetes    DUB (dysfunctional uterine bleeding) 09/19/2018   OBESITY, NOS 07/26/2006    PCP: Hoy Register, MD   REFERRING PROVIDER: Juanda Chance, NP  REFERRING DIAG:  M54.16 (ICD-10-CM) - Lumbar radiculopathy  M51.16 (ICD-10-CM) - Intervertebral disc disorders with radiculopathy, lumbar region  M54.42,G89.29 (ICD-10-CM) - Chronic left-sided low back pain with left-sided sciatica    Rationale for Evaluation and Treatment: Rehabilitation  THERAPY DIAG:  No diagnosis found.  ONSET DATE: Approx 2 months ago  SUBJECTIVE:                                                                                                                                                                                           SUBJECTIVE STATEMENT: Pt reports pain medication and muscle relaxers help, but the benefit will wear off. Pt notes she is looking into obtaining a heating pad.  Eval: Pt reports she was  experiencing significant low back pain and L LE pain that started insidiously about 2 months ago. Since then, the pain has improve a great deal with pt est. 95% improvement. A muscle relaxer and prednisone, and rest have been helpful with relieving the pain.   PERTINENT HISTORY:  High BMI  PAIN:  Are you having pain? Yes: NPRS scale: 07/10 Pain location: L low back and L LE Pain description: constant ache Aggravating factors: Many different movements Relieving factors: Medications, rest  PRECAUTIONS: None  RED FLAGS: None   WEIGHT BEARING RESTRICTIONS: No  FALLS:  Has patient fallen in last 6 months? No  LIVING ENVIRONMENT: Lives with: lives alone Lives in:  House/apartment No issue with accessing home  OCCUPATION: [employer], sales  PLOF: Independent  PATIENT GOALS: To get better  NEXT MD VISIT: February  OBJECTIVE:  Note: Objective measures were completed at Evaluation unless otherwise noted.  DIAGNOSTIC FINDINGS:  Lumbar spine MRI 04/23/23 IMPRESSION: 1. Broad-based Left paracentral disc protrusion at L4-L5 with annular fissure. Up to moderate associated left foraminal and lateral lateral recess stenosis without spinal stenosis. Query Left L4 and/or L5 radiculitis (respectively). 2. No other significant lumbar disc degeneration. Multilevel lumbar mild facet, posterior element degeneration. No other onvincing neural impingement.   PATIENT SURVEYS:  FOTO: Perceived function   63%, predicted   72%   COGNITION: Overall cognitive status: Within functional limits for tasks assessed     SENSATION: WFL  MUSCLE LENGTH: Hamstrings: Right 50 deg; Left 40 deg Thomas test: Right WNLs deg; Left WNLs deg  POSTURE: forward head and out toeing  PALPATION: Currently not TTP 07/17/23: Pt is TTP to the lumbat paraspinals L>R  LUMBAR ROM:   AROM eval  Flexion Min limited, tightness low back  Extension Min limited, no p  Right lateral flexion  Full, L LBP  Left lateral flexion Full, no p  Right rotation Full, no p  Left rotation Full, no p   (Blank rows = not tested) 07/17/23: All movements are markedly and provoke concordant pain  LOWER EXTREMITY MMT:     Grossly WNLs  Right eval Left eval Rt 07/10/23 LT 07/10/23  Hip flexion 4 4 4+ 4+  Hip extension 4 4 4+ 4+  Hip abduction 4 4 4+ 4+  Hip adduction      Hip internal rotation      Hip external rotation 4 4 4+ 4+  Knee flexion 5 5    Knee extension 5 4+    Ankle dorsiflexion 5 4+    Ankle plantarflexion      Ankle inversion      Ankle eversion       (Blank rows = not tested)  LOWER EXTREMITY ROM:     Grossly WNLs MMT Right eval Left eval  Hip flexion    Hip extension    Hip abduction    Hip adduction    Hip internal rotation    Hip external rotation    Knee flexion    Knee extension    Ankle dorsiflexion    Ankle plantarflexion    Ankle inversion    Ankle eversion     (Blank rows = not tested)  LUMBAR SPECIAL TESTS:  Straight leg raise test: L tighter and mildly more symptomatic than R and Slump test: L tighter and mildly more symptomatic than R  07/17/23: Positive SLR and slump tests L>R  FUNCTIONAL TESTS:  Static bridging 30"  GAIT: Distance walked: 200' Assistive device utilized: None Level of assistance: Complete Independence Comments: Out toeing bilat  TREATMENT DATE:  [facility] Adult PT Treatment:                                                DATE: 07/25/23 Therapeutic Exercise: Seated nerve glide with LAQ x10 Prone press ups x10 Prone alt leg lifts x15 each LTR x10 TA bracing TA set c marching x10  Supine Bridge Banded GTB 10 reps - 3 hold Hooklying Clamshell 10 reps - 5 hold GTB Modalities: Pre Mod Estim c Moist  heat x15 mins to low back Therapeutic Exercise: *** Manual Therapy: *** Neuromuscular re-ed: *** Therapeutic Activity: *** Modalities: *** Self Care: ***  [name] Adult PT Treatment:                                                 DATE: 07/20/23 Therapeutic Exercise: Seated nerve glide with LAQ x10 Prone press ups x10 Prone alt leg lifts x15 each LTR x10 TA bracing TA set c marching x10  Supine Bridge Banded GTB 10 reps - 3 hold Hooklying Clamshell 10 reps - 5 hold GTB Modalities: Pre Mod Estim c Moist heat x15 mins to low back  [facility] Adult PT Treatment:                                                DATE: 07/17/23 Re-eval Therapeutic Exercise: LTR x10 Supine marching x10 Seated nerve glide with LAQ x10 Supine arm elevation/pullovers x10  Manual Therapy: STM to the low back bilaterally Modalities: Moist heat x15 mins to low back Self Care: Sleeping positions and use of pillows for support and comfort Use of heating pads or cold pack for pain management  [facility] Adult PT Treatment:                                                DATE: 07/10/23 Therapeutic Exercise: Nustep L5 x 5 minutes  Supine Sciatic Nerve Glide @ sets - 10 reps TA set c marching 2x10 BluTB Supine Bridge Banded BluTB 10 reps - 5 hold Hooklying Clamshell 10 reps - 5 hold BluTB Prone press ups x10 Prone alt leg lifts x15 each MMT Therapeutic Activity: Hinged hip lifting 2x15 15# Shoulder and OH bilat lifting x10 10# each lift   PATIENT EDUCATION:  Education details: Eval findings, POC, HEP Person educated: Patient Education method: Explanation, Demonstration, Tactile cues, Verbal cues, and Handouts Education comprehension: verbalized understanding, returned demonstration, verbal cues required, and tactile cues required  HOME EXERCISE PROGRAM: Access Code: [email or web address] URL: [email or web address] Date: 06/19/2023 Prepared by: Joellyn Rued  Exercises - Hooklying Single Knee to Chest Stretch  - 2 x daily - 7 x weekly - 1 sets - 3 reps - 20 hold - Supine Sciatic Nerve Glide  - 2 x daily - 7 x weekly - 3 sets - 10 reps - Supine Bridge  - 2 x daily - 7 x weekly - 1 sets - 15 reps - 5 hold - Hooklying Clamshell  with Resistance  - 2 x daily - 7 x weekly - 1 sets - 15 reps - 5 hold - Supine Transversus Abdominis Bracing - Hands on Ground  - 1 x daily - 7 x weekly - 1 sets - 15 reps - Supine March with Resistance Band  - 1 x daily - 7 x weekly - 2 sets - 15 reps - Beginner Prone Single Leg Raise  - 1 x daily - 7 x weekly - 1 sets - 15 reps - 2 hold - Prone Press Up  - 1 x daily - 7 x weekly - 1 sets - 10 reps -  Sit to Stand Without Arm Support  - 1 x daily - 7 x weekly - 2 sets - 10 reps - Anti-Rotation Lateral Stepping with Press  - 1 x daily - 7 x weekly - 1 sets - 10 reps  ASSESSMENT:  CLINICAL IMPRESSION: PT was completed for gentle neural glides and for lumbopelvic flexibility and strengthening. Pt completed   Erex as tolerated. Pre Mod Estim was then provided c moist heat for for pain modulation. Pt reported 4/10 low back pain at end of session. Pt is to completed her HEP as tolerated as tolerated, and she is looking to obtain a heating pad. Pt will continue to benefit from skilled PT to address impairments for improved back function with minimized pain.   Eval: Patient is a 38 y.o. female who was seen today for physical therapy evaluation and treatment for  M54.16 (ICD-10-CM) - Lumbar radiculopathy  M51.16 (ICD-10-CM) - Intervertebral disc disorders with radiculopathy, lumbar region  M54.42,G89.29 (ICD-10-CM) - Chronic left-sided low back pain with left-sided sciatica  Pt presents with L low back and L LE pain which has been gradually improving with the use of a muscle relaxer and steroid. Pt demonstrates decreased L quad and ankle DF strength indicative L4, L5 nerve root involvement. Additionally, pt has decreased hip and core strength. Pt was started on an initial HEP. Pt will benefit from skilled PT 1w8 to address impairments to optimize back function.   OBJECTIVE IMPAIRMENTS: decreased activity tolerance, decreased ROM, decreased strength, obesity, and pain.   ACTIVITY LIMITATIONS: carrying,  lifting, bending, sitting, standing, and locomotion level  PARTICIPATION LIMITATIONS: meal prep, cleaning, laundry, and occupation  PERSONAL FACTORS: Fitness, Past/current experiences, Time since onset of injury/illness/exacerbation, and 1 comorbidity: high BMI  are also affecting patient's functional outcome.   REHAB POTENTIAL: Good  CLINICAL DECISION MAKING: Evolving/moderate complexity  EVALUATION COMPLEXITY: Moderate   GOALS:  SHORT TERM GOALS: Target date: 06/29/23  Pt will be Ind in an initial HEP  Baseline: started Goal status: MET   LONG TERM GOALS: Target date: 09/07/23  Pt will be Ind in a final HEP to maintain achieved LOF  Baseline: started Goal status: INITIAL  2.  Increased bilat hip strength to 4+ and L ankle DF and knee ext to 5/5 for improved functional mobility Baseline: see flow sheets Goal status: MET  3.  Pt will be able to complete a static bridge for 90 sec and a static plank from knees for 30" to demonstrate improved core strength Baseline: bridge 30" Goal status: INITIAL  4.  Increase L SLR ROM to equal the R to improve both the muscular and neural flexibility Baseline: 40d 07/10/23: 50d Goal status: MET  5.  Pt's FOTO score will improved to the predicted value of 63% as indication of improved function  Baseline: 74% Goal status: DEFERRED  PLAN:  PT FREQUENCY: 2x/week  PT DURATION: 6 weeks  PLANNED INTERVENTIONS: 97164- PT Re-evaluation, 97110-Therapeutic exercises, 97530- Therapeutic activity, 97112- Neuromuscular re-education, 97535- Self Care, [id number]- Manual therapy, [id number]- Aquatic Therapy, [id number]- Electrical stimulation (manual), Patient/Family education, Taping, Dry Needling, Spinal mobilization, Cryotherapy, and Moist heat.  PLAN FOR NEXT SESSION: Review FOTO; assess response to HEP; body mechanics; progress therex as indicated; use of modalities, manual therapy, and TPDN as indicated.  Shacola Schussler MS, PT 07/24/23 6:11 PM

## 2023-07-25 ENCOUNTER — Ambulatory Visit: Payer: Medicaid Other

## 2023-07-25 ENCOUNTER — Ambulatory Visit: Payer: Medicaid Other | Admitting: Physical Medicine and Rehabilitation

## 2023-07-30 NOTE — Therapy (Signed)
 OUTPATIENT PHYSICAL THERAPY THORACOLUMBAR TREATMENT/Re-Auth/Re-Cert   Patient Name: Dana Allison MRN: [id number] DOB:20-Feb-1986, 38 y.o., female Today's Date: 07/30/2023  END OF SESSION:          Past Medical History:  Diagnosis Date   Endometrial polyp 03/17/2019   Hypertension    Morbid obesity (HCC)    Pre-diabetes    per patient   Past Surgical History:  Procedure Laterality Date   HYSTEROSCOPY WITH D & C N/A 04/08/2019   Procedure: DILATATION AND CURETTAGE /HYSTEROSCOPY WITH ENDOMETRIAL POLYPECTOMY;  Surgeon: Hermina Staggers, MD;  Location: [facility] OR;  Service: Gynecology;  Laterality: N/A;   NO PAST SURGERIES     Patient Active Problem List   Diagnosis Date Noted   Low back pain 04/05/2023   BMI 40.0-44.9, adult (HCC) 09/21/2021   Amenorrhea, secondary 09/21/2021   Malpositioned intrauterine device (IUD) 03/28/2021   Severe dysplasia of cervix (CIN III) 12/20/2020   LGSIL on Pap smear of cervix 11/01/2020   Morbid obesity (HCC)    Hypertension    Pre-diabetes    DUB (dysfunctional uterine bleeding) 09/19/2018   OBESITY, NOS 07/26/2006    PCP: Hoy Register, MD   REFERRING PROVIDER: Juanda Chance, NP  REFERRING DIAG:  M54.16 (ICD-10-CM) - Lumbar radiculopathy  M51.16 (ICD-10-CM) - Intervertebral disc disorders with radiculopathy, lumbar region  M54.42,G89.29 (ICD-10-CM) - Chronic left-sided low back pain with left-sided sciatica    Rationale for Evaluation and Treatment: Rehabilitation  THERAPY DIAG:  No diagnosis found.  ONSET DATE: Approx 2 months ago  SUBJECTIVE:                                                                                                                                                                                           SUBJECTIVE STATEMENT: Pt reports pain medication and muscle relaxers help, but the benefit will wear off. Pt notes she is looking into obtaining a heating pad.  Eval: Pt reports she was  experiencing significant low back pain and L LE pain that started insidiously about 2 months ago. Since then, the pain has improve a great deal with pt est. 95% improvement. A muscle relaxer and prednisone, and rest have been helpful with relieving the pain.   PERTINENT HISTORY:  High BMI  PAIN:  Are you having pain? Yes: NPRS scale: 07/10 Pain location: L low back and L LE Pain description: constant ache Aggravating factors: Many different movements Relieving factors: Medications, rest  PRECAUTIONS: None  RED FLAGS: None   WEIGHT BEARING RESTRICTIONS: No  FALLS:  Has patient fallen in last 6 months? No  LIVING ENVIRONMENT: Lives with: lives alone Lives in:  House/apartment No issue with accessing home  OCCUPATION: [employer], sales  PLOF: Independent  PATIENT GOALS: To get better  NEXT MD VISIT: February  OBJECTIVE:  Note: Objective measures were completed at Evaluation unless otherwise noted.  DIAGNOSTIC FINDINGS:  Lumbar spine MRI 04/23/23 IMPRESSION: 1. Broad-based Left paracentral disc protrusion at L4-L5 with annular fissure. Up to moderate associated left foraminal and lateral lateral recess stenosis without spinal stenosis. Query Left L4 and/or L5 radiculitis (respectively). 2. No other significant lumbar disc degeneration. Multilevel lumbar mild facet, posterior element degeneration. No other onvincing neural impingement.   PATIENT SURVEYS:  FOTO: Perceived function   63%, predicted   72%   COGNITION: Overall cognitive status: Within functional limits for tasks assessed     SENSATION: WFL  MUSCLE LENGTH: Hamstrings: Right 50 deg; Left 40 deg Thomas test: Right WNLs deg; Left WNLs deg  POSTURE: forward head and out toeing  PALPATION: Currently not TTP 07/17/23: Pt is TTP to the lumbat paraspinals L>R  LUMBAR ROM:   AROM eval  Flexion Min limited, tightness low back  Extension Min limited, no p  Right lateral flexion  Full, L LBP  Left lateral flexion Full, no p  Right rotation Full, no p  Left rotation Full, no p   (Blank rows = not tested) 07/17/23: All movements are markedly and provoke concordant pain  LOWER EXTREMITY MMT:     Grossly WNLs  Right eval Left eval Rt 07/10/23 LT 07/10/23  Hip flexion 4 4 4+ 4+  Hip extension 4 4 4+ 4+  Hip abduction 4 4 4+ 4+  Hip adduction      Hip internal rotation      Hip external rotation 4 4 4+ 4+  Knee flexion 5 5    Knee extension 5 4+    Ankle dorsiflexion 5 4+    Ankle plantarflexion      Ankle inversion      Ankle eversion       (Blank rows = not tested)  LOWER EXTREMITY ROM:     Grossly WNLs MMT Right eval Left eval  Hip flexion    Hip extension    Hip abduction    Hip adduction    Hip internal rotation    Hip external rotation    Knee flexion    Knee extension    Ankle dorsiflexion    Ankle plantarflexion    Ankle inversion    Ankle eversion     (Blank rows = not tested)  LUMBAR SPECIAL TESTS:  Straight leg raise test: L tighter and mildly more symptomatic than R and Slump test: L tighter and mildly more symptomatic than R  07/17/23: Positive SLR and slump tests L>R  FUNCTIONAL TESTS:  Static bridging 30"  GAIT: Distance walked: 200' Assistive device utilized: None Level of assistance: Complete Independence Comments: Out toeing bilat  TREATMENT DATE:  [facility] Adult PT Treatment:                                                DATE: 08/01/23 Therapeutic Exercise: Seated nerve glide with LAQ x10 Prone press ups x10 Prone alt leg lifts x15 each LTR x10 TA bracing TA set c marching x10  Supine Bridge Banded GTB 10 reps - 3 hold Hooklying Clamshell 10 reps - 5 hold GTB Modalities: Pre Mod Estim c Moist  heat x15 mins to low back Therapeutic Exercise: *** Manual Therapy: *** Neuromuscular re-ed: *** Therapeutic Activity: *** Modalities: *** Self Care: ***  [name] Adult PT Treatment:                                                 DATE: 07/20/23 Therapeutic Exercise: Seated nerve glide with LAQ x10 Prone press ups x10 Prone alt leg lifts x15 each LTR x10 TA bracing TA set c marching x10  Supine Bridge Banded GTB 10 reps - 3 hold Hooklying Clamshell 10 reps - 5 hold GTB Modalities: Pre Mod Estim c Moist heat x15 mins to low back  [facility] Adult PT Treatment:                                                DATE: 07/17/23 Re-eval Therapeutic Exercise: LTR x10 Supine marching x10 Seated nerve glide with LAQ x10 Supine arm elevation/pullovers x10  Manual Therapy: STM to the low back bilaterally Modalities: Moist heat x15 mins to low back Self Care: Sleeping positions and use of pillows for support and comfort Use of heating pads or cold pack for pain management  [facility] Adult PT Treatment:                                                DATE: 07/10/23 Therapeutic Exercise: Nustep L5 x 5 minutes  Supine Sciatic Nerve Glide @ sets - 10 reps TA set c marching 2x10 BluTB Supine Bridge Banded BluTB 10 reps - 5 hold Hooklying Clamshell 10 reps - 5 hold BluTB Prone press ups x10 Prone alt leg lifts x15 each MMT Therapeutic Activity: Hinged hip lifting 2x15 15# Shoulder and OH bilat lifting x10 10# each lift   PATIENT EDUCATION:  Education details: Eval findings, POC, HEP Person educated: Patient Education method: Explanation, Demonstration, Tactile cues, Verbal cues, and Handouts Education comprehension: verbalized understanding, returned demonstration, verbal cues required, and tactile cues required  HOME EXERCISE PROGRAM: Access Code: [email or web address] URL: [email or web address] Date: 06/19/2023 Prepared by: Joellyn Rued  Exercises - Hooklying Single Knee to Chest Stretch  - 2 x daily - 7 x weekly - 1 sets - 3 reps - 20 hold - Supine Sciatic Nerve Glide  - 2 x daily - 7 x weekly - 3 sets - 10 reps - Supine Bridge  - 2 x daily - 7 x weekly - 1 sets - 15 reps - 5 hold - Hooklying Clamshell  with Resistance  - 2 x daily - 7 x weekly - 1 sets - 15 reps - 5 hold - Supine Transversus Abdominis Bracing - Hands on Ground  - 1 x daily - 7 x weekly - 1 sets - 15 reps - Supine March with Resistance Band  - 1 x daily - 7 x weekly - 2 sets - 15 reps - Beginner Prone Single Leg Raise  - 1 x daily - 7 x weekly - 1 sets - 15 reps - 2 hold - Prone Press Up  - 1 x daily - 7 x weekly - 1 sets - 10 reps -  Sit to Stand Without Arm Support  - 1 x daily - 7 x weekly - 2 sets - 10 reps - Anti-Rotation Lateral Stepping with Press  - 1 x daily - 7 x weekly - 1 sets - 10 reps  ASSESSMENT:  CLINICAL IMPRESSION: PT was completed for gentle neural glides and for lumbopelvic flexibility and strengthening. Pt completed   Erex as tolerated. Pre Mod Estim was then provided c moist heat for for pain modulation. Pt reported 4/10 low back pain at end of session. Pt is to completed her HEP as tolerated as tolerated, and she is looking to obtain a heating pad. Pt will continue to benefit from skilled PT to address impairments for improved back function with minimized pain.   Eval: Patient is a 38 y.o. female who was seen today for physical therapy evaluation and treatment for  M54.16 (ICD-10-CM) - Lumbar radiculopathy  M51.16 (ICD-10-CM) - Intervertebral disc disorders with radiculopathy, lumbar region  M54.42,G89.29 (ICD-10-CM) - Chronic left-sided low back pain with left-sided sciatica  Pt presents with L low back and L LE pain which has been gradually improving with the use of a muscle relaxer and steroid. Pt demonstrates decreased L quad and ankle DF strength indicative L4, L5 nerve root involvement. Additionally, pt has decreased hip and core strength. Pt was started on an initial HEP. Pt will benefit from skilled PT 1w8 to address impairments to optimize back function.   OBJECTIVE IMPAIRMENTS: decreased activity tolerance, decreased ROM, decreased strength, obesity, and pain.   ACTIVITY LIMITATIONS: carrying,  lifting, bending, sitting, standing, and locomotion level  PARTICIPATION LIMITATIONS: meal prep, cleaning, laundry, and occupation  PERSONAL FACTORS: Fitness, Past/current experiences, Time since onset of injury/illness/exacerbation, and 1 comorbidity: high BMI  are also affecting patient's functional outcome.   REHAB POTENTIAL: Good  CLINICAL DECISION MAKING: Evolving/moderate complexity  EVALUATION COMPLEXITY: Moderate   GOALS:  SHORT TERM GOALS: Target date: 06/29/23  Pt will be Ind in an initial HEP  Baseline: started Goal status: MET   LONG TERM GOALS: Target date: 09/07/23  Pt will be Ind in a final HEP to maintain achieved LOF  Baseline: started Goal status: INITIAL  2.  Increased bilat hip strength to 4+ and L ankle DF and knee ext to 5/5 for improved functional mobility Baseline: see flow sheets Goal status: MET  3.  Pt will be able to complete a static bridge for 90 sec and a static plank from knees for 30" to demonstrate improved core strength Baseline: bridge 30" Goal status: INITIAL  4.  Increase L SLR ROM to equal the R to improve both the muscular and neural flexibility Baseline: 40d 07/10/23: 50d Goal status: MET  5.  Pt's FOTO score will improved to the predicted value of 63% as indication of improved function  Baseline: 74% Goal status: DEFERRED  PLAN:  PT FREQUENCY: 2x/week  PT DURATION: 6 weeks  PLANNED INTERVENTIONS: 97164- PT Re-evaluation, 97110-Therapeutic exercises, 97530- Therapeutic activity, 97112- Neuromuscular re-education, 97535- Self Care, [id number]- Manual therapy, [id number]- Aquatic Therapy, [id number]- Electrical stimulation (manual), Patient/Family education, Taping, Dry Needling, Spinal mobilization, Cryotherapy, and Moist heat.  PLAN FOR NEXT SESSION: Review FOTO; assess response to HEP; body mechanics; progress therex as indicated; use of modalities, manual therapy, and TPDN as indicated.  Elford Evilsizer MS, PT 07/30/23 4:42 PM

## 2023-08-01 ENCOUNTER — Ambulatory Visit: Payer: Medicaid Other | Attending: Physical Medicine and Rehabilitation

## 2023-08-01 DIAGNOSIS — M6281 Muscle weakness (generalized): Secondary | ICD-10-CM | POA: Insufficient documentation

## 2023-08-01 DIAGNOSIS — M5442 Lumbago with sciatica, left side: Secondary | ICD-10-CM | POA: Insufficient documentation

## 2023-08-03 ENCOUNTER — Encounter: Payer: Self-pay | Admitting: Physical Therapy

## 2023-08-03 ENCOUNTER — Ambulatory Visit: Payer: Medicaid Other | Admitting: Physical Therapy

## 2023-08-03 ENCOUNTER — Other Ambulatory Visit (HOSPITAL_COMMUNITY): Payer: Self-pay

## 2023-08-03 DIAGNOSIS — M5442 Lumbago with sciatica, left side: Secondary | ICD-10-CM | POA: Diagnosis not present

## 2023-08-03 DIAGNOSIS — M6281 Muscle weakness (generalized): Secondary | ICD-10-CM | POA: Diagnosis not present
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
## 2023-08-03 NOTE — Patient Instructions (Signed)

## 2023-08-06 ENCOUNTER — Ambulatory Visit: Payer: Medicaid Other | Admitting: Physical Therapy

## 2023-08-06 ENCOUNTER — Encounter: Payer: Self-pay | Admitting: Physical Therapy

## 2023-08-06 DIAGNOSIS — M5442 Lumbago with sciatica, left side: Secondary | ICD-10-CM

## 2023-08-06 DIAGNOSIS — M6281 Muscle weakness (generalized): Secondary | ICD-10-CM
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
## 2023-08-08 ENCOUNTER — Ambulatory Visit: Payer: Medicaid Other

## 2023-08-08 DIAGNOSIS — M5442 Lumbago with sciatica, left side: Secondary | ICD-10-CM | POA: Diagnosis not present

## 2023-08-08 DIAGNOSIS — M6281 Muscle weakness (generalized): Secondary | ICD-10-CM

## 2023-08-17 ENCOUNTER — Ambulatory Visit

## 2023-08-17 ENCOUNTER — Telehealth: Payer: Self-pay
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
## 2023-08-21 ENCOUNTER — Ambulatory Visit: Payer: Medicaid Other | Admitting: Family Medicine

## 2023-10-10 ENCOUNTER — Other Ambulatory Visit: Payer: Self-pay

## 2023-10-10 ENCOUNTER — Ambulatory Visit: Admitting: Family Medicine

## 2023-10-11 ENCOUNTER — Other Ambulatory Visit: Payer: Self-pay

## 2023-10-11 MED ORDER — IBUPROFEN 600 MG PO TABS
600.0000 mg | ORAL_TABLET | Freq: Four times a day (QID) | ORAL | 0 refills | Status: AC | PRN
  Filled 2023-10-11: qty 30, 8d supply, fill #0

## 2023-10-11 MED ORDER — AMOXICILLIN-POT CLAVULANATE 875-125 MG PO TABS
1.0000 | ORAL_TABLET | Freq: Two times a day (BID) | ORAL | 0 refills | Status: AC
  Filled 2023-10-11: qty 14, 7d supply, fill #0

## 2023-12-03 ENCOUNTER — Telehealth: Payer: Self-pay | Admitting: Family Medicine

## 2023-12-03 NOTE — Telephone Encounter (Signed)
Contacted pt confirmed appt

## 2023-12-03 NOTE — Telephone Encounter (Signed)
 Error

## 2023-12-03 NOTE — Telephone Encounter (Signed)
 Called pt to confirm appt. Pt will be present.

## 2023-12-04 ENCOUNTER — Encounter: Payer: Self-pay | Admitting: Family Medicine

## 2023-12-04 ENCOUNTER — Ambulatory Visit: Attending: Family Medicine | Admitting: Family Medicine

## 2023-12-04 ENCOUNTER — Other Ambulatory Visit: Payer: Self-pay

## 2023-12-04 VITALS — BP 140/95 | HR 84 | Wt 279.2 lb

## 2023-12-04 DIAGNOSIS — I10 Essential (primary) hypertension: Secondary | ICD-10-CM | POA: Diagnosis not present

## 2023-12-04 DIAGNOSIS — F32A Depression, unspecified: Secondary | ICD-10-CM

## 2023-12-04 DIAGNOSIS — G8929 Other chronic pain: Secondary | ICD-10-CM | POA: Diagnosis not present

## 2023-12-04 DIAGNOSIS — R000 Tachycardia, unspecified: Secondary | ICD-10-CM | POA: Diagnosis not present

## 2023-12-04 DIAGNOSIS — M5442 Lumbago with sciatica, left side: Secondary | ICD-10-CM

## 2023-12-04 DIAGNOSIS — Z139 Encounter for screening, unspecified: Secondary | ICD-10-CM | POA: Diagnosis not present

## 2023-12-04 DIAGNOSIS — R7303 Prediabetes: Secondary | ICD-10-CM | POA: Diagnosis not present

## 2023-12-04 DIAGNOSIS — Z1159 Encounter for screening for other viral diseases: Secondary | ICD-10-CM | POA: Diagnosis not present

## 2023-12-04 MED ORDER — AMLODIPINE BESYLATE 10 MG PO TABS
10.0000 mg | ORAL_TABLET | Freq: Every day | ORAL | 1 refills | Status: DC
  Filled 2023-12-04: qty 90, 90d supply, fill #0

## 2023-12-04 MED ORDER — LABETALOL HCL 200 MG PO TABS
200.0000 mg | ORAL_TABLET | Freq: Two times a day (BID) | ORAL | 1 refills | Status: DC
  Filled 2023-12-04: qty 180, 90d supply, fill #0

## 2023-12-04 MED ORDER — CYCLOBENZAPRINE HCL 5 MG PO TABS
5.0000 mg | ORAL_TABLET | Freq: Three times a day (TID) | ORAL | 0 refills | Status: AC | PRN
  Filled 2023-12-04: qty 60, 10d supply, fill #0

## 2023-12-04 MED ORDER — SEMAGLUTIDE-WEIGHT MANAGEMENT 0.5 MG/0.5ML ~~LOC~~ SOAJ
0.5000 mg | SUBCUTANEOUS | 0 refills | Status: AC
  Filled 2023-12-04: qty 2, 28d supply, fill #0

## 2023-12-04 MED ORDER — SEMAGLUTIDE-WEIGHT MANAGEMENT 2.4 MG/0.75ML ~~LOC~~ SOAJ
2.4000 mg | SUBCUTANEOUS | 6 refills | Status: AC
  Filled 2023-12-04: qty 3, fill #0

## 2023-12-04 MED ORDER — DULOXETINE HCL 30 MG PO CPEP
30.0000 mg | ORAL_CAPSULE | Freq: Every day | ORAL | 3 refills | Status: AC
  Filled 2023-12-04: qty 30, 30d supply, fill #0

## 2023-12-04 MED ORDER — SEMAGLUTIDE-WEIGHT MANAGEMENT 1.7 MG/0.75ML ~~LOC~~ SOAJ
1.7000 mg | SUBCUTANEOUS | 0 refills | Status: AC
  Filled 2023-12-04: qty 3, 28d supply, fill #0

## 2023-12-04 MED ORDER — SEMAGLUTIDE-WEIGHT MANAGEMENT 0.25 MG/0.5ML ~~LOC~~ SOAJ
0.2500 mg | SUBCUTANEOUS | 0 refills | Status: AC
  Filled 2023-12-04: qty 2, 28d supply, fill #0

## 2023-12-04 MED ORDER — SEMAGLUTIDE-WEIGHT MANAGEMENT 1 MG/0.5ML ~~LOC~~ SOAJ
1.0000 mg | SUBCUTANEOUS | 0 refills | Status: AC
  Filled 2023-12-04: qty 2, 28d supply, fill #0
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
## 2023-12-05 ENCOUNTER — Ambulatory Visit: Payer: Self-pay | Admitting: Family Medicine

## 2023-12-05 ENCOUNTER — Other Ambulatory Visit: Payer: Self-pay

## 2023-12-05 ENCOUNTER — Telehealth: Payer: Self-pay

## 2023-12-05 LAB — CMP14+EGFR
ALT: 19 IU/L (ref 0–32)
AST: 15 IU/L (ref 0–40)
Albumin: 4.2 g/dL (ref 3.9–4.9)
Alkaline Phosphatase: 81 IU/L (ref 44–121)
BUN/Creatinine Ratio: 13 (ref 9–23)
BUN: 10 mg/dL (ref 6–20)
Bilirubin Total: <0.2 mg/dL (ref 0.0–1.2)
CO2: 21 mmol/L (ref 20–29)
Calcium: 9.2 mg/dL (ref 8.7–10.2)
Chloride: 103 mmol/L (ref 96–106)
Creatinine, Ser: 0.75 mg/dL (ref 0.57–1.00)
Globulin, Total: 3.3 g/dL (ref 1.5–4.5)
Glucose: 134 mg/dL — ABNORMAL HIGH (ref 70–99)
Potassium: 3.4 mmol/L — ABNORMAL LOW (ref 3.5–5.2)
Sodium: 141 mmol/L (ref 134–144)
Total Protein: 7.5 g/dL (ref 6.0–8.5)
eGFR: 106 mL/min/1.73 (ref >59)

## 2023-12-05 LAB — HCV INTERPRETATION

## 2023-12-05 LAB — LP+NON-HDL CHOLESTEROL
Cholesterol, Total: 178 mg/dL (ref 100–199)
HDL: 39 mg/dL — ABNORMAL LOW (ref >39)
LDL Chol Calc (NIH): 123 mg/dL — ABNORMAL HIGH (ref 0–99)
Total Non-HDL-Chol (LDL+VLDL): 139 mg/dL — ABNORMAL HIGH (ref 0–129)
Triglycerides: 83 mg/dL (ref 0–149)
VLDL Cholesterol Cal: 16 mg/dL (ref 5–40)

## 2023-12-05 LAB — HEMOGLOBIN A1C
Est. average glucose Bld gHb Est-mCnc: 108 mg/dL
Hgb A1c MFr Bld: 5.4 % (ref 4.8–5.6)

## 2023-12-05 LAB — HCV AB W REFLEX TO QUANT PCR: HCV Ab: NONREACTIVE
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
## 2023-12-13 ENCOUNTER — Other Ambulatory Visit: Payer: Self-pay

## 2023-12-17 ENCOUNTER — Other Ambulatory Visit: Payer: Self-pay

## 2024-01-12 ENCOUNTER — Encounter (HOSPITAL_COMMUNITY): Payer: Self-pay

## 2024-01-12 ENCOUNTER — Ambulatory Visit (HOSPITAL_COMMUNITY)
Admission: RE | Admit: 2024-01-12 | Discharge: 2024-01-12 | Disposition: A | Discharge status: Home / Self Care | Source: Ambulatory Visit | Attending: Emergency Medicine | Admitting: Emergency Medicine

## 2024-01-12 VITALS — BP 170/130 | HR 90 | Temp 98.5°F | Resp 18 | Ht 68.0 in

## 2024-01-12 DIAGNOSIS — I10 Essential (primary) hypertension: Secondary | ICD-10-CM | POA: Diagnosis not present

## 2024-01-12 DIAGNOSIS — H5789 Other specified disorders of eye and adnexa: Secondary | ICD-10-CM

## 2024-01-12 MED ORDER — OLOPATADINE HCL 0.1 % OP SOLN
1.0000 [drp] | Freq: Two times a day (BID) | OPHTHALMIC | 0 refills | Status: AC
  Filled 2024-01-12: qty 5, 25d supply, fill #0
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
## 2024-01-12 NOTE — Discharge Instructions (Addendum)
 Use Pataday eyedrops twice daily for eye irritation. You can follow-up with [facility] ophthalmology for further evaluation if your eye irritation continues. Be sure you are taking a blood pressure medication daily as prescribed. Follow-up with your primary care provider or return here as needed.

## 2024-01-12 NOTE — ED Provider Notes (Signed)
 [facility]    CSN: [id number] Arrival date & time: 01/12/24  1529      History   Chief Complaint Chief Complaint  Patient presents with   Eye Problem   Appointment    HPI Dana Allison is a 38 y.o. female.   Patient presents with left eye irritation, itching, and watering x 2 days.  Patient states that she has had issues with this on and off for about 2 weeks.  Patient states that at times she will have blurry vision when her eyes become very watery. Patient denies any trauma or injury to her eye.  Denies foreign body sensation.  Patient reports that she has been using over-the-counter eyedrops with minimal relief.  Denies wearing contacts or glasses.  Of note patient's blood pressure is elevated at 170/130.  Patient does have a history of hypertension.  Patient states that she did not take her blood pressure medication today.  Patient states that she did not run out of her blood pressure medication, but reports that she just did not take it today.  Patient denies any symptoms related to this.  The history is provided by the patient and medical records.  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/01/2020   Morbid obesity (HCC)    Hypertension    Pre-diabetes    DUB (dysfunctional uterine bleeding) 09/19/2018   OBESITY, NOS 07/26/2006    Past Surgical History:  Procedure Laterality Date   HYSTEROSCOPY WITH D & C N/A 04/08/2019   Procedure: DILATATION AND CURETTAGE /HYSTEROSCOPY WITH ENDOMETRIAL POLYPECTOMY;  Surgeon: Lorence Ozell CROME, MD;  Location: [facility] OR;   Service: Gynecology;  Laterality: N/A;   NO PAST SURGERIES      OB History     Gravida  0   Para  0   Term  0   Preterm  0   AB  0   Living  0      SAB  0   IAB  0   Ectopic  0   Multiple  0   Live Births  0            Home Medications    Prior to Admission medications   Medication Sig Start Date End Date Taking? Authorizing Provider  amLODipine (NORVASC) 10 MG tablet Take 1 tablet (10 mg total) by mouth daily to lower blood pressure 12/04/23  Yes Newlin, Enobong, MD  Blood Pressure Monitoring (BLOOD PRESSURE CUFF) MISC 1 each by Does not apply route daily. 11/08/22  Yes McClung, Jon HERO, PA-C  cyclobenzaprine (FLEXERIL) 5 MG tablet Take 1-2 tablets (5-10 mg dose) by mouth 3 (three) times a day as needed for Muscle spasms for up to 10 days. 12/04/23  Yes Newlin, Enobong, MD  DULoxetine (CYMBALTA) 30 MG capsule Take 1 capsule (30 mg total) by mouth daily. 12/04/23  Yes Newlin, Enobong, MD  ibuprofen (ADVIL) 600 MG tablet Take 1 tablet (600 mg total) by mouth every 6 (six) hours as needed for pain. 10/11/23  Yes  labetalol (NORMODYNE) 200 MG tablet Take 1 tablet (200 mg total) by mouth 2 (two) times daily. 12/04/23  Yes Newlin, Enobong, MD  meloxicam (MOBIC) 15 MG tablet Take 1 tablet (15 mg total) by mouth daily. 05/15/23 05/14/24 Yes Williams, Megan E, NP  methocarbamol (ROBAXIN) 500 MG tablet Take 1 tablet (500 mg total) by mouth 3 (three) times daily. 05/15/23  Yes Williams, Megan E, NP  olopatadine (PATADAY) 0.1 % ophthalmic solution Place 1 drop into both eyes 2 (two) times daily. 01/12/24  Yes Johnie Flaming A, NP  predniSONE (DELTASONE) 20 MG tablet Take 1 tablet (20 mg total) by mouth daily with breakfast. 04/02/23  Yes Arloa Suzen RAMAN, NP  Semaglutide-Weight Management 1.7 MG/0.75ML SOAJ Inject 1.7 mg into the skin once a week for 28 days. Then increase to 2.4mg  02/29/24 03/28/24 Yes Newlin, Enobong, MD  traMADol (ULTRAM) 50 MG tablet Take 1 tablet (50 mg total)  by mouth every 6 (six) hours as needed. 04/05/23  Yes Persons, Ronal Dragon, [place]  Semaglutide-Weight Management 0.5 MG/0.5ML SOAJ Inject 0.5 mg into the skin once a week for 28 days. Then increase to 1 mg 01/02/24 01/30/24  Newlin, Enobong, MD  Semaglutide-Weight Management 1 MG/0.5ML SOAJ Inject 1 mg into the skin once a week for 28 days. Then increase to 1.7 mg 01/31/24 02/28/24  Newlin, Enobong, MD  Semaglutide-Weight Management 2.4 MG/0.75ML SOAJ Inject 2.4 mg into the skin once a week. 03/29/24   Delbert Clam, MD    Family History Family History  Problem Relation Age of Onset   Hypertension Mother    Diabetes Mother    Diabetes Father    Hypertension Sister    Hypertension Sister     Social History Social History   Tobacco Use   Smoking status: Never   Smokeless tobacco: Never  Vaping Use   Vaping status: Never Used  Substance Use Topics   Alcohol use: Yes    Comment: occasionally   Drug use: No     Allergies   Patient has no known allergies.   Review of Systems Review of Systems  Per HPI  Physical Exam Triage Vital Signs ED Triage Vitals  Encounter Vitals Group     BP 01/12/24 1549 (!) 170/130     Girls Systolic BP Percentile --      Girls Diastolic BP Percentile --      Boys Systolic BP Percentile --      Boys Diastolic BP Percentile --      Pulse Rate 01/12/24 1549 90     Resp 01/12/24 1549 18     Temp 01/12/24 1549 98.5 F (36.9 C)     Temp Source 01/12/24 1549 Oral     SpO2 01/12/24 1549 97 %     Weight --      Height 01/12/24 1549 5' 8 (1.727 m)     Head Circumference --      Peak Flow --      Pain Score 01/12/24 1547 0     Pain Loc --      Pain Education --      Exclude from Growth Chart --    No data found.  Updated Vital Signs BP (!) 170/130 (BP Location: Left Wrist)   Pulse 90   Temp 98.5 F (36.9 C) (Oral)   Resp 18   Ht 5' 8 (1.727 m)   LMP 12/28/2023 (Approximate)   SpO2 97%   BMI 42.45 kg/m   Visual Acuity Right Eye Distance:  Left Eye Distance:   Bilateral Distance:    Right Eye Near:   Left Eye Near:    Bilateral Near:     Physical Exam Vitals and nursing note reviewed.  Constitutional:      General: She is awake. She is not in acute distress.    Appearance: Normal appearance. She is well-developed and well-groomed. She is not ill-appearing.  Eyes:     General: Lids are normal.        Left eye: No foreign body, discharge or hordeolum.     Extraocular Movements: Extraocular movements intact.     Conjunctiva/sclera: Conjunctivae normal.     Left eye: Left conjunctiva is not injected. No chemosis, exudate or hemorrhage.    Pupils: Pupils are equal, round, and reactive to light.  Skin:    General: Skin is warm and dry.  Neurological:     Mental Status: She is alert.  Psychiatric:        Behavior: Behavior is cooperative.      UC Treatments / Results  Labs (all labs ordered are listed, but only abnormal results are displayed) Labs Reviewed - No data to display  EKG   Radiology No results found.  Procedures Procedures (including critical care time)  Medications Ordered in UC Medications - No data to display  Initial Impression / Assessment and Plan / UC Course  I have reviewed the triage vital signs and the nursing notes.  Pertinent labs & imaging results that were available during my care of the patient were reviewed by me and considered in my medical decision making (see chart for details).     Patient is overall well-appearing.  Vitals are stable however blood pressure is elevated at 170/130.  No significant findings on exam.  Prescribed Pataday for eye irritation.  Given ophthalmology follow-up if needed.  Discussed importance taking blood pressure medication daily as prescribed.  Discussed follow-up and return precautions. Final Clinical Impressions(s) / UC Diagnoses   Final diagnoses:  Eye irritation  Elevated blood pressure reading in office with diagnosis of hypertension      Discharge Instructions      Use Pataday eyedrops twice daily for eye irritation. You can follow-up with [facility] ophthalmology for further evaluation if your eye irritation continues. Be sure you are taking a blood pressure medication daily as prescribed. Follow-up with your primary care provider or return here as needed.   ED Prescriptions     Medication Sig Dispense Auth. Provider   olopatadine (PATADAY) 0.1 % ophthalmic solution Place 1 drop into both eyes 2 (two) times daily. 5 mL Johnie Flaming A, NP      PDMP not reviewed this encounter.   Johnie Flaming A, NP 01/12/24 [phone]

## 2024-01-14 ENCOUNTER — Other Ambulatory Visit: Payer: Self-pay

## 2024-01-17 ENCOUNTER — Other Ambulatory Visit: Payer: Self-pay

## 2024-01-23 ENCOUNTER — Other Ambulatory Visit: Payer: Self-pay

## 2024-02-05 DIAGNOSIS — R110 Nausea: Secondary | ICD-10-CM | POA: Diagnosis not present

## 2024-02-05 DIAGNOSIS — R102 Pelvic and perineal pain: Secondary | ICD-10-CM | POA: Diagnosis not present

## 2024-02-05 DIAGNOSIS — R531 Weakness: Secondary | ICD-10-CM | POA: Diagnosis not present

## 2024-02-05 DIAGNOSIS — D72829 Elevated white blood cell count, unspecified: Secondary | ICD-10-CM | POA: Diagnosis not present

## 2024-02-05 DIAGNOSIS — Z833 Family history of diabetes mellitus: Secondary | ICD-10-CM | POA: Diagnosis not present

## 2024-02-05 DIAGNOSIS — Z87410 Personal history of cervical dysplasia: Secondary | ICD-10-CM | POA: Diagnosis not present

## 2024-02-05 DIAGNOSIS — N7093 Salpingitis and oophoritis, unspecified: Secondary | ICD-10-CM | POA: Diagnosis not present

## 2024-02-05 DIAGNOSIS — Z79899 Other long term (current) drug therapy: Secondary | ICD-10-CM | POA: Diagnosis not present

## 2024-02-05 DIAGNOSIS — A419 Sepsis, unspecified organism: Secondary | ICD-10-CM | POA: Diagnosis not present

## 2024-02-05 DIAGNOSIS — R000 Tachycardia, unspecified: Secondary | ICD-10-CM | POA: Diagnosis not present

## 2024-02-05 DIAGNOSIS — I10 Essential (primary) hypertension: Secondary | ICD-10-CM | POA: Diagnosis not present

## 2024-02-05 DIAGNOSIS — R188 Other ascites: Secondary | ICD-10-CM | POA: Diagnosis not present

## 2024-02-05 DIAGNOSIS — Z6841 Body Mass Index (BMI) 40.0 and over, adult: Secondary | ICD-10-CM | POA: Diagnosis not present

## 2024-02-05 DIAGNOSIS — R7303 Prediabetes: Secondary | ICD-10-CM | POA: Diagnosis not present

## 2024-02-05 DIAGNOSIS — R509 Fever, unspecified: Secondary | ICD-10-CM | POA: Diagnosis not present

## 2024-02-05 DIAGNOSIS — E669 Obesity, unspecified: Secondary | ICD-10-CM | POA: Diagnosis not present

## 2024-02-05 DIAGNOSIS — R1032 Left lower quadrant pain: Secondary | ICD-10-CM | POA: Diagnosis not present

## 2024-02-05 DIAGNOSIS — M62838 Other muscle spasm: Secondary | ICD-10-CM | POA: Diagnosis not present

## 2024-02-05 DIAGNOSIS — R109 Unspecified abdominal pain: Secondary | ICD-10-CM | POA: Diagnosis not present

## 2024-02-05 DIAGNOSIS — Z8249 Family history of ischemic heart disease and other diseases of the circulatory system: Secondary | ICD-10-CM | POA: Diagnosis not present

## 2024-02-05 DIAGNOSIS — I160 Hypertensive urgency: Secondary | ICD-10-CM | POA: Diagnosis not present

## 2024-02-06 DIAGNOSIS — R1030 Lower abdominal pain, unspecified: Secondary | ICD-10-CM | POA: Diagnosis not present

## 2024-02-06 DIAGNOSIS — R509 Fever, unspecified: Secondary | ICD-10-CM | POA: Diagnosis not present

## 2024-02-06 DIAGNOSIS — D72829 Elevated white blood cell count, unspecified: Secondary | ICD-10-CM | POA: Diagnosis not present

## 2024-02-06 DIAGNOSIS — N7093 Salpingitis and oophoritis, unspecified: Secondary | ICD-10-CM | POA: Diagnosis not present

## 2024-02-06 DIAGNOSIS — R000 Tachycardia, unspecified: Secondary | ICD-10-CM | POA: Diagnosis not present

## 2024-02-08 DIAGNOSIS — D72829 Elevated white blood cell count, unspecified: Secondary | ICD-10-CM | POA: Diagnosis not present

## 2024-02-08 DIAGNOSIS — R1030 Lower abdominal pain, unspecified: Secondary | ICD-10-CM | POA: Diagnosis not present

## 2024-02-08 DIAGNOSIS — N7093 Salpingitis and oophoritis, unspecified: Secondary | ICD-10-CM | POA: Diagnosis not present

## 2024-02-11 ENCOUNTER — Telehealth: Payer: Self-pay | Admitting: *Deleted

## 2024-02-11 NOTE — Transitions of Care (Post Inpatient/ED Visit) (Signed)
 02/11/2024  Name: Dana Allison MRN: [id number] DOB: Mar 26, 1986  Today's TOC FU Call Status: Today's TOC FU Call Status:: Successful TOC FU Call Completed TOC FU Call Complete Date: 02/11/24 Patient's Name and Date of Birth confirmed.  Transition Care Management Follow-up Telephone Call Date of Discharge: 02/11/24 Discharge Facility: Other [facility]) Name of Other (Non[facility]) Discharge Facility: [facility] Type of Discharge: Inpatient Admission Primary Inpatient Discharge Diagnosis:: Left tubo-ovarian abscess How have you been since you were released from the hospital?: Better Any questions or concerns?: No  Items Reviewed: Did you receive and understand the discharge instructions provided?: Yes Medications obtained,verified, and reconciled?: Yes (Medications Reviewed) Any new allergies since your discharge?: No Dietary orders reviewed?: Yes Type of Diet Ordered:: Regular Do you have support at home?: No  Medications Reviewed Today: Medications Reviewed Today     Reviewed by Lucky Andrea LABOR, RN (Registered Nurse) on 02/11/24 at 1057  Med List Status: <None>   Medication Order Taking? Sig Documenting Provider Last Dose Status Informant  amLODipine  (NORVASC ) 10 MG tablet [id number]  Take 1 tablet (10 mg total) by mouth daily to lower blood pressure  Patient not taking: Reported on 02/11/2024   Newlin, Enobong, MD  Active   Blood Pressure Monitoring (BLOOD PRESSURE CUFF) MISC [id number]  1 each by Does not apply route daily.  Patient not taking: Reported on 02/11/2024   Danton Jon HERO, PA-C  Active   cyclobenzaprine  (FLEXERIL ) 5 MG tablet [id number] Yes Take 1-2 tablets (5-10 mg dose) by mouth 3 (three) times a day as needed for Muscle spasms for up to 10 days. Newlin, Enobong, MD  Active   doxycycline  (MONODOX ) 100 MG capsule [id number] Yes Take 100 mg by mouth 2 (two) times daily. [provider]  Active   DULoxetine  (CYMBALTA ) 30 MG capsule  [id number]  Take 1 capsule (30 mg total) by mouth daily. Newlin, Enobong, MD  Active   ibuprofen  (ADVIL ) 600 MG tablet [id number] Yes Take 1 tablet (600 mg total) by mouth every 6 (six) hours as needed for pain.  Patient taking differently: Take 800 mg by mouth every 8 (eight) hours as needed.     Active   labetalol  (NORMODYNE ) 200 MG tablet [id number] Yes Take 1 tablet (200 mg total) by mouth 2 (two) times daily. Newlin, Enobong, MD  Active   meloxicam  (MOBIC ) 15 MG tablet [id number]  Take 1 tablet (15 mg total) by mouth daily.  Patient not taking: Reported on 02/11/2024   Williams, Megan E, NP  Active   methocarbamol  (ROBAXIN ) 500 MG tablet [id number]  Take 1 tablet (500 mg total) by mouth 3 (three) times daily.  Patient not taking: Reported on 02/11/2024   Williams, Megan E, NP  Active   metroNIDAZOLE  (FLAGYL ) 500 MG tablet [id number] Yes Take 500 mg by mouth 2 (two) times daily. [provider]  Active   olopatadine  (PATADAY ) 0.1 % ophthalmic solution [id number]  Place 1 drop into both eyes 2 (two) times daily.  Patient not taking: Reported on 02/11/2024   Johnie Flaming A, NP  Active   predniSONE  (DELTASONE ) 20 MG tablet [id number]  Take 1 tablet (20 mg total) by mouth daily with breakfast.  Patient not taking: Reported on 02/11/2024   Arloa Suzen RAMAN, NP  Active   Semaglutide -Weight Management 1 MG/0.5ML EMMANUEL [id number]  Inject 1 mg into the skin once a week for 28 days. Then increase to 1.7 mg  Patient not taking: Reported on 02/11/2024   Newlin, Enobong,  MD  Active   Semaglutide -Weight Management 1.7 MG/0.75ML SOAJ [id number]  Inject 1.7 mg into the skin once a week for 28 days. Then increase to 2.4mg   Patient not taking: Reported on 02/11/2024   Newlin, Enobong, MD  Active   Semaglutide -Weight Management 2.4 MG/0.75ML SOAJ [id number]  Inject 2.4 mg into the skin once a week.  Patient not taking: Reported on 02/11/2024   Newlin, Enobong, MD  Active   traMADol  (ULTRAM ) 50 MG tablet  [id number] Yes Take 1 tablet (50 mg total) by mouth every 6 (six) hours as needed. Persons, Ronal Dragon, [place]  Active             Home Care and Equipment/Supplies: Were Home Health Services Ordered?: No Any new equipment or medical supplies ordered?: No  Functional Questionnaire: Do you need assistance with bathing/showering or dressing?: No Do you need assistance with meal preparation?: No Do you need assistance with eating?: No Do you have difficulty maintaining continence: No Do you need assistance with getting out of bed/getting out of a chair/moving?: No Do you have difficulty managing or taking your medications?: No  Follow up appointments reviewed: PCP Follow-up appointment confirmed?: No (RNCM assisted with scheduling on 03/11/24.(First available with PCP-per patient request)) MD Provider Line Number:[phone] Given: No Specialist Hospital Follow-up appointment confirmed?: NA Do you need transportation to your follow-up appointment?: No Do you understand care options if your condition(s) worsen?: Yes-patient verbalized understanding  SDOH Interventions Today    Flowsheet Row Most Recent Value  SDOH Interventions   Food Insecurity Interventions Intervention Not Indicated  Housing Interventions Intervention Not Indicated  Transportation Interventions Intervention Not Indicated  Utilities Interventions Intervention Not Indicated    Andrea Dimes RN, BSN [place]  [facility] RN Care Manager [phone]

## 2024-03-11 ENCOUNTER — Inpatient Hospital Stay: Admitting: Family Medicine

## 2024-03-14 ENCOUNTER — Inpatient Hospital Stay: Admitting: Family Medicine

## 2024-03-14 ENCOUNTER — Encounter: Payer: Self-pay | Admitting: Family Medicine

## 2024-03-14 ENCOUNTER — Ambulatory Visit (HOSPITAL_BASED_OUTPATIENT_CLINIC_OR_DEPARTMENT_OTHER): Admitting: Family Medicine

## 2024-03-14 DIAGNOSIS — I10 Essential (primary) hypertension: Secondary | ICD-10-CM | POA: Diagnosis not present

## 2024-03-14 DIAGNOSIS — N7093 Salpingitis and oophoritis, unspecified: Secondary | ICD-10-CM | POA: Diagnosis not present

## 2024-03-14 NOTE — Progress Notes (Signed)
 Virtual Visit via Video Note  I connected with Dana Allison, on 03/14/2024 at 1:15 PM by video enabled telemedicine device and verified that I am speaking with the correct person using two identifiers.   Consent: I discussed the limitations, risks, security and privacy concerns of performing an evaluation and management service by telemedicine and the availability of in person appointments. I also discussed with the patient that there may be a patient responsible charge related to this service. The patient expressed understanding and agreed to proceed.   Location of Patient: Home  Location of Provider: Clinic   Persons participating in Telemedicine visit: Dana Allison Dr. Delbert    Discussed the use of AI scribe software for clinical note transcription with the patient, who gave verbal consent to proceed.  History of Present Illness Dana Allison is a 38 year old female with history of hypertension, prediabetes, back pain who presents for follow-up after hospital discharge for a tubo-ovarian abscess.   She was admitted to [facility] and treated with antibiotics during her hospital stay and completed a two-week course post-discharge. She reports significant improvement with no current abdominal pain or fever.  Discharge labs revealed normal WBC but slight anemia with hemoglobin of 10.5.   She is scheduled for a follow-up with her gynecologist at the end of next month.  Her blood pressure was elevated during her hospital stay, which she has attributed to pain. She is currently taking amlodipine  10 mg daily and labetalol  200 mg three times a day. She monitors her blood pressure at her mother's house, with readings around 157/87 mmHg. Her blood pressure has improved since her pain eased.      Past Medical History:  Diagnosis Date   Endometrial polyp 03/17/2019   Hypertension    Morbid obesity (HCC)    Pre-diabetes    per patient   No Known  Allergies  Current Outpatient Medications on File Prior to Visit  Medication Sig Dispense Refill   amLODipine  (NORVASC ) 10 MG tablet Take 1 tablet (10 mg total) by mouth daily to lower blood pressure (Patient not taking: Reported on 02/11/2024) 90 tablet 1   Blood Pressure Monitoring (BLOOD PRESSURE CUFF) MISC 1 each by Does not apply route daily. (Patient not taking: Reported on 02/11/2024) 1 each 0   cyclobenzaprine  (FLEXERIL ) 5 MG tablet Take 1-2 tablets (5-10 mg dose) by mouth 3 (three) times a day as needed for Muscle spasms for up to 10 days. 60 tablet 0   doxycycline  (MONODOX ) 100 MG capsule Take 100 mg by mouth 2 (two) times daily.     DULoxetine  (CYMBALTA ) 30 MG capsule Take 1 capsule (30 mg total) by mouth daily. 30 capsule 3   ibuprofen  (ADVIL ) 600 MG tablet Take 1 tablet (600 mg total) by mouth every 6 (six) hours as needed for pain. (Patient taking differently: Take 800 mg by mouth every 8 (eight) hours as needed.) 30 tablet 0   labetalol  (NORMODYNE ) 200 MG tablet Take 1 tablet (200 mg total) by mouth 2 (two) times daily. 180 tablet 1   meloxicam  (MOBIC ) 15 MG tablet Take 1 tablet (15 mg total) by mouth daily. (Patient not taking: Reported on 02/11/2024) 30 tablet 0   methocarbamol  (ROBAXIN ) 500 MG tablet Take 1 tablet (500 mg total) by mouth 3 (three) times daily. (Patient not taking: Reported on 02/11/2024) 90 tablet 0   metroNIDAZOLE  (FLAGYL ) 500 MG tablet Take 500 mg by mouth 2 (two) times daily.  olopatadine  (PATADAY ) 0.1 % ophthalmic solution Place 1 drop into both eyes 2 (two) times daily. (Patient not taking: Reported on 02/11/2024) 5 mL 0   predniSONE  (DELTASONE ) 20 MG tablet Take 1 tablet (20 mg total) by mouth daily with breakfast. (Patient not taking: Reported on 02/11/2024) 10 tablet 0   Semaglutide -Weight Management 1.7 MG/0.75ML SOAJ Inject 1.7 mg into the skin once a week for 28 days. Then increase to 2.4mg  (Patient not taking: Reported on 02/11/2024) 3 mL 0   [START ON  03/29/2024] Semaglutide -Weight Management 2.4 MG/0.75ML SOAJ Inject 2.4 mg into the skin once a week. (Patient not taking: Reported on 02/11/2024) 3 mL 6   traMADol  (ULTRAM ) 50 MG tablet Take 1 tablet (50 mg total) by mouth every 6 (six) hours as needed. 30 tablet 0   No current facility-administered medications on file prior to visit.    ROS: See HPI  Observations/Objective: Awake, alert, oriented x3 Not in acute distress Normal mood      Latest Ref Rng & Units 12/04/2023   11:42 AM 04/11/2023   11:15 AM 11/08/2022    4:12 PM  CMP  Glucose 70 - 99 mg/dL 865  81  883   BUN 6 - 20 mg/dL 10  8  13    Creatinine 0.57 - 1.00 mg/dL 9.24  9.21  9.22   Sodium 134 - 144 mmol/L 141  140  140   Potassium 3.5 - 5.2 mmol/L 3.4  3.9  4.0   Chloride 96 - 106 mmol/L 103  105  103   CO2 20 - 29 mmol/L 21  19  23    Calcium 8.7 - 10.2 mg/dL 9.2  9.2  9.7   Total Protein 6.0 - 8.5 g/dL 7.5  7.4    Total Bilirubin 0.0 - 1.2 mg/dL <9.7  0.3    Alkaline Phos 44 - 121 IU/L 81  74    AST 0 - 40 IU/L 15  18    ALT 0 - 32 IU/L 19  26      Lipid Panel     Component Value Date/Time   CHOL 178 12/04/2023 1142   TRIG 83 12/04/2023 1142   HDL 39 (L) 12/04/2023 1142   CHOLHDL 4.3 02/17/2021 1510   LDLCALC 123 (H) 12/04/2023 1142   LABVLDL 16 12/04/2023 1142    Lab Results  Component Value Date   HGBA1C 5.4 12/04/2023    CBC    Component Value Date/Time   WBC 11.2 (H) 04/11/2023 1115   WBC 12.9 (H) 04/02/2022 1702   RBC 4.37 04/11/2023 1115   RBC 4.29 04/02/2022 1702   HGB 13.2 04/11/2023 1115   HCT 40.7 04/11/2023 1115   PLT 279 04/11/2023 1115   MCV 93 04/11/2023 1115   MCH 30.2 04/11/2023 1115   MCH 31.2 04/02/2022 1702   MCHC 32.4 04/11/2023 1115   MCHC 34.0 04/02/2022 1702   RDW 13.1 04/11/2023 1115   LYMPHSABS 2.2 04/11/2023 1115   MONOABS 0.9 04/02/2022 1702   EOSABS 0.1 04/11/2023 1115   BASOSABS 0.0 04/11/2023 1115     Assessment and plan:  Assessment &  Plan Tubo-ovarian abscess Asymptomatic - Completed course of antibiotics -Labs revealed slight anemia.  Plan to check CBC at next visit. - Follow-up with GYN  Hypertension Blood pressure elevated at 157/87 mmHg. On amlodipine  10 mg daily and labetalol  200 mg TID. Likely elevated due to pain from abscess per patient - Reassess blood pressure control and medication regimen in one month  during an in person office visit - Bring home blood pressure readings to follow-up for comparison. -Counseled on blood pressure goal of less than 130/80, low-sodium, DASH diet, medication compliance, 150 minutes of moderate intensity exercise per week. Discussed medication compliance, adverse effects.       No orders of the defined types were placed in this encounter.   Follow Up Instructions: Return in about 1 month (around 04/14/2024) for Blood pressure follow-up with Herlene.    I discussed the assessment and treatment plan with the patient. The patient was provided an opportunity to ask questions and all were answered. The patient agreed with the plan and demonstrated an understanding of the instructions.   The patient was advised to call back or seek an in-person evaluation if the symptoms worsen or if the condition fails to improve as anticipated.     I provided 15 minutes total of Telehealth time during this encounter including median intraservice time, reviewing previous notes, investigations, ordering medications, medical decision making, coordinating care and patient verbalized understanding at the end of the visit.     Corrina Sabin, MD, FAAFP. [facility] [place], [place] [phone]   03/14/2024, 1:15 PM

## 2024-03-14 NOTE — Patient Instructions (Signed)
 VISIT SUMMARY:  Today, you came in for a follow-up visit after your recent hospital discharge for a tubo-ovarian abscess. You have completed your antibiotics and are feeling much better, with no current abdominal pain or fever. We also discussed your blood pressure, which was elevated during your hospital stay but has improved since your pain has eased.  YOUR PLAN:  -TUBO-OVARIAN ABSCESS: A tubo-ovarian abscess is an infection that forms a pocket of pus near the ovary and fallopian tube. You have completed your antibiotics and are feeling better. You have a follow-up appointment with your gynecologist at the end of next month to ensure everything is healing properly.  -HYPERTENSION: Hypertension means high blood pressure. Your blood pressure was high during your hospital stay, likely due to pain from the abscess. You are currently taking amlodipine  10 mg daily and labetalol  200 mg three times a day. We will reassess your blood pressure and medication regimen in one month. Please bring your home blood pressure readings to your next appointment for comparison.  INSTRUCTIONS:  Please follow up in one month to reassess your blood pressure control and medication regimen. Remember to bring your home blood pressure readings to your next appointment.

## 2024-03-31 ENCOUNTER — Encounter: Payer: Self-pay | Admitting: Radiology

## 2024-04-22 ENCOUNTER — Other Ambulatory Visit: Payer: Self-pay

## 2024-04-22 ENCOUNTER — Ambulatory Visit

## 2024-04-22 VITALS — BP 126/91 | HR 94 | Ht 68.0 in | Wt 260.0 lb

## 2024-04-22 DIAGNOSIS — I10 Essential (primary) hypertension: Secondary | ICD-10-CM

## 2024-04-22 DIAGNOSIS — R000 Tachycardia, unspecified: Secondary | ICD-10-CM | POA: Diagnosis not present

## 2024-04-22 DIAGNOSIS — N7093 Salpingitis and oophoritis, unspecified: Secondary | ICD-10-CM

## 2024-04-22 MED ORDER — AMLODIPINE BESYLATE 10 MG PO TABS
10.0000 mg | ORAL_TABLET | Freq: Two times a day (BID) | ORAL | 1 refills | Status: DC
  Filled 2024-04-22: qty 90, 45d supply, fill #0

## 2024-04-22 MED ORDER — LABETALOL HCL 200 MG PO TABS
200.0000 mg | ORAL_TABLET | Freq: Three times a day (TID) | ORAL | 1 refills | Status: AC
  Filled 2024-04-22: qty 180, 60d supply, fill #0

## 2024-04-22 NOTE — Progress Notes (Signed)
   GYNECOLOGY PROGRESS NOTE  History:  38 y.o. G0P0000 presents to [facility] office today for problem gyn visit. She presents for follow-up after a L tubo-ovarian abscess in September. Was started on unasyn and doxycycline  IV. Discharged in stable condition on doxycyline and metronidazole  PO. Finished the medications, didn't miss any doses. She denies h/a, dizziness, shortness of breath, n/v, or fever/chills. Asking possible causes of diagnosis and subsequent hospitalization.  Also noted to be hypertensive during appointment today. Discharge summary notes increase of labetalol  to 200mg  TID, also taking amlodipine  10mg  BID. Doesn't take her blood pressure at ome, doesn't have a cuff. Has visit with PCP on December 3rd.   The following portions of the patient's history were reviewed and updated as appropriate: allergies, current medications, past family history, past medical history, past social history, past surgical history and problem list.   Health Maintenance Due  Topic Date Due   COVID-19 Vaccine (1) Never done   Hepatitis B Vaccines 19-59 Average Risk (1 of 3 - 19+ 3-dose series) Never done   Influenza Vaccine  Never done     Review of Systems:  Pertinent items are noted in HPI.   Objective:  Physical Exam Blood pressure (!) 126/91, pulse 94, height 5' 8 (1.727 m), weight 260 lb (117.9 kg), last menstrual period 04/06/2024. VS reviewed, nursing note reviewed,  Constitutional: well developed, well nourished, no distress HEENT: normocephalic CV: normal rate Pulm/chest wall: normal effort Breast Exam: deferred Abdomen: soft Neuro: alert and oriented x 3 Skin: warm, dry Psych: affect normal Pelvic exam: deferred Bimanual exam: deferred  Assessment & Plan:  1. Left tubo-ovarian abscess (Primary) Seen after admission in September for suspected L tubo-ovarian abscess Discussed possible causes including PID but CT/NG testing negative Doing well overall, denies fever, chills,  abdominal or pelvic pain  2. Essential hypertension Updated medication list to reflect recent medication changes, will defer further management to PCP as patient has upcoming appointment on December 3rd - amLODipine  (NORVASC ) 10 MG tablet; Take 1 tablet (10 mg total) by mouth in the morning and at bedtime.  Dispense: 90 tablet; Refill: 1 - labetalol  (NORMODYNE ) 200 MG tablet; Take 1 tablet (200 mg total) by mouth 3 (three) times daily.  Dispense: 180 tablet; Refill: 1 - labetalol  (NORMODYNE ) 200 MG tablet; Take 1 tablet (200 mg total) by mouth 3 (three) times daily.  Dispense: 180 tablet; Refill: 1   Return if symptoms worsen or fail to improve.   Charlie DELENA Courts, MD 5:18 PM

## 2024-04-29 ENCOUNTER — Encounter: Payer: Self-pay | Admitting: Family Medicine

## 2024-04-29 ENCOUNTER — Ambulatory Visit: Attending: Family Medicine | Admitting: Family Medicine

## 2024-04-29 VITALS — BP 122/80 | HR 90 | Temp 98.1°F | Ht 68.0 in | Wt 295.8 lb

## 2024-04-29 DIAGNOSIS — I10 Essential (primary) hypertension: Secondary | ICD-10-CM

## 2024-04-29 DIAGNOSIS — N7093 Salpingitis and oophoritis, unspecified: Secondary | ICD-10-CM | POA: Diagnosis not present

## 2024-04-29 MED ORDER — AMLODIPINE BESYLATE 10 MG PO TABS: 10.0000 mg | ORAL_TABLET | Freq: Every day | ORAL | 1 refills | Status: AC

## 2024-04-29 NOTE — Progress Notes (Signed)
 Subjective:  Patient ID: Dana Allison, female    DOB: 04/12/1986  Age: 38 y.o. MRN: [id number]  CC: Medical Management of Chronic Issues (Has pre-employment form to be completed)     Discussed the use of AI scribe software for clinical note transcription with the patient, who gave verbal consent to proceed.  History of Present Illness Dana Allison is a 38 year old female with hypertension, prediabetes who presents for reemployment clearance after elevated blood pressure was noted at work.  She is here because a job physical showed an elevated blood pressure after she skipped her morning blood pressure medication. She is asymptomatic today.  She takes amlodipine  twice daily despite the prescription being once daily at the time of discharge from [facility], and labetalol  200 mg three times daily. These were started after a September 9 hospital admission for tubo-ovarian abscess with sepsis. She completed additional antibiotics from that admission.  Last prescription from her GYN indicated twice a day dosing of amlodipine  on her med list. She expresses a desire to have repeat imaging to follow-up on her tubo-ovarian abscess given though she is asymptomatic.  She currently has no fever, pain, urinary symptoms, or nausea. She plans to monitor her blood pressure at home using her mother's blood pressure monitor.    Past Medical History:  Diagnosis Date   Endometrial polyp 03/17/2019   Hypertension    Morbid obesity (HCC)    Pre-diabetes    per patient    Past Surgical History:  Procedure Laterality Date   HYSTEROSCOPY WITH D & C N/A 04/08/2019   Procedure: DILATATION AND CURETTAGE /HYSTEROSCOPY WITH ENDOMETRIAL POLYPECTOMY;  Surgeon: Lorence Ozell CROME, MD;  Location: [facility] OR;  Service: Gynecology;  Laterality: N/A;   NO PAST SURGERIES      Family History  Problem Relation Age of Onset   Hypertension Mother    Diabetes Mother    Diabetes Father    Hypertension Sister     Hypertension Sister     Social History   Socioeconomic History   Marital status: Single    Spouse name: Not on file   Number of children: Not on file   Years of education: Not on file   Highest education level: Not on file  Occupational History   Not on file  Tobacco Use   Smoking status: Never   Smokeless tobacco: Never  Vaping Use   Vaping status: Never Used  Substance and Sexual Activity   Alcohol use: Yes    Comment: occasionally   Drug use: No   Sexual activity: Not Currently    Birth control/protection: None  Other Topics Concern   Not on file  Social History Narrative   ** Merged History Encounter **       Social Drivers of Health   Financial Resource Strain: High Risk (12/05/2023)   Overall Financial Resource Strain (CARDIA)    Difficulty of Paying Living Expenses: Very hard  Food Insecurity: No Food Insecurity (04/22/2024)   Hunger Vital Sign    Worried About Running Out of Food in the Last Year: Never true    Ran Out of Food in the Last Year: Never true  Transportation Needs: No Transportation Needs (04/22/2024)   PRAPARE - Administrator, Civil Service (Medical): No    Lack of Transportation (Non-Medical): No  Physical Activity: Not on file  Stress: No Stress Concern Present (02/07/2024)   Received from [facility] of Occupational Health - Occupational  Stress Questionnaire    Do you feel stress - tense, restless, nervous, or anxious, or unable to sleep at night because your mind is troubled all the time - these days?: Not at all  Recent Concern: Stress - Stress Concern Present (12/04/2023)   [employer] of Occupational Health - Occupational Stress Questionnaire    Feeling of Stress: Very much  Social Connections: Socially Isolated (12/04/2023)   Social Connection and Isolation Panel    Frequency of Communication with Friends and Family: More than three times a week    Frequency of Social Gatherings with Friends and  Family: Once a week    Attends Religious Services: Never    Database Administrator or Organizations: No    Attends Engineer, Structural: Never    Marital Status: Never married    No Known Allergies  Outpatient Medications Prior to Visit  Medication Sig Dispense Refill   Blood Pressure Monitoring (BLOOD PRESSURE CUFF) MISC 1 each by Does not apply route daily. 1 each 0   cyclobenzaprine  (FLEXERIL ) 5 MG tablet Take 1-2 tablets (5-10 mg dose) by mouth 3 (three) times a day as needed for Muscle spasms for up to 10 days. 60 tablet 0   doxycycline  (MONODOX ) 100 MG capsule Take 100 mg by mouth 2 (two) times daily.     DULoxetine  (CYMBALTA ) 30 MG capsule Take 1 capsule (30 mg total) by mouth daily. 30 capsule 3   ibuprofen  (ADVIL ) 600 MG tablet Take 1 tablet (600 mg total) by mouth every 6 (six) hours as needed for pain. (Patient taking differently: Take 800 mg by mouth every 8 (eight) hours as needed.) 30 tablet 0   amLODipine  (NORVASC ) 10 MG tablet Take 1 tablet (10 mg total) by mouth in the morning and at bedtime. 90 tablet 1   labetalol  (NORMODYNE ) 200 MG tablet Take 1 tablet (200 mg total) by mouth 3 (three) times daily. (Patient not taking: Reported on 04/29/2024) 180 tablet 1   meloxicam  (MOBIC ) 15 MG tablet Take 1 tablet (15 mg total) by mouth daily. (Patient not taking: Reported on 04/29/2024) 30 tablet 0   methocarbamol  (ROBAXIN ) 500 MG tablet Take 1 tablet (500 mg total) by mouth 3 (three) times daily. (Patient not taking: Reported on 04/29/2024) 90 tablet 0   metroNIDAZOLE  (FLAGYL ) 500 MG tablet Take 500 mg by mouth 2 (two) times daily. (Patient not taking: Reported on 04/29/2024)     olopatadine  (PATADAY ) 0.1 % ophthalmic solution Place 1 drop into both eyes 2 (two) times daily. (Patient not taking: Reported on 04/29/2024) 5 mL 0   predniSONE  (DELTASONE ) 20 MG tablet Take 1 tablet (20 mg total) by mouth daily with breakfast. (Patient not taking: Reported on 04/29/2024) 10 tablet 0    Semaglutide -Weight Management 2.4 MG/0.75ML SOAJ Inject 2.4 mg into the skin once a week. (Patient not taking: Reported on 04/29/2024) 3 mL 6   traMADol  (ULTRAM ) 50 MG tablet Take 1 tablet (50 mg total) by mouth every 6 (six) hours as needed. (Patient not taking: Reported on 04/29/2024) 30 tablet 0   No facility-administered medications prior to visit.     ROS Review of Systems  Constitutional:  Negative for activity change and appetite change.  HENT:  Negative for sinus pressure and sore throat.   Respiratory:  Negative for chest tightness, shortness of breath and wheezing.   Cardiovascular:  Negative for chest pain and palpitations.  Gastrointestinal:  Negative for abdominal distention, abdominal pain and constipation.  Genitourinary: Negative.  Musculoskeletal: Negative.   Psychiatric/Behavioral:  Negative for behavioral problems and dysphoric mood.     Objective:  BP 122/80   Pulse 90   Temp 98.1 F (36.7 C) (Oral)   Ht 5' 8 (1.727 m)   Wt 295 lb 12.8 oz (134.2 kg)   LMP 04/06/2024 (Exact Date)   SpO2 99%   BMI 44.98 kg/m      04/29/2024    3:22 PM 04/22/2024    3:08 PM 01/12/2024    3:49 PM  BP/Weight  Systolic BP 122 126 170  Diastolic BP 80 91 130  Wt. (Lbs) 295.8 260   BMI 44.98 kg/m2 39.53 kg/m2       Physical Exam Constitutional:      Appearance: She is well-developed.  Cardiovascular:     Rate and Rhythm: Normal rate.     Heart sounds: Normal heart sounds. No murmur heard. Pulmonary:     Effort: Pulmonary effort is normal.     Breath sounds: Normal breath sounds. No wheezing or rales.  Chest:     Chest wall: No tenderness.  Abdominal:     General: Bowel sounds are normal. There is no distension.     Palpations: Abdomen is soft. There is no mass.     Tenderness: There is no abdominal tenderness.  Musculoskeletal:        General: Normal range of motion.     Right lower leg: No edema.     Left lower leg: No edema.  Neurological:     Mental  Status: She is alert and oriented to person, place, and time.  Psychiatric:        Mood and Affect: Mood normal.        Latest Ref Rng & Units 12/04/2023   11:42 AM 04/11/2023   11:15 AM 11/08/2022    4:12 PM  CMP  Glucose 70 - 99 mg/dL 865  81  883   BUN 6 - 20 mg/dL 10  8  13    Creatinine 0.57 - 1.00 mg/dL 9.24  9.21  9.22   Sodium 134 - 144 mmol/L 141  140  140   Potassium 3.5 - 5.2 mmol/L 3.4  3.9  4.0   Chloride 96 - 106 mmol/L 103  105  103   CO2 20 - 29 mmol/L 21  19  23    Calcium 8.7 - 10.2 mg/dL 9.2  9.2  9.7   Total Protein 6.0 - 8.5 g/dL 7.5  7.4    Total Bilirubin 0.0 - 1.2 mg/dL <9.7  0.3    Alkaline Phos 44 - 121 IU/L 81  74    AST 0 - 40 IU/L 15  18    ALT 0 - 32 IU/L 19  26      Lipid Panel     Component Value Date/Time   CHOL 178 12/04/2023 1142   TRIG 83 12/04/2023 1142   HDL 39 (L) 12/04/2023 1142   CHOLHDL 4.3 02/17/2021 1510   LDLCALC 123 (H) 12/04/2023 1142    CBC    Component Value Date/Time   WBC 11.2 (H) 04/11/2023 1115   WBC 12.9 (H) 04/02/2022 1702   RBC 4.37 04/11/2023 1115   RBC 4.29 04/02/2022 1702   HGB 13.2 04/11/2023 1115   HCT 40.7 04/11/2023 1115   PLT 279 04/11/2023 1115   MCV 93 04/11/2023 1115   MCH 30.2 04/11/2023 1115   MCH 31.2 04/02/2022 1702   MCHC 32.4 04/11/2023 1115   MCHC 34.0 04/02/2022  1702   RDW 13.1 04/11/2023 1115   LYMPHSABS 2.2 04/11/2023 1115   MONOABS 0.9 04/02/2022 1702   EOSABS 0.1 04/11/2023 1115   BASOSABS 0.0 04/11/2023 1115    Lab Results  Component Value Date   HGBA1C 5.4 12/04/2023       Assessment & Plan Essential hypertension Blood pressure currently well-controlled. Amlodipine  adjusted to once daily. Labetalol  regimen maintained. - Changed amlodipine  to once daily. - Monitor blood pressure at home using her mother's blood pressure monitor. - Consider increasing labetalol  if blood pressure rises. - Send MyChart message with blood pressure readings after one week. -Counseled on blood  pressure goal of less than 130/80, low-sodium, DASH diet, medication compliance, 150 minutes of moderate intensity exercise per week. Discussed medication compliance, adverse effects.   Tubo-ovarian abscess Previously treated with antibiotics. Currently asymptomatic, indicating resolution. - Ordered pelvic ultrasound to follow up. - Monitor for recurrence of symptoms such as fever or pain.       Meds ordered this encounter  Medications   amLODipine  (NORVASC ) 10 MG tablet    Sig: Take 1 tablet (10 mg total) by mouth daily.    Dispense:  90 tablet    Refill:  1    Follow-up: Return in about 3 months (around 07/28/2024) for Chronic medical conditions.       Corrina Sabin, MD, FAAFP. [facility] [place], [place] [phone]   04/29/2024, 6:13 PM

## 2024-04-29 NOTE — Patient Instructions (Signed)
 VISIT SUMMARY:  Today, you came in for a reemployment clearance after your job physical showed elevated blood pressure. You mentioned that you had skipped your morning blood pressure medication on the day of the physical. Currently, you are not experiencing any symptoms.  YOUR PLAN:  -ESSENTIAL HYPERTENSION: Essential hypertension means high blood pressure without a known secondary cause. Your blood pressure is currently well-controlled. We have adjusted your amlodipine  to once daily and kept your labetalol  regimen the same. Please monitor your blood pressure at home using your mother's blood pressure monitor and send a MyChart message with your readings after one week. If your blood pressure rises, we may consider increasing your labetalol  dosage.  -TUBO-OVARIAN ABSCESS: A tubo-ovarian abscess is a collection of pus that forms in the fallopian tubes and ovaries, usually due to infection. You were previously treated with antibiotics and are currently asymptomatic, indicating that the infection has resolved. We have ordered a pelvic ultrasound to follow up and ensure everything is clear. Please monitor for any recurrence of symptoms such as fever or pain.  INSTRUCTIONS:  Please monitor your blood pressure at home and send a MyChart message with your readings after one week. Additionally, keep an eye out for any symptoms such as fever or pain and inform us  immediately if they occur. We have also ordered a pelvic ultrasound to follow up on your previous tubo-ovarian abscess.

## 2024-04-30 ENCOUNTER — Other Ambulatory Visit: Payer: Self-pay

## 2024-05-02 ENCOUNTER — Other Ambulatory Visit: Payer: Self-pay

## 2024-05-08 ENCOUNTER — Inpatient Hospital Stay: Admission: RE | Admit: 2024-05-08 | Discharge: 2024-05-08 | Attending: Family Medicine | Admitting: Family Medicine

## 2024-05-08 DIAGNOSIS — N7093 Salpingitis and oophoritis, unspecified: Secondary | ICD-10-CM | POA: Diagnosis not present

## 2024-05-12 ENCOUNTER — Ambulatory Visit: Admitting: Family Medicine

## 2024-05-14 ENCOUNTER — Ambulatory Visit: Payer: Self-pay | Admitting: Family Medicine

## 2024-05-21 ENCOUNTER — Other Ambulatory Visit: Payer: Self-pay

## 2024-07-28 ENCOUNTER — Ambulatory Visit: Admitting: Family Medicine
